# Patient Record
Sex: Female | Born: 1955 | Race: Black or African American | Hispanic: No | Marital: Married | Smoking: Former smoker
Health system: Southern US, Community
[De-identification: ages and names within clinical notes are randomized; demographics above are authoritative.]

## PROBLEM LIST (undated history)

## (undated) DIAGNOSIS — I10 Essential (primary) hypertension: Secondary | ICD-10-CM

## (undated) DIAGNOSIS — C539 Malignant neoplasm of cervix uteri, unspecified: Secondary | ICD-10-CM

## (undated) DIAGNOSIS — R1900 Intra-abdominal and pelvic swelling, mass and lump, unspecified site: Secondary | ICD-10-CM

## (undated) DIAGNOSIS — T7840XA Allergy, unspecified, initial encounter: Secondary | ICD-10-CM

## (undated) DIAGNOSIS — Z860100 Personal history of colon polyps, unspecified: Secondary | ICD-10-CM

## (undated) DIAGNOSIS — J309 Allergic rhinitis, unspecified: Secondary | ICD-10-CM

## (undated) DIAGNOSIS — N943 Premenstrual tension syndrome: Secondary | ICD-10-CM

## (undated) DIAGNOSIS — E785 Hyperlipidemia, unspecified: Secondary | ICD-10-CM

## (undated) HISTORY — PX: ABDOMINAL HYSTERECTOMY: SHX81

## (undated) HISTORY — PX: OTHER SURGICAL HISTORY: SHX169

## (undated) HISTORY — PX: COLONOSCOPY: SHX174

## (undated) HISTORY — DX: Personal history of colon polyps, unspecified: Z86.0100

## (undated) HISTORY — DX: Malignant neoplasm of cervix uteri, unspecified: C53.9

## (undated) HISTORY — DX: Premenstrual tension syndrome: N94.3

## (undated) HISTORY — DX: Essential (primary) hypertension: I10

## (undated) HISTORY — PX: POLYPECTOMY: SHX149

## (undated) HISTORY — DX: Hyperlipidemia, unspecified: E78.5

## (undated) HISTORY — DX: Personal history of colonic polyps: Z86.010

## (undated) HISTORY — PX: URETERAL REIMPLANTION: SHX2611

## (undated) HISTORY — DX: Allergy, unspecified, initial encounter: T78.40XA

---

## 1987-08-08 DIAGNOSIS — C539 Malignant neoplasm of cervix uteri, unspecified: Secondary | ICD-10-CM

## 1987-08-08 HISTORY — DX: Malignant neoplasm of cervix uteri, unspecified: C53.9

## 1999-04-13 ENCOUNTER — Other Ambulatory Visit: Admission: RE | Admit: 1999-04-13 | Discharge: 1999-04-13 | Payer: Self-pay | Admitting: Family Medicine

## 1999-05-16 ENCOUNTER — Encounter: Admission: RE | Admit: 1999-05-16 | Discharge: 1999-05-16 | Payer: Self-pay | Admitting: Family Medicine

## 2000-04-12 ENCOUNTER — Other Ambulatory Visit: Admission: RE | Admit: 2000-04-12 | Discharge: 2000-04-12 | Payer: Self-pay | Admitting: Family Medicine

## 2000-05-18 ENCOUNTER — Encounter: Admission: RE | Admit: 2000-05-18 | Discharge: 2000-05-18 | Payer: Self-pay | Admitting: Family Medicine

## 2000-05-18 ENCOUNTER — Encounter: Payer: Self-pay | Admitting: Family Medicine

## 2001-05-21 ENCOUNTER — Encounter: Payer: Self-pay | Admitting: Family Medicine

## 2001-05-21 ENCOUNTER — Encounter: Admission: RE | Admit: 2001-05-21 | Discharge: 2001-05-21 | Payer: Self-pay | Admitting: Family Medicine

## 2001-06-04 ENCOUNTER — Other Ambulatory Visit: Admission: RE | Admit: 2001-06-04 | Discharge: 2001-06-04 | Payer: Self-pay | Admitting: Family Medicine

## 2002-05-23 ENCOUNTER — Encounter: Admission: RE | Admit: 2002-05-23 | Discharge: 2002-05-23 | Payer: Self-pay | Admitting: Family Medicine

## 2002-05-23 ENCOUNTER — Encounter: Payer: Self-pay | Admitting: Family Medicine

## 2002-06-10 ENCOUNTER — Other Ambulatory Visit: Admission: RE | Admit: 2002-06-10 | Discharge: 2002-06-10 | Payer: Self-pay | Admitting: Family Medicine

## 2003-05-28 ENCOUNTER — Encounter: Payer: Self-pay | Admitting: Family Medicine

## 2003-05-28 ENCOUNTER — Encounter: Admission: RE | Admit: 2003-05-28 | Discharge: 2003-05-28 | Payer: Self-pay | Admitting: Family Medicine

## 2003-06-10 ENCOUNTER — Other Ambulatory Visit: Admission: RE | Admit: 2003-06-10 | Discharge: 2003-06-10 | Payer: Self-pay | Admitting: Family Medicine

## 2004-05-30 ENCOUNTER — Encounter: Admission: RE | Admit: 2004-05-30 | Discharge: 2004-05-30 | Payer: Self-pay | Admitting: Family Medicine

## 2004-06-13 ENCOUNTER — Ambulatory Visit: Payer: Self-pay | Admitting: Family Medicine

## 2004-06-20 ENCOUNTER — Other Ambulatory Visit: Admission: RE | Admit: 2004-06-20 | Discharge: 2004-06-20 | Payer: Self-pay | Admitting: Family Medicine

## 2004-06-20 ENCOUNTER — Ambulatory Visit: Payer: Self-pay | Admitting: Family Medicine

## 2004-06-21 ENCOUNTER — Encounter: Admission: RE | Admit: 2004-06-21 | Discharge: 2004-06-21 | Payer: Self-pay | Admitting: Family Medicine

## 2004-06-27 ENCOUNTER — Ambulatory Visit: Payer: Self-pay | Admitting: Family Medicine

## 2004-08-18 ENCOUNTER — Ambulatory Visit: Payer: Self-pay | Admitting: Family Medicine

## 2004-08-19 ENCOUNTER — Ambulatory Visit: Payer: Self-pay | Admitting: Gastroenterology

## 2004-09-01 ENCOUNTER — Ambulatory Visit: Payer: Self-pay | Admitting: Gastroenterology

## 2005-06-01 ENCOUNTER — Encounter: Admission: RE | Admit: 2005-06-01 | Discharge: 2005-06-01 | Payer: Self-pay | Admitting: Family Medicine

## 2005-10-10 ENCOUNTER — Ambulatory Visit: Payer: Self-pay | Admitting: Family Medicine

## 2005-10-17 ENCOUNTER — Other Ambulatory Visit: Admission: RE | Admit: 2005-10-17 | Discharge: 2005-10-17 | Payer: Self-pay | Admitting: Family Medicine

## 2005-10-17 ENCOUNTER — Encounter (INDEPENDENT_AMBULATORY_CARE_PROVIDER_SITE_OTHER): Payer: Self-pay | Admitting: Specialist

## 2005-10-17 ENCOUNTER — Ambulatory Visit: Payer: Self-pay | Admitting: Family Medicine

## 2005-11-20 ENCOUNTER — Ambulatory Visit: Payer: Self-pay | Admitting: Family Medicine

## 2005-11-27 ENCOUNTER — Ambulatory Visit: Payer: Self-pay | Admitting: Family Medicine

## 2005-12-25 ENCOUNTER — Emergency Department (HOSPITAL_COMMUNITY): Admission: EM | Admit: 2005-12-25 | Discharge: 2005-12-25 | Payer: Self-pay | Admitting: Emergency Medicine

## 2006-02-22 ENCOUNTER — Ambulatory Visit: Payer: Self-pay | Admitting: Family Medicine

## 2006-05-16 ENCOUNTER — Ambulatory Visit: Payer: Self-pay | Admitting: Family Medicine

## 2006-05-22 ENCOUNTER — Ambulatory Visit: Payer: Self-pay | Admitting: Licensed Clinical Social Worker

## 2006-05-23 ENCOUNTER — Ambulatory Visit: Payer: Self-pay | Admitting: Family Medicine

## 2006-06-04 ENCOUNTER — Encounter: Admission: RE | Admit: 2006-06-04 | Discharge: 2006-06-04 | Payer: Self-pay | Admitting: Family Medicine

## 2006-06-05 ENCOUNTER — Ambulatory Visit: Payer: Self-pay | Admitting: Licensed Clinical Social Worker

## 2006-06-07 ENCOUNTER — Ambulatory Visit: Payer: Self-pay | Admitting: Family Medicine

## 2006-10-19 ENCOUNTER — Ambulatory Visit: Payer: Self-pay | Admitting: Family Medicine

## 2006-10-19 LAB — CONVERTED CEMR LAB
Alkaline Phosphatase: 68 units/L (ref 39–117)
BUN: 14 mg/dL (ref 6–23)
Basophils Relative: 0.8 % (ref 0.0–1.0)
Bilirubin, Direct: 0.1 mg/dL (ref 0.0–0.3)
CO2: 27 meq/L (ref 19–32)
Cholesterol: 193 mg/dL (ref 0–200)
GFR calc Af Amer: 115 mL/min
HDL: 36.8 mg/dL — ABNORMAL LOW (ref >39.0)
Hemoglobin: 14.1 g/dL (ref 12.0–15.0)
Lymphocytes Relative: 36.1 % (ref 12.0–46.0)
MCHC: 34.1 g/dL (ref 30.0–36.0)
Monocytes Absolute: 0.4 10*3/uL (ref 0.2–0.7)
Monocytes Relative: 5.3 % (ref 3.0–11.0)
Neutro Abs: 4.1 10*3/uL (ref 1.4–7.7)
Potassium: 3.8 meq/L (ref 3.5–5.1)
Sodium: 143 meq/L (ref 135–145)
TSH: 0.86 microintl units/mL (ref 0.35–5.50)
Total Protein: 6.7 g/dL (ref 6.0–8.3)
VLDL: 30 mg/dL (ref 0–40)

## 2006-12-03 ENCOUNTER — Ambulatory Visit: Payer: Self-pay | Admitting: Family Medicine

## 2007-05-14 DIAGNOSIS — J309 Allergic rhinitis, unspecified: Secondary | ICD-10-CM

## 2007-05-14 DIAGNOSIS — E785 Hyperlipidemia, unspecified: Secondary | ICD-10-CM | POA: Insufficient documentation

## 2007-05-14 DIAGNOSIS — I10 Essential (primary) hypertension: Secondary | ICD-10-CM | POA: Insufficient documentation

## 2007-05-14 HISTORY — DX: Allergic rhinitis, unspecified: J30.9

## 2007-06-10 ENCOUNTER — Encounter: Admission: RE | Admit: 2007-06-10 | Discharge: 2007-06-10 | Payer: Self-pay | Admitting: Family Medicine

## 2007-11-26 ENCOUNTER — Ambulatory Visit: Payer: Self-pay | Admitting: Family Medicine

## 2007-11-26 LAB — CONVERTED CEMR LAB
Basophils Absolute: 0 10*3/uL (ref 0.0–0.1)
Basophils Relative: 0.2 % (ref 0.0–1.0)
Bilirubin Urine: NEGATIVE
Bilirubin, Direct: 0.1 mg/dL (ref 0.0–0.3)
Calcium: 9.7 mg/dL (ref 8.4–10.5)
Cholesterol: 233 mg/dL (ref 0–200)
Creatinine, Ser: 0.8 mg/dL (ref 0.4–1.2)
Direct LDL: 136.7 mg/dL
GFR calc Af Amer: 98 mL/min
Glucose, Urine, Semiquant: NEGATIVE
HCT: 43.6 % (ref 36.0–46.0)
Hemoglobin: 14.8 g/dL (ref 12.0–15.0)
MCHC: 33.9 g/dL (ref 30.0–36.0)
MCV: 91.2 fL (ref 78.0–100.0)
Monocytes Absolute: 0.5 10*3/uL (ref 0.1–1.0)
Neutro Abs: 4.8 10*3/uL (ref 1.4–7.7)
Protein, U semiquant: NEGATIVE
RDW: 12.9 % (ref 11.5–14.6)
Sodium: 141 meq/L (ref 135–145)
Specific Gravity, Urine: 1.02
TSH: 1.39 microintl units/mL (ref 0.35–5.50)
Total Bilirubin: 0.9 mg/dL (ref 0.3–1.2)
Total Protein: 7.7 g/dL (ref 6.0–8.3)
pH: 7

## 2007-12-05 ENCOUNTER — Ambulatory Visit: Payer: Self-pay | Admitting: Family Medicine

## 2008-04-06 ENCOUNTER — Telehealth (INDEPENDENT_AMBULATORY_CARE_PROVIDER_SITE_OTHER): Payer: Self-pay | Admitting: *Deleted

## 2008-06-10 ENCOUNTER — Encounter: Admission: RE | Admit: 2008-06-10 | Discharge: 2008-06-10 | Payer: Self-pay | Admitting: Family Medicine

## 2008-11-30 ENCOUNTER — Ambulatory Visit: Payer: Self-pay | Admitting: Family Medicine

## 2008-11-30 LAB — CONVERTED CEMR LAB
AST: 21 units/L (ref 0–37)
Albumin: 3.8 g/dL (ref 3.5–5.2)
BUN: 13 mg/dL (ref 6–23)
Basophils Absolute: 0.1 10*3/uL (ref 0.0–0.1)
Blood in Urine, dipstick: NEGATIVE
CO2: 26 meq/L (ref 19–32)
Chloride: 112 meq/L (ref 96–112)
Direct LDL: 127.9 mg/dL
Eosinophils Absolute: 0.1 10*3/uL (ref 0.0–0.7)
GFR calc non Af Amer: 113.76 mL/min (ref >60)
Glucose, Bld: 88 mg/dL (ref 70–99)
Glucose, Urine, Semiquant: NEGATIVE
HCT: 39.9 % (ref 36.0–46.0)
HDL: 34.8 mg/dL — ABNORMAL LOW (ref >39.00)
Hemoglobin: 13.5 g/dL (ref 12.0–15.0)
Lymphs Abs: 2.2 10*3/uL (ref 0.7–4.0)
MCHC: 33.9 g/dL (ref 30.0–36.0)
MCV: 92.7 fL (ref 78.0–100.0)
Monocytes Absolute: 0.4 10*3/uL (ref 0.1–1.0)
Monocytes Relative: 6.5 % (ref 3.0–12.0)
Neutro Abs: 4 10*3/uL (ref 1.4–7.7)
Nitrite: NEGATIVE
Platelets: 244 10*3/uL (ref 150.0–400.0)
Potassium: 3.8 meq/L (ref 3.5–5.1)
RDW: 13 % (ref 11.5–14.6)
Sodium: 143 meq/L (ref 135–145)
Specific Gravity, Urine: 1.02
TSH: 0.56 microintl units/mL (ref 0.35–5.50)
Total Bilirubin: 0.6 mg/dL (ref 0.3–1.2)
WBC Urine, dipstick: NEGATIVE
pH: 5

## 2008-12-07 ENCOUNTER — Ambulatory Visit: Payer: Self-pay | Admitting: Family Medicine

## 2009-02-24 ENCOUNTER — Ambulatory Visit: Payer: Self-pay | Admitting: Internal Medicine

## 2009-02-24 DIAGNOSIS — Z860100 Personal history of colon polyps, unspecified: Secondary | ICD-10-CM | POA: Insufficient documentation

## 2009-02-24 DIAGNOSIS — R12 Heartburn: Secondary | ICD-10-CM

## 2009-04-01 ENCOUNTER — Ambulatory Visit: Payer: Self-pay | Admitting: Internal Medicine

## 2009-06-11 ENCOUNTER — Encounter: Admission: RE | Admit: 2009-06-11 | Discharge: 2009-06-11 | Payer: Self-pay | Admitting: Family Medicine

## 2009-12-08 ENCOUNTER — Ambulatory Visit: Payer: Self-pay | Admitting: Family Medicine

## 2009-12-08 LAB — CONVERTED CEMR LAB
ALT: 44 units/L — ABNORMAL HIGH (ref 0–35)
AST: 37 units/L (ref 0–37)
BUN: 13 mg/dL (ref 6–23)
Basophils Relative: 0.2 % (ref 0.0–3.0)
Bilirubin, Direct: 0.1 mg/dL (ref 0.0–0.3)
Chloride: 110 meq/L (ref 96–112)
Direct LDL: 119.1 mg/dL
Eosinophils Relative: 2.5 % (ref 0.0–5.0)
GFR calc non Af Amer: 97.11 mL/min (ref >60)
HCT: 41 % (ref 36.0–46.0)
Leukocytes, UA: NEGATIVE
Lymphs Abs: 2 10*3/uL (ref 0.7–4.0)
Monocytes Relative: 6.3 % (ref 3.0–12.0)
Neutrophils Relative %: 64.6 % (ref 43.0–77.0)
Nitrite: NEGATIVE
Platelets: 259 10*3/uL (ref 150.0–400.0)
Potassium: 4.2 meq/L (ref 3.5–5.1)
RBC: 4.54 M/uL (ref 3.87–5.11)
Specific Gravity, Urine: >=1.03 (ref 1.000–1.030)
Total Bilirubin: 0.5 mg/dL (ref 0.3–1.2)
Total CHOL/HDL Ratio: 6
Total Protein: 6.9 g/dL (ref 6.0–8.3)
Urobilinogen, UA: 0.2 (ref 0.0–1.0)
VLDL: 43.6 mg/dL — ABNORMAL HIGH (ref 0.0–40.0)
WBC: 7.6 10*3/uL (ref 4.5–10.5)

## 2009-12-14 ENCOUNTER — Ambulatory Visit: Payer: Self-pay | Admitting: Family Medicine

## 2010-06-17 ENCOUNTER — Encounter: Admission: RE | Admit: 2010-06-17 | Discharge: 2010-06-17 | Payer: Self-pay | Admitting: Family Medicine

## 2010-09-06 NOTE — Assessment & Plan Note (Signed)
Summary: cpx/pap/cjr   Vital Signs:  Patient profile:   55 year old female Menstrual status:  hysterectomy Height:      63.5 inches Weight:      149 pounds BMI:     26.07 Temp:     97.7 degrees F oral BP sitting:   140 / 92  (left arm) Cuff size:   regular  Vitals Entered By: Kern Reap CMA Duncan Dull) (Dec 14, 2009 9:39 AM) CC: cpx   Primary Care Provider:  Kelle Darting, MD  CC:  cpx.  History of Present Illness: Anita Whitehead is a 55 year old female, nonsmoker, who comes in today for general physical examination  She has a history of underlying allergic rhinitis, for which he takes Zyrtec 10 mg nightly.  She also has some reflux esophagitis, for which he takes Zantac 150 mg b.i.d. p.r.n.  Future [place], care, and dental care, does BSE monthly.  Gets annual mammography.  Colonoscopy normal at age 30.  Tetanus 2008 declines seasonal flu shot.  Allergies: No Known Drug Allergies  Past History:  Past medical, surgical, family and social histories (including risk factors) reviewed, and no changes noted (except as noted below).  Past Medical History: Reviewed history from 02/24/2009 and no changes required. PMS Allergic rhinitis Hyperlipidemia Hypertension TAH/BSO for nonmalignant reasons childbirth x 2 Adenomatous Colon Polyps  Past Surgical History: Reviewed history from 05/14/2007 and no changes required. TAH/BSO CB x2 Colonoscopy  Family History: Reviewed history from 02/24/2009 and no changes required. Family History Breast cancer 1st degree relative <50 Family History Diabetes 1st degree relative Family History Hypertension Family History Lung cancer Family History Uterine cancer Family History of Respiratory disease No FH of Colon Cancer:  Social History: Reviewed history from 02/24/2009 and no changes required. Occupation:Social Services  Married Never Smoked Alcohol use-no Regular exercise-yes one son and a [place] in jail for drug issues.  Due to get out  a year plus.  Other son in college playing soccer  Review of Systems      See HPI  Physical Exam  General:  Well-developed,well-nourished,in no acute distress; alert,appropriate and cooperative throughout examination Head:  Normocephalic and atraumatic without obvious abnormalities. No apparent alopecia or balding. Eyes:  No corneal or conjunctival inflammation noted. EOMI. Perrla. Funduscopic exam benign, without hemorrhages, exudates or papilledema. Vision grossly normal. Ears:  External ear exam shows no significant lesions or deformities.  Otoscopic examination reveals clear canals, tympanic membranes are intact bilaterally without bulging, retraction, inflammation or discharge. Hearing is grossly normal bilaterally. Nose:  External nasal examination shows no deformity or inflammation. Nasal mucosa are pink and moist without lesions or exudates. Mouth:  Oral mucosa and oropharynx without lesions or exudates.  Teeth in good repair. Neck:  No deformities, masses, or tenderness noted. Chest Wall:  No deformities, masses, or tenderness noted. Breasts:  No mass, nodules, thickening, tenderness, bulging, retraction, inflamation, nipple discharge or skin changes noted.   Lungs:  Normal respiratory effort, chest expands symmetrically. Lungs are clear to auscultation, no crackles or wheezes. Heart:  Normal rate and regular rhythm. S1 and S2 normal without gallop, murmur, click, rub or other extra sounds. Abdomen:  Bowel sounds positive,abdomen soft and non-tender without masses, organomegaly or hernias noted. Rectal:  No external abnormalities noted. Normal sphincter tone. No rectal masses or tenderness. Genitalia:  Pelvic Exam:        External: normal female genitalia without lesions or masses        Vagina: normal without lesions or masses  Cervix: normal without lesions or masses        Adnexa: normal bimanual exam without masses or fullness        Uterus: normal by palpation        Pap  smear: not performed Msk:  No deformity or scoliosis noted of thoracic or lumbar spine.   Pulses:  R and L carotid,radial,femoral,dorsalis pedis and posterior tibial pulses are full and equal bilaterally Extremities:  No clubbing, cyanosis, edema, or deformity noted with normal full range of motion of all joints.   Neurologic:  No cranial nerve deficits noted. Station and gait are normal. Plantar reflexes are down-going bilaterally. DTRs are symmetrical throughout. Sensory, motor and coordinative functions appear intact. Skin:  Intact without suspicious lesions or rashes Cervical Nodes:  No lymphadenopathy noted Axillary Nodes:  No palpable lymphadenopathy Inguinal Nodes:  No significant adenopathy Psych:  Cognition and judgment appear intact. Alert and cooperative with normal attention span and concentration. No apparent delusions, illusions, hallucinations   Impression & Recommendations:  Problem # 1:  Preventive Health Care (ICD-V70.0) Assessment Unchanged  Complete Medication List: 1)  Adult Aspirin Low Strength 81 Mg Tbdp (Aspirin) .... Take one tablet daily 2)  Zyrtec Allergy 10 Mg Tabs (Cetirizine hcl) .... Once daily 3)  Ranitidine Hcl 150 Mg Tabs (Ranitidine hcl) .Marland Kitchen.. 1 by mouth once daily to two times a day for heartburn  Patient Instructions: 1)  Please schedule a follow-up appointment in 1 year. Prescriptions: RANITIDINE HCL 150 MG  TABS (RANITIDINE HCL) 1 by mouth once daily to two times a day for heartburn  #100 x 3   Entered and Authorized by:   Roderick Pee MD   Signed by:   Roderick Pee MD on 12/14/2009   Method used:   Print then Give to Patient   RxID:   [id number]

## 2010-12-12 ENCOUNTER — Other Ambulatory Visit (INDEPENDENT_AMBULATORY_CARE_PROVIDER_SITE_OTHER): Admitting: Family Medicine

## 2010-12-12 ENCOUNTER — Other Ambulatory Visit (INDEPENDENT_AMBULATORY_CARE_PROVIDER_SITE_OTHER): Payer: 59

## 2010-12-12 ENCOUNTER — Other Ambulatory Visit: Payer: Self-pay | Admitting: Family Medicine

## 2010-12-12 DIAGNOSIS — Z0000 Encounter for general adult medical examination without abnormal findings: Secondary | ICD-10-CM

## 2010-12-12 DIAGNOSIS — Z13220 Encounter for screening for lipoid disorders: Secondary | ICD-10-CM

## 2010-12-12 LAB — URINALYSIS
Hgb urine dipstick: NEGATIVE
Total Protein, Urine: NEGATIVE
Urine Glucose: NEGATIVE

## 2010-12-12 LAB — CBC WITH DIFFERENTIAL/PLATELET
Basophils Absolute: 0 10*3/uL (ref 0.0–0.1)
HCT: 43.8 % (ref 36.0–46.0)
Lymphs Abs: 1.8 10*3/uL (ref 0.7–4.0)
MCV: 90.8 fl (ref 78.0–100.0)
Monocytes Absolute: 0.5 10*3/uL (ref 0.1–1.0)
Monocytes Relative: 5.9 % (ref 3.0–12.0)
Platelets: 256 10*3/uL (ref 150.0–400.0)
RDW: 14 % (ref 11.5–14.6)

## 2010-12-12 LAB — BASIC METABOLIC PANEL
Calcium: 9.6 mg/dL (ref 8.4–10.5)
GFR: 120.77 mL/min (ref >60.00)
Glucose, Bld: 84 mg/dL (ref 70–99)
Sodium: 140 mEq/L (ref 135–145)

## 2010-12-12 LAB — LIPID PANEL
Cholesterol: 248 mg/dL — ABNORMAL HIGH (ref 0–200)
HDL: 40.2 mg/dL (ref >39.00)
Triglycerides: 238 mg/dL — ABNORMAL HIGH (ref 0.0–149.0)

## 2010-12-12 LAB — HEPATIC FUNCTION PANEL
ALT: 27 U/L (ref 0–35)
AST: 18 U/L (ref 0–37)
Total Bilirubin: 0.7 mg/dL (ref 0.3–1.2)

## 2010-12-12 LAB — TSH: TSH: 1.03 u[IU]/mL (ref 0.35–5.50)

## 2010-12-12 LAB — LDL CHOLESTEROL, DIRECT: Direct LDL: 161 mg/dL

## 2010-12-16 ENCOUNTER — Encounter: Payer: Self-pay | Admitting: Family Medicine

## 2010-12-19 ENCOUNTER — Encounter: Payer: Self-pay | Admitting: Family Medicine

## 2010-12-19 ENCOUNTER — Ambulatory Visit (INDEPENDENT_AMBULATORY_CARE_PROVIDER_SITE_OTHER): Admitting: Family Medicine

## 2010-12-19 VITALS — BP 120/84 | Temp 97.7°F | Ht 63.5 in | Wt 151.0 lb

## 2010-12-19 DIAGNOSIS — N6320 Unspecified lump in the left breast, unspecified quadrant: Secondary | ICD-10-CM

## 2010-12-19 DIAGNOSIS — N63 Unspecified lump in unspecified breast: Secondary | ICD-10-CM

## 2010-12-19 NOTE — Progress Notes (Signed)
  Subjective:    Patient ID: Anita Whitehead, female    DOB: [birth year], 55 y.o.   MRN: [id number]  HPI  Anita Whitehead Is a delightful, 55 year old female, nonsmoker comes in today for general medical examination.  She said a history of borderline hypertension.  BP today 120/84.  She's had a history of borderline hyperlipidemia.  Lipids are at goal.  The only medicine she takes is an aspirin tablet and Zyrtec 10 mg nightly for allergic rhinitis.  She's had her uterus and ovaries removed for nonmalignant reasons.  Childbirth x 2.  One son is in business school at [employer]. The other son Anita Whitehead is soon to get out of jail in [place] in the next 6 months.  She says she feels well.  No problems.  She gets routine eye care, dental care, BSE monthly, and mammography, second, colonoscopy 2000 and because of a history of colon polyps, tetanus, 2008.    Review of Systems  Constitutional: Negative.   HENT: Negative.   Eyes: Negative.   Respiratory: Negative.   Cardiovascular: Negative.   Gastrointestinal: Negative.   Genitourinary: Negative.   Musculoskeletal: Negative.   Neurological: Negative.   Hematological: Negative.   Psychiatric/Behavioral: Negative.        Objective:   Physical Exam  Constitutional: She appears well-developed and well-nourished.  HENT:  Head: Normocephalic and atraumatic.  Right Ear: External ear normal.  Left Ear: External ear normal.  Nose: Nose normal.  Mouth/Throat: Oropharynx is clear and moist.  Eyes: EOM are normal. Pupils are equal, round, and reactive to light.  Neck: Normal range of motion. Neck supple. No thyromegaly present.  Cardiovascular: Normal rate, regular rhythm, normal heart sounds and intact distal pulses.  Exam reveals no gallop and no friction rub.   No murmur heard. Pulmonary/Chest: Effort normal and breath sounds normal.  Abdominal: Soft. Bowel sounds are normal. She exhibits no distension and no mass. There is no tenderness. There is no  rebound.  Genitourinary: Vagina normal and uterus normal. Guaiac negative stool. No vaginal discharge found.       Bilateral breast exam shows the right breast to be normal.  There is a 6-mm times 6-mm, soft, movable cystic lesion on the left breast at 4 o'clock, right next to the nipple.  It is soft to rubbery movable.  Parenthetically, a sister, age 64, with recently diagnosed with breast cancer.  Mother had a lumpectomy.  No cancer  Musculoskeletal: Normal range of motion.  Lymphadenopathy:    She has no cervical adenopathy.  Neurological: She is alert. She has normal reflexes. No cranial nerve deficit. She exhibits normal muscle tone. Coordination normal.  Skin: Skin is warm and dry.  Psychiatric: She has a normal mood and affect. Her behavior is normal. Judgment and thought content normal.          Assessment & Plan:  Healthy female.  Allergic rhinitis.  Continue Zyrtec 10 mg nightly  Breast lump, left, was set up for ultrasound for diagnostic evaluation

## 2010-12-19 NOTE — Patient Instructions (Signed)
Continued good health habits.  Member to walk daily and avoid salt.  We will set her up for an ultrasound for evaluation of the cystic lesion in your left breast.  Follow-up in one year or sooner if any problems

## 2010-12-20 ENCOUNTER — Other Ambulatory Visit: Payer: Self-pay | Admitting: Family Medicine

## 2010-12-23 ENCOUNTER — Ambulatory Visit
Admission: RE | Admit: 2010-12-23 | Discharge: 2010-12-23 | Disposition: A | Discharge status: Home / Self Care | Source: Ambulatory Visit | Attending: Family Medicine | Admitting: Family Medicine

## 2011-06-08 ENCOUNTER — Other Ambulatory Visit: Payer: Self-pay | Admitting: Family Medicine

## 2011-06-08 DIAGNOSIS — Z1231 Encounter for screening mammogram for malignant neoplasm of breast: Secondary | ICD-10-CM

## 2011-06-21 ENCOUNTER — Ambulatory Visit
Admission: RE | Admit: 2011-06-21 | Discharge: 2011-06-21 | Disposition: A | Discharge status: Home / Self Care | Payer: 59 | Source: Ambulatory Visit | Attending: Family Medicine | Admitting: Family Medicine

## 2011-06-21 DIAGNOSIS — Z1231 Encounter for screening mammogram for malignant neoplasm of breast: Secondary | ICD-10-CM

## 2012-03-26 ENCOUNTER — Other Ambulatory Visit (INDEPENDENT_AMBULATORY_CARE_PROVIDER_SITE_OTHER): Payer: 59

## 2012-03-26 DIAGNOSIS — Z0000 Encounter for general adult medical examination without abnormal findings: Secondary | ICD-10-CM

## 2012-03-26 DIAGNOSIS — Z13220 Encounter for screening for lipoid disorders: Secondary | ICD-10-CM

## 2012-03-26 LAB — POCT URINALYSIS DIPSTICK
Bilirubin, UA: NEGATIVE
Glucose, UA: NEGATIVE
Leukocytes, UA: NEGATIVE
Nitrite, UA: NEGATIVE

## 2012-03-26 LAB — CBC WITH DIFFERENTIAL/PLATELET
Basophils Absolute: 0 10*3/uL (ref 0.0–0.1)
Lymphocytes Relative: 23.8 % (ref 12.0–46.0)
Lymphs Abs: 2.3 10*3/uL (ref 0.7–4.0)
Monocytes Relative: 4.6 % (ref 3.0–12.0)
Platelets: 250 10*3/uL (ref 150.0–400.0)
RDW: 13.9 % (ref 11.5–14.6)

## 2012-03-26 LAB — HEPATIC FUNCTION PANEL
AST: 23 U/L (ref 0–37)
Alkaline Phosphatase: 85 U/L (ref 39–117)
Total Bilirubin: 0.7 mg/dL (ref 0.3–1.2)

## 2012-03-26 LAB — BASIC METABOLIC PANEL
BUN: 9 mg/dL (ref 6–23)
Calcium: 9.7 mg/dL (ref 8.4–10.5)
GFR: 96.26 mL/min (ref >60.00)
Glucose, Bld: 98 mg/dL (ref 70–99)

## 2012-03-26 LAB — LIPID PANEL
Cholesterol: 225 mg/dL — ABNORMAL HIGH (ref 0–200)
Total CHOL/HDL Ratio: 5

## 2012-03-26 LAB — LDL CHOLESTEROL, DIRECT: Direct LDL: 127.2 mg/dL

## 2012-04-02 ENCOUNTER — Encounter: Payer: Self-pay | Admitting: Family Medicine

## 2012-04-02 ENCOUNTER — Ambulatory Visit (INDEPENDENT_AMBULATORY_CARE_PROVIDER_SITE_OTHER): Payer: 59 | Admitting: Family Medicine

## 2012-04-02 VITALS — BP 124/78 | Temp 98.2°F | Ht 63.25 in | Wt 146.0 lb

## 2012-04-02 DIAGNOSIS — N952 Postmenopausal atrophic vaginitis: Secondary | ICD-10-CM

## 2012-04-02 DIAGNOSIS — R12 Heartburn: Secondary | ICD-10-CM

## 2012-04-02 DIAGNOSIS — Z0000 Encounter for general adult medical examination without abnormal findings: Secondary | ICD-10-CM

## 2012-04-02 MED ORDER — ESTRADIOL 0.1 MG/GM VA CREA: 2.0000 g | TOPICAL_CREAM | Freq: Every day | VAGINAL | Status: AC

## 2012-04-02 NOTE — Patient Instructions (Signed)
Use small amounts of a hormonal cream twice weekly  Continue other medications  Followup in 1 year sooner if any problems

## 2012-04-02 NOTE — Progress Notes (Signed)
  Subjective:    Patient ID: Anita Whitehead, female    DOB: October 25, 1955, 56 y.o.   MRN: [id number]  HPI Anita Whitehead is a 56 year old female who comes in today for general physical examination  She takes Zantac 150 mg twice a day for reflux esophagitis and a baby aspirin for cardiac protection  She gets routine eye care, dental care, BSE monthly, and you mammography, colonoscopy x3 in GI, tetanus 2008   Review of Systems  Constitutional: Negative.   HENT: Negative.   Eyes: Negative.   Respiratory: Negative.   Cardiovascular: Negative.   Gastrointestinal: Negative.   Genitourinary: Negative.   Musculoskeletal: Negative.   Neurological: Negative.   Hematological: Negative.   Psychiatric/Behavioral: Negative.        Objective:   Physical Exam  Constitutional: She appears well-developed and well-nourished.  HENT:  Head: Normocephalic and atraumatic.  Right Ear: External ear normal.  Left Ear: External ear normal.  Nose: Nose normal.  Mouth/Throat: Oropharynx is clear and moist.  Eyes: EOM are normal. Pupils are equal, round, and reactive to light.  Neck: Normal range of motion. Neck supple. No thyromegaly present.  Cardiovascular: Normal rate, regular rhythm, normal heart sounds and intact distal pulses.  Exam reveals no gallop and no friction rub.   No murmur heard. Pulmonary/Chest: Effort normal and breath sounds normal.  Abdominal: Soft. Bowel sounds are normal. She exhibits no distension and no mass. There is no tenderness. There is no rebound.  Genitourinary: Vagina normal. Guaiac negative stool. No vaginal discharge found.       Bilateral breast exam normal extremely dry vagina  Musculoskeletal: Normal range of motion.  Lymphadenopathy:    She has no cervical adenopathy.  Neurological: She is alert. She has normal reflexes. No cranial nerve deficit. She exhibits normal muscle tone. Coordination normal.  Skin: Skin is warm and dry.  Psychiatric: She has a normal mood and  affect. Her behavior is normal. Judgment and thought content normal.          Assessment & Plan:  Healthy female  Reflux esophagitis continue OTC Zantac  Atrophic vaginitis hormonal cream twice weekly return in one year sooner if any problems

## 2012-05-14 ENCOUNTER — Other Ambulatory Visit: Payer: Self-pay | Admitting: Family Medicine

## 2012-05-14 DIAGNOSIS — Z1231 Encounter for screening mammogram for malignant neoplasm of breast: Secondary | ICD-10-CM

## 2012-06-21 ENCOUNTER — Ambulatory Visit
Admission: RE | Admit: 2012-06-21 | Discharge: 2012-06-21 | Disposition: A | Discharge status: Home / Self Care | Payer: 59 | Source: Ambulatory Visit | Attending: Family Medicine | Admitting: Family Medicine

## 2012-06-21 DIAGNOSIS — Z1231 Encounter for screening mammogram for malignant neoplasm of breast: Secondary | ICD-10-CM

## 2013-03-27 ENCOUNTER — Other Ambulatory Visit (INDEPENDENT_AMBULATORY_CARE_PROVIDER_SITE_OTHER): Payer: 59

## 2013-03-27 DIAGNOSIS — Z0000 Encounter for general adult medical examination without abnormal findings: Secondary | ICD-10-CM

## 2013-03-27 LAB — POCT URINALYSIS DIPSTICK
Glucose, UA: NEGATIVE
Nitrite, UA: NEGATIVE
Protein, UA: NEGATIVE
Spec Grav, UA: 1.02
Urobilinogen, UA: 0.2

## 2013-03-27 LAB — CBC WITH DIFFERENTIAL/PLATELET
Eosinophils Relative: 1.1 % (ref 0.0–5.0)
HCT: 41.9 % (ref 36.0–46.0)
Hemoglobin: 14.4 g/dL (ref 12.0–15.0)
Lymphs Abs: 2.5 10*3/uL (ref 0.7–4.0)
Monocytes Relative: 5.3 % (ref 3.0–12.0)
Neutro Abs: 5.6 10*3/uL (ref 1.4–7.7)
WBC: 8.7 10*3/uL (ref 4.5–10.5)

## 2013-03-27 LAB — BASIC METABOLIC PANEL
GFR: 110.05 mL/min (ref >60.00)
Potassium: 3.9 mEq/L (ref 3.5–5.1)
Sodium: 137 mEq/L (ref 135–145)

## 2013-03-27 LAB — TSH: TSH: 0.75 u[IU]/mL (ref 0.35–5.50)

## 2013-03-27 LAB — LIPID PANEL
HDL: 42.8 mg/dL (ref >39.00)
VLDL: 34.8 mg/dL (ref 0.0–40.0)

## 2013-03-27 LAB — HEPATIC FUNCTION PANEL
ALT: 24 U/L (ref 0–35)
AST: 21 U/L (ref 0–37)
Alkaline Phosphatase: 68 U/L (ref 39–117)
Total Bilirubin: 0.7 mg/dL (ref 0.3–1.2)

## 2013-04-01 ENCOUNTER — Encounter: Payer: Self-pay | Admitting: Family

## 2013-04-01 ENCOUNTER — Ambulatory Visit (INDEPENDENT_AMBULATORY_CARE_PROVIDER_SITE_OTHER): Payer: 59 | Admitting: Family

## 2013-04-01 VITALS — BP 120/80 | HR 83 | Ht 63.5 in | Wt 143.0 lb

## 2013-04-01 DIAGNOSIS — Z23 Encounter for immunization: Secondary | ICD-10-CM

## 2013-04-01 DIAGNOSIS — Z0000 Encounter for general adult medical examination without abnormal findings: Secondary | ICD-10-CM

## 2013-04-01 MED ORDER — RANITIDINE HCL 300 MG PO TABS: 300.0000 mg | ORAL_TABLET | Freq: Every day | ORAL | Status: DC

## 2013-04-01 NOTE — Progress Notes (Signed)
Subjective:    Patient ID: Anita Whitehead, female    DOB: Mar 19, 1956, 57 y.o.   MRN: [id number]  HPI 57 year old AAF, nonsmoker, patient of Dr. Tawanna Cooler is in for pphysical examination for this healthy  Female. Reviewed all health maintenance protocols including mammography colonoscopy bone density and reviewed appropriate screening labs. Her immunization history was reviewed as well as her current medications and allergies refills of her chronic medications were given and the plan for yearly health maintenance was discussed all orders and referrals were made as appropriate.   Review of Systems  Constitutional: Negative.   HENT: Negative.   Eyes: Negative.   Respiratory: Negative.   Cardiovascular: Negative.   Gastrointestinal: Negative.   Endocrine: Negative.   Genitourinary: Negative.   Musculoskeletal: Negative.   Skin: Negative.   Allergic/Immunologic: Negative.   Neurological: Negative.   Hematological: Negative.   Psychiatric/Behavioral: Negative.    Past Medical History  Diagnosis Date  . PMS (premenstrual syndrome)   . Allergy   . Hyperlipidemia   . Hypertension     History   Social History  . Marital Status: Married    Spouse Name: N/A    Number of Children: N/A  . Years of Education: N/A   Occupational History  . Not on file.   Social History Main Topics  . Smoking status: Never Smoker   . Smokeless tobacco: Not on file  . Alcohol Use:   . Drug Use:   . Sexual Activity:    Other Topics Concern  . Not on file   Social History Narrative  . No narrative on file    Past Surgical History  Procedure Laterality Date  . Abdominal hysterectomy    . Adenomatous colon polyps      Family History  Problem Relation Age of Onset  . Cancer Other     breast,lung,uterine,colon  . Diabetes Other   . Hypertension Other   . Lung disease Other     No Known Allergies  Current Outpatient Prescriptions on File Prior to Visit  Medication Sig Dispense Refill   . aspirin 81 MG tablet Take 81 mg by mouth daily.        Marland Kitchen estradiol (ESTRACE VAGINAL) 0.1 MG/GM vaginal cream Place 0.25 Applicatorfuls vaginally daily.  42.5 g  12   No current facility-administered medications on file prior to visit.    BP 120/80  Pulse 83  Ht 5' 3.5" (1.613 m)  Wt 143 lb (64.864 kg)  BMI 24.93 kg/m2chart    Objective:   Physical Exam  Constitutional: She is oriented to person, place, and time. She appears well-developed and well-nourished.  HENT:  Right Ear: External ear normal.  Left Ear: External ear normal.  Nose: Nose normal.  Mouth/Throat: Oropharynx is clear and moist.  Eyes: Conjunctivae and EOM are normal. Pupils are equal, round, and reactive to light.  Neck: Normal range of motion. Neck supple. No thyromegaly present.  Cardiovascular: Normal rate, regular rhythm and normal heart sounds.   Pulmonary/Chest: Effort normal and breath sounds normal.  Abdominal: Soft. Bowel sounds are normal. She exhibits no distension. There is no tenderness. There is no rebound.  Musculoskeletal: She exhibits edema.  Neurological: She is alert and oriented to person, place, and time. She has normal reflexes. She displays normal reflexes. No cranial nerve deficit. Coordination normal.  Skin: Skin is warm and dry. No erythema.  Psychiatric: She has a normal mood and affect.  Assessment & Plan:  Assessment: 1. CPX 2. Hypercholesterolemia  Plan: Encouraged healthy, exercise, monthly self breast exams. Low cholesterol diet. Follow up in one year and sooner as needed.

## 2013-04-01 NOTE — Patient Instructions (Addendum)

## 2013-06-13 ENCOUNTER — Other Ambulatory Visit: Payer: Self-pay

## 2013-06-13 DIAGNOSIS — Z1231 Encounter for screening mammogram for malignant neoplasm of breast: Secondary | ICD-10-CM

## 2013-07-10 ENCOUNTER — Ambulatory Visit
Admission: RE | Admit: 2013-07-10 | Discharge: 2013-07-10 | Disposition: A | Discharge status: Home / Self Care | Payer: 59 | Source: Ambulatory Visit

## 2013-07-10 DIAGNOSIS — Z1231 Encounter for screening mammogram for malignant neoplasm of breast: Secondary | ICD-10-CM

## 2013-10-01 ENCOUNTER — Telehealth: Payer: Self-pay | Admitting: Family Medicine

## 2013-10-01 NOTE — Telephone Encounter (Signed)
Fax was received denying ranitidine, medication is excluded from patient's plan.  I called to see if there were any alternatives available and I was advised that there wasn't anything listed and for the patient to call member services on the back of their card.

## 2013-10-01 NOTE — Telephone Encounter (Signed)
Patient will call insurance company

## 2013-12-14 ENCOUNTER — Ambulatory Visit (INDEPENDENT_AMBULATORY_CARE_PROVIDER_SITE_OTHER): Payer: 59 | Admitting: Physician Assistant

## 2013-12-14 VITALS — BP 130/80 | HR 96 | Temp 98.6°F | Resp 16 | Ht 63.0 in | Wt 143.0 lb

## 2013-12-14 DIAGNOSIS — R05 Cough: Secondary | ICD-10-CM

## 2013-12-14 DIAGNOSIS — R059 Cough, unspecified: Secondary | ICD-10-CM

## 2013-12-14 DIAGNOSIS — J3489 Other specified disorders of nose and nasal sinuses: Secondary | ICD-10-CM

## 2013-12-14 DIAGNOSIS — J329 Chronic sinusitis, unspecified: Secondary | ICD-10-CM

## 2013-12-14 DIAGNOSIS — R0981 Nasal congestion: Secondary | ICD-10-CM

## 2013-12-14 MED ORDER — AMOXICILLIN 875 MG PO TABS: 875.0000 mg | ORAL_TABLET | Freq: Two times a day (BID) | ORAL | Status: DC

## 2013-12-14 MED ORDER — IPRATROPIUM BROMIDE 0.03 % NA SOLN: 2.0000 | Freq: Two times a day (BID) | NASAL | Status: DC

## 2013-12-14 MED ORDER — HYDROCODONE-HOMATROPINE 5-1.5 MG/5ML PO SYRP: 5.0000 mL | ORAL_SOLUTION | Freq: Three times a day (TID) | ORAL | Status: DC | PRN

## 2013-12-14 NOTE — Progress Notes (Signed)
   Subjective:    Patient ID: Graylin Shiver, female    DOB: 1955/08/13, 58 y.o.   MRN: [id number]  HPI 58 year old female presents for evaluation of 4 day history of nasal congestion, sore throat, sinus pressure, PND, and nonproductive cough.  Symptoms have been progressively worsening.  She has been taking Mucinex and Zyrtec which do not seem to be helping.  Denies chest pain, SOB, wheezing, hemoptysis, fever, chills, nausea, vomiting, headache, or dizziness. Hx of seasonal allergies.  Patient is otherwise doing well with no other concerns today.     Review of Systems  Constitutional: Negative for fever and chills.  HENT: Positive for congestion, postnasal drip, rhinorrhea, sinus pressure and sore throat.   Respiratory: Positive for cough. Negative for chest tightness, shortness of breath and wheezing.   Gastrointestinal: Negative for nausea and vomiting.  Neurological: Negative for dizziness and headaches.       Objective:   Physical Exam  Constitutional: She is oriented to person, place, and time. She appears well-developed and well-nourished.  HENT:  Head: Normocephalic and atraumatic.  Right Ear: Hearing, tympanic membrane, external ear and ear canal normal.  Left Ear: Hearing, tympanic membrane, external ear and ear canal normal.  Mouth/Throat: Uvula is midline, oropharynx is clear and moist and mucous membranes are normal.  Eyes: Conjunctivae are normal.  Neck: Normal range of motion.  Cardiovascular: Normal rate, regular rhythm and normal heart sounds.   Pulmonary/Chest: Effort normal and breath sounds normal.  Lymphadenopathy:    She has no cervical adenopathy.  Neurological: She is alert and oriented to person, place, and time.  Psychiatric: She has a normal mood and affect. Her behavior is normal. Judgment and thought content normal.          Assessment & Plan:  Sinusitis - Plan: amoxicillin (AMOXIL) 875 MG tablet, DISCONTINUED: amoxicillin (AMOXIL) 875 MG  tablet  Cough - Plan: HYDROcodone-homatropine (HYCODAN) 5-1.5 MG/5ML syrup  Nasal congestion - Plan: ipratropium (ATROVENT) 0.03 % nasal spray, DISCONTINUED: ipratropium (ATROVENT) 0.03 % nasal spray  Will treat sinusitis with amoxicillin 875 mg bid  Atrovent NS twice daily to help with congestion Hycodan qhs prn cough.  Push fluids Continue Mucinex as directed Follow up if symptoms worsen or fail to improve.

## 2014-02-09 ENCOUNTER — Telehealth: Payer: Self-pay | Admitting: Family Medicine

## 2014-02-09 MED ORDER — RANITIDINE HCL 300 MG PO TABS
300.0000 mg | ORAL_TABLET | Freq: Every day | ORAL | Status: DC
Start: 2014-02-09 — End: 2014-05-06

## 2014-02-09 NOTE — Telephone Encounter (Signed)
[id number] [address], [address] Is requesting re-fill on ranitidine (ZANTAC) 300 MG tablet

## 2014-02-09 NOTE — Telephone Encounter (Signed)
Rx sent to pharmacy   

## 2014-03-26 ENCOUNTER — Other Ambulatory Visit: Payer: 59 | Admitting: Family Medicine

## 2014-03-26 ENCOUNTER — Other Ambulatory Visit (INDEPENDENT_AMBULATORY_CARE_PROVIDER_SITE_OTHER): Payer: 59

## 2014-03-26 DIAGNOSIS — Z0000 Encounter for general adult medical examination without abnormal findings: Secondary | ICD-10-CM

## 2014-03-26 LAB — CBC WITH DIFFERENTIAL/PLATELET
BASOS ABS: 0 10*3/uL (ref 0.0–0.1)
Basophils Relative: 0.4 % (ref 0.0–3.0)
Eosinophils Absolute: 0.1 10*3/uL (ref 0.0–0.7)
Eosinophils Relative: 1.5 % (ref 0.0–5.0)
HCT: 39.4 % (ref 36.0–46.0)
Hemoglobin: 13.1 g/dL (ref 12.0–15.0)
Lymphocytes Relative: 32.1 % (ref 12.0–46.0)
Lymphs Abs: 2.4 10*3/uL (ref 0.7–4.0)
MCHC: 33.3 g/dL (ref 30.0–36.0)
MCV: 92.4 fl (ref 78.0–100.0)
MONOS PCT: 6.2 % (ref 3.0–12.0)
Monocytes Absolute: 0.5 10*3/uL (ref 0.1–1.0)
Neutro Abs: 4.4 10*3/uL (ref 1.4–7.7)
Neutrophils Relative %: 59.8 % (ref 43.0–77.0)
PLATELETS: 255 10*3/uL (ref 150.0–400.0)
RBC: 4.27 Mil/uL (ref 3.87–5.11)
RDW: 13.6 % (ref 11.5–15.5)
WBC: 7.4 10*3/uL (ref 4.0–10.5)

## 2014-03-26 LAB — LIPID PANEL
CHOLESTEROL: 195 mg/dL (ref 0–200)
Cholesterol: 203 mg/dL — ABNORMAL HIGH (ref 0–200)
HDL: 42.2 mg/dL (ref >39.00)
HDL: 38.8 mg/dL — AB (ref >39.00)
LDL CALC: 126 mg/dL — AB (ref 0–99)
NONHDL: 164.2
NonHDL: 152.8
TRIGLYCERIDES: 134 mg/dL (ref 0.0–149.0)
Total CHOL/HDL Ratio: 5
Total CHOL/HDL Ratio: 5
Triglycerides: 259 mg/dL — ABNORMAL HIGH (ref 0.0–149.0)
VLDL: 26.8 mg/dL (ref 0.0–40.0)
VLDL: 51.8 mg/dL — AB (ref 0.0–40.0)

## 2014-03-26 LAB — BASIC METABOLIC PANEL
BUN: 12 mg/dL (ref 6–23)
BUN: 12 mg/dL (ref 6–23)
CHLORIDE: 108 meq/L (ref 96–112)
CO2: 27 mEq/L (ref 19–32)
CO2: 25 mEq/L (ref 19–32)
CREATININE: 0.8 mg/dL (ref 0.4–1.2)
CREATININE: 0.7 mg/dL (ref 0.4–1.2)
Calcium: 9.4 mg/dL (ref 8.4–10.5)
Calcium: 9.5 mg/dL (ref 8.4–10.5)
Chloride: 109 mEq/L (ref 96–112)
GFR: 90.31 mL/min (ref >60.00)
GFR: 104.54 mL/min (ref >60.00)
Glucose, Bld: 70 mg/dL (ref 70–99)
Glucose, Bld: 77 mg/dL (ref 70–99)
Potassium: 4.3 mEq/L (ref 3.5–5.1)
Potassium: 3.8 mEq/L (ref 3.5–5.1)
Sodium: 142 mEq/L (ref 135–145)
Sodium: 140 mEq/L (ref 135–145)

## 2014-03-26 LAB — POCT URINALYSIS DIPSTICK
Bilirubin, UA: NEGATIVE
Blood, UA: NEGATIVE
Glucose, UA: NEGATIVE
KETONES UA: NEGATIVE
Leukocytes, UA: NEGATIVE
Nitrite, UA: NEGATIVE
Protein, UA: NEGATIVE
SPEC GRAV UA: 1.015
Urobilinogen, UA: 0.2
pH, UA: 6.5

## 2014-03-26 LAB — HEPATIC FUNCTION PANEL
ALK PHOS: 66 U/L (ref 39–117)
ALK PHOS: 68 U/L (ref 39–117)
ALT: 21 U/L (ref 0–35)
ALT: 23 U/L (ref 0–35)
AST: 18 U/L (ref 0–37)
AST: 18 U/L (ref 0–37)
Albumin: 4 g/dL (ref 3.5–5.2)
Albumin: 4 g/dL (ref 3.5–5.2)
BILIRUBIN DIRECT: 0 mg/dL (ref 0.0–0.3)
BILIRUBIN TOTAL: 0.5 mg/dL (ref 0.2–1.2)
Bilirubin, Direct: 0 mg/dL (ref 0.0–0.3)
Total Bilirubin: 0.5 mg/dL (ref 0.2–1.2)
Total Protein: 7.1 g/dL (ref 6.0–8.3)
Total Protein: 7.1 g/dL (ref 6.0–8.3)

## 2014-03-26 LAB — TSH
TSH: 0.29 u[IU]/mL — ABNORMAL LOW (ref 0.35–4.50)
TSH: 0.41 u[IU]/mL (ref 0.35–4.50)

## 2014-03-26 LAB — LDL CHOLESTEROL, DIRECT: Direct LDL: 130.6 mg/dL

## 2014-03-26 NOTE — Addendum Note (Signed)
Addended by: Elmer Picker on: 03/26/2014 03:19 PM   Modules accepted: Orders

## 2014-04-02 ENCOUNTER — Encounter: Payer: 59 | Admitting: Family Medicine

## 2014-04-20 ENCOUNTER — Encounter: Payer: Self-pay | Admitting: Internal Medicine

## 2014-05-04 ENCOUNTER — Encounter: Payer: Self-pay | Admitting: Internal Medicine

## 2014-05-06 ENCOUNTER — Other Ambulatory Visit (HOSPITAL_COMMUNITY)
Admission: RE | Admit: 2014-05-06 | Discharge: 2014-05-06 | Disposition: A | Discharge status: Home / Self Care | Payer: 59 | Source: Ambulatory Visit | Attending: Family Medicine | Admitting: Family Medicine

## 2014-05-06 ENCOUNTER — Ambulatory Visit (INDEPENDENT_AMBULATORY_CARE_PROVIDER_SITE_OTHER): Payer: 59 | Admitting: Family Medicine

## 2014-05-06 ENCOUNTER — Encounter: Payer: Self-pay | Admitting: Family Medicine

## 2014-05-06 VITALS — BP 140/96 | Temp 98.1°F | Ht 64.0 in | Wt 147.0 lb

## 2014-05-06 DIAGNOSIS — J309 Allergic rhinitis, unspecified: Secondary | ICD-10-CM

## 2014-05-06 DIAGNOSIS — Z86010 Personal history of colonic polyps: Secondary | ICD-10-CM

## 2014-05-06 DIAGNOSIS — Z01419 Encounter for gynecological examination (general) (routine) without abnormal findings: Secondary | ICD-10-CM

## 2014-05-06 DIAGNOSIS — Z0000 Encounter for general adult medical examination without abnormal findings: Secondary | ICD-10-CM

## 2014-05-06 DIAGNOSIS — R87810 Cervical high risk human papillomavirus (HPV) DNA test positive: Secondary | ICD-10-CM | POA: Insufficient documentation

## 2014-05-06 DIAGNOSIS — Z23 Encounter for immunization: Secondary | ICD-10-CM

## 2014-05-06 DIAGNOSIS — I10 Essential (primary) hypertension: Secondary | ICD-10-CM | POA: Insufficient documentation

## 2014-05-06 DIAGNOSIS — Z1151 Encounter for screening for human papillomavirus (HPV): Secondary | ICD-10-CM | POA: Diagnosis present

## 2014-05-06 DIAGNOSIS — R030 Elevated blood-pressure reading, without diagnosis of hypertension: Secondary | ICD-10-CM

## 2014-05-06 DIAGNOSIS — R12 Heartburn: Secondary | ICD-10-CM

## 2014-05-06 MED ORDER — RANITIDINE HCL 300 MG PO TABS: 300.0000 mg | ORAL_TABLET | Freq: Every day | ORAL | Status: DC

## 2014-05-06 NOTE — Progress Notes (Signed)
Pre visit review using our clinic review tool, if applicable. No additional management support is needed unless otherwise documented below in the visit note. 

## 2014-05-06 NOTE — Progress Notes (Signed)
   Subjective:    Patient ID: Anita Whitehead, female    DOB: 09/14/1955, 58 y.o.   MRN: [id number]  HPI Anita Whitehead is a 58 year old female nonsmoker who comes in today for general physical examination  He has a history of allergic rhinitis and takes Zyrtec 10 mg daily. She also takes Zantac 300 mg daily at bedtime because of nighttime heartburn. No difficulty swallowing.  She gets routine eye care, dental care, BSE monthly, and you mammography, colonoscopy at age 13 she is due for followup because of some colon polyps.  She was diagnosed at age 39 of uterine cancer and had her uterus and ovaries removed. Because of her history of a malignancy I would recommend annual pelvic  6 vaccinations updated by Apolonio Schneiders   Review of Systems  Constitutional: Negative.   HENT: Negative.   Eyes: Negative.   Respiratory: Negative.   Cardiovascular: Negative.   Gastrointestinal: Negative.   Genitourinary: Negative.   Musculoskeletal: Negative.   Neurological: Negative.   Psychiatric/Behavioral: Negative.        Objective:   Physical Exam  Nursing note and vitals reviewed. Constitutional: She appears well-developed and well-nourished.  HENT:  Head: Normocephalic and atraumatic.  Right Ear: External ear normal.  Left Ear: External ear normal.  Nose: Nose normal.  Mouth/Throat: Oropharynx is clear and moist.  Eyes: EOM are normal. Pupils are equal, round, and reactive to light.  Neck: Normal range of motion. Neck supple. No thyromegaly present.  Cardiovascular: Normal rate, regular rhythm, normal heart sounds and intact distal pulses.  Exam reveals no gallop and no friction rub.   No murmur heard. No carotid artery bruits peripheral pulses 2+ and symmetrical  Pulmonary/Chest: Effort normal and breath sounds normal.  Abdominal: Soft. Bowel sounds are normal. She exhibits no distension and no mass. There is no tenderness. There is no rebound.  Genitourinary: Vagina normal. No vaginal discharge  found.  Bilateral breast exam normal  Examination of the suture line in her vagina is normal. Scraping was done of the surgical cuff  Musculoskeletal: Normal range of motion.  Lymphadenopathy:    She has no cervical adenopathy.  Neurological: She is alert. She has normal reflexes. No cranial nerve deficit. She exhibits normal muscle tone. Coordination normal.  Skin: Skin is warm and dry.  Psychiatric: She has a normal mood and affect. Her behavior is normal. Judgment and thought content normal.          Assessment & Plan:   healthy female  Allergic rhinitis continue Zyrtec  Nighttime reflux continues Zantac 300 mg daily  Elevated blood pressure................ followup BP........ after physical examination 150/90....... historically she's had hypertension in the past but with diet exercise and weight loss her blood pressure went back to normal and she's been off antihypertensive medications now for 2 years.  Hypertension does run in her family......... BP check daily followup in 4 weeks

## 2014-05-06 NOTE — Patient Instructions (Signed)
BP check every morning,,,,,,, followup in 4 weeks,,,,,,,,, bring the data and the device with you  Walk 30 minutes daily  No salt diet,

## 2014-05-07 ENCOUNTER — Telehealth: Payer: Self-pay | Admitting: Family Medicine

## 2014-05-07 NOTE — Telephone Encounter (Signed)
emmi emailed °

## 2014-05-08 LAB — CYTOLOGY - PAP

## 2014-06-08 ENCOUNTER — Ambulatory Visit: Payer: 59 | Admitting: Family Medicine

## 2014-06-08 ENCOUNTER — Encounter: Payer: Self-pay | Admitting: Family Medicine

## 2014-06-09 ENCOUNTER — Encounter: Payer: Self-pay | Admitting: Family Medicine

## 2014-06-09 ENCOUNTER — Ambulatory Visit (INDEPENDENT_AMBULATORY_CARE_PROVIDER_SITE_OTHER): Payer: 59 | Admitting: Family Medicine

## 2014-06-09 VITALS — BP 130/90 | Temp 98.2°F | Wt 145.0 lb

## 2014-06-09 DIAGNOSIS — R030 Elevated blood-pressure reading, without diagnosis of hypertension: Secondary | ICD-10-CM

## 2014-06-09 NOTE — Progress Notes (Signed)
Pre visit review using our clinic review tool, if applicable. No additional management support is needed unless otherwise documented below in the visit note. 

## 2014-06-09 NOTE — Patient Instructions (Signed)
66mron pump up digital blood pressure cuff.......Marland Kitchen [place] your blood pressure weekly. Blood pressure goal 135/85 or less  Return when necessary

## 2014-06-09 NOTE — Progress Notes (Signed)
   Subjective:    Patient ID: Anita Whitehead, female    DOB: 08-22-1955, 58 y.o.   MRN: [id number]  HPI Anita Whitehead is a 58 year old female who comes in today for reevaluation of high blood pressure question question  We saw her for physical examination her blood pressure was slightly elevated. She's been monitoring her blood pressure at home all her home blood pressure readings are normal.   Review of Systems Review of systems otherwise negative    Objective:   Physical Exam  Well-developed well-nourished female no acute distress vital signs stable she's afebrile BP right arm sitting position 130/90 at 3:30 in the afternoon  Morning blood pressures are all normal      Assessment & Plan:  Normotensive......... Positive family history of high blood pressure.......Marland Kitchen Monitor blood pressure weekly return when necessary if elevated

## 2014-06-24 ENCOUNTER — Other Ambulatory Visit: Payer: Self-pay

## 2014-06-24 DIAGNOSIS — Z1231 Encounter for screening mammogram for malignant neoplasm of breast: Secondary | ICD-10-CM

## 2014-07-13 ENCOUNTER — Ambulatory Visit
Admission: RE | Admit: 2014-07-13 | Discharge: 2014-07-13 | Disposition: A | Discharge status: Home / Self Care | Payer: 59 | Source: Ambulatory Visit

## 2014-07-13 DIAGNOSIS — Z1231 Encounter for screening mammogram for malignant neoplasm of breast: Secondary | ICD-10-CM

## 2015-04-27 ENCOUNTER — Ambulatory Visit (INDEPENDENT_AMBULATORY_CARE_PROVIDER_SITE_OTHER): Payer: 59 | Admitting: Adult Health

## 2015-04-27 ENCOUNTER — Encounter: Payer: Self-pay | Admitting: Adult Health

## 2015-04-27 VITALS — BP 140/100 | HR 94 | Temp 97.8°F | Wt 142.0 lb

## 2015-04-27 DIAGNOSIS — Z7689 Persons encountering health services in other specified circumstances: Secondary | ICD-10-CM

## 2015-04-27 DIAGNOSIS — I10 Essential (primary) hypertension: Secondary | ICD-10-CM | POA: Diagnosis not present

## 2015-04-27 DIAGNOSIS — Z7189 Other specified counseling: Secondary | ICD-10-CM | POA: Diagnosis not present

## 2015-04-27 LAB — BASIC METABOLIC PANEL
BUN: 15 mg/dL (ref 6–23)
CALCIUM: 9.7 mg/dL (ref 8.4–10.5)
CO2: 25 mEq/L (ref 19–32)
Chloride: 106 mEq/L (ref 96–112)
Creatinine, Ser: 0.78 mg/dL (ref 0.40–1.20)
GFR: 97.99 mL/min (ref >60.00)
GLUCOSE: 97 mg/dL (ref 70–99)
POTASSIUM: 3.8 meq/L (ref 3.5–5.1)
SODIUM: 141 meq/L (ref 135–145)

## 2015-04-27 MED ORDER — HYDROCHLOROTHIAZIDE 12.5 MG PO CAPS: 12.5000 mg | ORAL_CAPSULE | Freq: Every day | ORAL | Status: DC

## 2015-04-27 NOTE — Progress Notes (Signed)
Pre visit review using our clinic review tool, if applicable. No additional management support is needed unless otherwise documented below in the visit note. 

## 2015-04-27 NOTE — Progress Notes (Signed)
HPI:  Anita Whitehead is here to establish care. She is a very pleasant AA female who  has a past medical history of PMS (premenstrual syndrome); Allergy; Hyperlipidemia; Hypertension; and Cervical cancer (1989).  Last PCP and physical: 03/2014 with MD Sherren Mocha Immunizations UTD: Diet:Eats little meals throughout the day. Eats a lot of fruits and vegetables. Not a lot of fried foods.  Exercise:She walks every day.  Colonoscopy:2010 Dexa:Never Mammogram:07/2014   Has the following chronic problems that require follow up and concerns today:  Hypertension  - She has been diagnosed with HTN in the past but was able to be taken off medication due to losing weight. She has been off her medication for the last 4 years. Today in the office her blood pressure is 140/100 on two separate occassions. She does endorse elevated blood pressure readings at home, usually in the 140's. She denies any symptoms.    ROS negative for unless reported above: fevers, chills,feeling poorly, unintentional weight loss, hearing or vision loss, chest pain, palpitations, leg claudication, struggling to breath,Not feeling congested in the chest, no orthopenia, no cough,no wheezing, normal appetite, no soft tissue swelling, no hemoptysis, melena, hematochezia, hematuria, falls, loc, si, or thoughts of self harm.    Past Medical History  Diagnosis Date  . PMS (premenstrual syndrome)   . Allergy   . Hyperlipidemia   . Hypertension     Past Surgical History  Procedure Laterality Date  . Abdominal hysterectomy    . Adenomatous colon polyps      Family History  Problem Relation Age of Onset  . Cancer Other     breast,lung,uterine,colon  . Diabetes Other   . Hypertension Other   . Lung disease Other     Social History   Social History  . Marital Status: Married    Spouse Name: N/A  . Number of Children: N/A  . Years of Education: N/A   Social History Main Topics  . Smoking status: Never Smoker   .  Smokeless tobacco: None  . Alcohol Use: None  . Drug Use: None  . Sexual Activity: Not Asked   Other Topics Concern  . None   Social History Narrative     Current outpatient prescriptions:  .  aspirin 81 MG tablet, Take 81 mg by mouth daily.  , Disp: , Rfl:  .  cetirizine (ZYRTEC) 10 MG tablet, Take 10 mg by mouth daily., Disp: , Rfl:  .  ipratropium (ATROVENT) 0.03 % nasal spray, Place 2 sprays into the nose 2 (two) times daily., Disp: 30 mL, Rfl: 5  EXAM:  Filed Vitals:   04/27/15 1403  BP: 140/100  Pulse: 94  Temp: 97.8 F (36.6 C)    Body mass index is 24.36 kg/(m^2).  GENERAL: vitals reviewed and listed above, alert, oriented, appears well hydrated and in no acute distress  HEENT: atraumatic, conjunttiva clear, no obvious abnormalities on inspection of external nose and ears  NECK: Neck is soft and supple without masses, no adenopathy or thyromegaly, trachea midline, no JVD. Normal range of motion.   LUNGS: clear to auscultation bilaterally, no wheezes, rales or rhonchi, good air movement  CV: Regular rate and rhythm, normal S1/S2, no audible murmurs, gallops, or rubs. No carotid bruit and no peripheral edema.   MS: moves all extremities without noticeable abnormality. No edema noted  Abd: soft/nontender/nondistended/normal bowel sounds   Skin: warm and dry, no rash   Extremities: No clubbing, cyanosis, or edema. Capillary refill is WNL. Pulses  intact bilaterally in upper and lower extremities.   Neuro: CN II-XII intact, sensation and reflexes normal throughout, 5/5 muscle strength in bilateral upper and lower extremities. Normal finger to nose. Normal rapid alternating movements. Normal romberg. No pronator drift.   PSYCH: pleasant and cooperative, no obvious depression or anxiety  ASSESSMENT AND PLAN:  1. Essential hypertension - Due to past medical history, strong family history of HTN and her elevated blood pressure readings at home. Will trial her on  a low dose of HCTZ - hydrochlorothiazide (MICROZIDE) 12.5 MG capsule; Take 1 capsule (12.5 mg total) by mouth daily.  Dispense: 30 capsule; Refill: 3 - Basic metabolic panel  2. Encounter to establish care - Follow up with me for CPE - Follow up sooner if needed.    -We reviewed the PMH, PSH, FH, SH, Meds and Allergies. -We provided refills for any medications we will prescribe as needed. -We addressed current concerns per orders and patient instructions. -We have asked for records for pertinent exams, studies, vaccines and notes from previous providers. -We have advised patient to follow up per instructions below.   -Patient advised to return or notify a Delphin Funes immediately if symptoms worsen or persist or new concerns arise.    [name], AGNP

## 2015-04-27 NOTE — Patient Instructions (Signed)
It was great meeting you today!  I have sent in a low dose of HCTZ, this is a diuretic that should bring your blood pressure down.   Send me a note in a week with your blood pressure readings.   Follow up with me for a complete physical and let me know if you need anything.

## 2015-06-09 ENCOUNTER — Other Ambulatory Visit (INDEPENDENT_AMBULATORY_CARE_PROVIDER_SITE_OTHER): Payer: 59

## 2015-06-09 DIAGNOSIS — Z0000 Encounter for general adult medical examination without abnormal findings: Secondary | ICD-10-CM

## 2015-06-09 LAB — POCT URINALYSIS DIPSTICK
BILIRUBIN UA: NEGATIVE
GLUCOSE UA: NEGATIVE
Ketones, UA: NEGATIVE
LEUKOCYTES UA: NEGATIVE
NITRITE UA: NEGATIVE
RBC UA: NEGATIVE
Spec Grav, UA: 1.025
Urobilinogen, UA: 0.2
pH, UA: 6

## 2015-06-09 LAB — HEPATIC FUNCTION PANEL
ALT: 22 U/L (ref 0–35)
AST: 16 U/L (ref 0–37)
Albumin: 4.1 g/dL (ref 3.5–5.2)
Alkaline Phosphatase: 70 U/L (ref 39–117)
BILIRUBIN DIRECT: 0.1 mg/dL (ref 0.0–0.3)
TOTAL PROTEIN: 7.2 g/dL (ref 6.0–8.3)
Total Bilirubin: 0.5 mg/dL (ref 0.2–1.2)

## 2015-06-09 LAB — CBC WITH DIFFERENTIAL/PLATELET
BASOS PCT: 0.3 % (ref 0.0–3.0)
Basophils Absolute: 0 10*3/uL (ref 0.0–0.1)
EOS ABS: 0.1 10*3/uL (ref 0.0–0.7)
EOS PCT: 1.7 % (ref 0.0–5.0)
HEMATOCRIT: 43.4 % (ref 36.0–46.0)
HEMOGLOBIN: 14.5 g/dL (ref 12.0–15.0)
LYMPHS PCT: 33 % (ref 12.0–46.0)
Lymphs Abs: 2.8 10*3/uL (ref 0.7–4.0)
MCHC: 33.5 g/dL (ref 30.0–36.0)
MCV: 92.3 fl (ref 78.0–100.0)
MONO ABS: 0.5 10*3/uL (ref 0.1–1.0)
Monocytes Relative: 6.5 % (ref 3.0–12.0)
NEUTROS ABS: 4.9 10*3/uL (ref 1.4–7.7)
Neutrophils Relative %: 58.5 % (ref 43.0–77.0)
PLATELETS: 283 10*3/uL (ref 150.0–400.0)
RBC: 4.7 Mil/uL (ref 3.87–5.11)
RDW: 13.8 % (ref 11.5–15.5)
WBC: 8.4 10*3/uL (ref 4.0–10.5)

## 2015-06-09 LAB — LIPID PANEL
CHOL/HDL RATIO: 5
Cholesterol: 223 mg/dL — ABNORMAL HIGH (ref 0–200)
HDL: 44.9 mg/dL (ref >39.00)
LDL Cholesterol: 153 mg/dL — ABNORMAL HIGH (ref 0–99)
NonHDL: 177.75
TRIGLYCERIDES: 124 mg/dL (ref 0.0–149.0)
VLDL: 24.8 mg/dL (ref 0.0–40.0)

## 2015-06-09 LAB — BASIC METABOLIC PANEL
BUN: 11 mg/dL (ref 6–23)
CHLORIDE: 104 meq/L (ref 96–112)
CO2: 32 meq/L (ref 19–32)
CREATININE: 0.68 mg/dL (ref 0.40–1.20)
Calcium: 9.7 mg/dL (ref 8.4–10.5)
GFR: 114.75 mL/min (ref >60.00)
Glucose, Bld: 86 mg/dL (ref 70–99)
POTASSIUM: 3.7 meq/L (ref 3.5–5.1)
Sodium: 141 mEq/L (ref 135–145)

## 2015-06-09 LAB — TSH: TSH: 1.49 u[IU]/mL (ref 0.35–4.50)

## 2015-06-15 ENCOUNTER — Encounter: Payer: Self-pay | Admitting: Adult Health

## 2015-06-15 ENCOUNTER — Ambulatory Visit (INDEPENDENT_AMBULATORY_CARE_PROVIDER_SITE_OTHER): Payer: 59 | Admitting: Adult Health

## 2015-06-15 ENCOUNTER — Other Ambulatory Visit: Payer: Self-pay

## 2015-06-15 ENCOUNTER — Encounter: Payer: Self-pay | Admitting: Internal Medicine

## 2015-06-15 VITALS — BP 120/90 | HR 98 | Temp 97.8°F | Ht 63.39 in | Wt 140.8 lb

## 2015-06-15 DIAGNOSIS — Z1231 Encounter for screening mammogram for malignant neoplasm of breast: Secondary | ICD-10-CM

## 2015-06-15 DIAGNOSIS — I10 Essential (primary) hypertension: Secondary | ICD-10-CM | POA: Diagnosis not present

## 2015-06-15 DIAGNOSIS — Z0000 Encounter for general adult medical examination without abnormal findings: Secondary | ICD-10-CM | POA: Diagnosis not present

## 2015-06-15 DIAGNOSIS — Z23 Encounter for immunization: Secondary | ICD-10-CM | POA: Diagnosis not present

## 2015-06-15 MED ORDER — HYDROCHLOROTHIAZIDE 12.5 MG PO CAPS: 12.5000 mg | ORAL_CAPSULE | Freq: Every day | ORAL | Status: DC

## 2015-06-15 NOTE — Progress Notes (Signed)
Subjective:    Patient ID: Anita Whitehead, female    DOB: October 07, 1955, 59 y.o.   MRN: [id number]  HPI Patient presents for yearly preventative medicine examination.  He has a history of allergic rhinitis and takes Zyrtec 10 mg daily. She also takes Zantac 300 mg daily at bedtime because of nighttime heartburn. No difficulty swallowing.  She gets routine eye care, dental care, BSE monthly, and you mammography, colonoscopy at age 35 she is due for followup because of some colon polyps. She will schedule this.   She was diagnosed at age 40 of uterine cancer and had her uterus and ovaries removed.   All immunizations and health maintenance protocols were reviewed with the patient and needed orders were placed.  Appropriate screening laboratory values were reviewed with the patient including screening of hyperlipidemia, renal function and hepatic function.  Medication reconciliation,  past medical history, social history, problem list and allergies were reviewed in detail with the patient  Goals were established with regard to weight loss, exercise, and  diet in compliance with medications  End of life planning was discussed- she does not have a living will or advanced directive.     Review of Systems  Constitutional: Negative.   HENT: Negative.   Eyes: Negative.   Respiratory: Negative.   Cardiovascular: Negative.   Endocrine: Negative.   Genitourinary: Negative.   Musculoskeletal: Negative.   Skin: Negative.   Allergic/Immunologic: Positive for environmental allergies.  Neurological: Negative.   Hematological: Negative.   Psychiatric/Behavioral: Negative.   All other systems reviewed and are negative.  Past Medical History  Diagnosis Date  . PMS (premenstrual syndrome)   . Allergy   . Hyperlipidemia   . Hypertension   . Cervical cancer ([place]) 1989    Social History   Social History  . Marital Status: Married    Spouse Name: N/A  . Number of Children: N/A  .  Years of Education: N/A   Occupational History  . Not on file.   Social History Main Topics  . Smoking status: Never Smoker   . Smokeless tobacco: Not on file  . Alcohol Use: 0.0 oz/week    0 Standard drinks or equivalent per week     Comment: 3 beers a week and 2 cocktails during the week  . Drug Use: No  . Sexual Activity: Not on file   Other Topics Concern  . Not on file   Social History Narrative   Civil engineer, contracting for [employer]   Married for 16 years   2 boys, one in [place] and one in [place]    Past Surgical History  Procedure Laterality Date  . Abdominal hysterectomy    . Adenomatous colon polyps      Family History  Problem Relation Age of Onset  . Diabetes Brother   . Hypertension Mother   . Hypertension Sister   . Cancer Sister     Breast, uterine  . Cancer Mother     Unknown    No Known Allergies  Current Outpatient Prescriptions on File Prior to Visit  Medication Sig Dispense Refill  . aspirin 81 MG tablet Take 81 mg by mouth daily.      . cetirizine (ZYRTEC) 10 MG tablet Take 10 mg by mouth daily.    . hydrochlorothiazide (MICROZIDE) 12.5 MG capsule Take 1 capsule (12.5 mg total) by mouth daily. 30 capsule 3  . ipratropium (ATROVENT) 0.03 % nasal spray Place 2 sprays into the nose 2 (  two) times daily. 30 mL 5   No current facility-administered medications on file prior to visit.    BP 120/90 mmHg  Pulse 116  Temp(Src) 97.8 F (36.6 C) (Oral)  Ht 5' 3.39" (1.61 m)  Wt 140 lb 12.8 oz (63.866 kg)  BMI 24.64 kg/m2       Objective:   Physical Exam  Constitutional: She is oriented to person, place, and time. She appears well-developed and well-nourished. No distress.  HENT:  Head: Normocephalic and atraumatic.  Right Ear: External ear normal.  Left Ear: External ear normal.  Nose: Nose normal.  Mouth/Throat: Oropharynx is clear and moist.  Eyes: Conjunctivae and EOM are normal. Pupils are equal, round, and reactive  to light. Right eye exhibits no discharge. Left eye exhibits no discharge. No scleral icterus.  Neck: Normal range of motion. Neck supple. No JVD present. No tracheal deviation present. No thyromegaly present.  Cardiovascular: Normal rate, regular rhythm, normal heart sounds and intact distal pulses.  Exam reveals no friction rub.   No murmur heard. Pulmonary/Chest: Effort normal and breath sounds normal. No respiratory distress. She has no wheezes. She has no rales. She exhibits no tenderness.  Abdominal: Soft. Bowel sounds are normal. She exhibits no distension and no mass. There is no tenderness. There is no rebound and no guarding.  Genitourinary: Vagina normal. No vaginal discharge found.  Breast Exam: No masses, lumps, dimpling or discharge.   Musculoskeletal: Normal range of motion. She exhibits no edema or tenderness.  Lymphadenopathy:    She has no cervical adenopathy.  Neurological: She is alert and oriented to person, place, and time. She has normal reflexes. She displays normal reflexes. No cranial nerve deficit. She exhibits normal muscle tone. Coordination normal.  Skin: Skin is warm and dry. No rash noted. She is not diaphoretic. No erythema. No pallor.  Psychiatric: She has a normal mood and affect. Her behavior is normal. Judgment and thought content normal.  Nursing note and vitals reviewed.     Assessment & Plan:  1. Routine general medical examination at a health care facility - Follow up in one year for CPE - Follow up sooner if needed - Reviewed labs   2. Essential hypertension - Normotensive - hydrochlorothiazide (MICROZIDE) 12.5 MG capsule; Take 1 capsule (12.5 mg total) by mouth daily.  Dispense: 90 capsule; Refill: 3

## 2015-06-15 NOTE — Patient Instructions (Addendum)
It was great seeing you again!  Continue to do whatever you are doing!  Eat healthy and exercise.   Follow up with me in one year or sooner if needed  Health Maintenance, Female Adopting a healthy lifestyle and getting preventive care can go a long way to promote health and wellness. Talk with your health care provider about what schedule of regular examinations is right for you. This is a good chance for you to check in with your provider about disease prevention and staying healthy. In between checkups, there are plenty of things you can do on your own. Experts have done a lot of research about which lifestyle changes and preventive measures are most likely to keep you healthy. Ask your health care provider for more information. WEIGHT AND DIET  Eat a healthy diet  Be sure to include plenty of vegetables, fruits, low-fat dairy products, and lean protein.  Do not eat a lot of foods high in solid fats, added sugars, or salt.  Get regular exercise. This is one of the most important things you can do for your health.  Most adults should exercise for at least 150 minutes each week. The exercise should increase your heart rate and make you sweat (moderate-intensity exercise).  Most adults should also do strengthening exercises at least twice a week. This is in addition to the moderate-intensity exercise.  Maintain a healthy weight  Body mass index (BMI) is a measurement that can be used to identify possible weight problems. It estimates body fat based on height and weight. Your health care provider can help determine your BMI and help you achieve or maintain a healthy weight.  For females 61 years of age and older:   A BMI below 18.5 is considered underweight.  A BMI of 18.5 to 24.9 is normal.  A BMI of 25 to 29.9 is considered overweight.  A BMI of 30 and above is considered obese.  Watch levels of cholesterol and blood lipids  You should start having your blood tested for  lipids and cholesterol at 57 years of age, then have this test every 5 years.  You may need to have your cholesterol levels checked more often if:  Your lipid or cholesterol levels are high.  You are older than 57 years of age.  You are at high risk for heart disease.  CANCER SCREENING   Lung Cancer  Lung cancer screening is recommended for adults 66-56 years old who are at high risk for lung cancer because of a history of smoking.  A yearly low-dose CT scan of the lungs is recommended for people who:  Currently smoke.  Have quit within the past 15 years.  Have at least a 30-pack-year history of smoking. A pack year is smoking an average of one pack of cigarettes a day for 1 year.  Yearly screening should continue until it has been 15 years since you quit.  Yearly screening should stop if you develop a health problem that would prevent you from having lung cancer treatment.  Breast Cancer  Practice breast self-awareness. This means understanding how your breasts normally appear and feel.  It also means doing regular breast self-exams. Let your health care provider know about any changes, no matter how small.  If you are in your 20s or 30s, you should have a clinical breast exam (CBE) by a health care provider every 1-3 years as part of a regular health exam.  If you are 1 or older, have a CBE  every year. Also consider having a breast X-ray (mammogram) every year.  If you have a family history of breast cancer, talk to your health care provider about genetic screening.  If you are at high risk for breast cancer, talk to your health care provider about having an MRI and a mammogram every year.  Breast cancer gene (BRCA) assessment is recommended for women who have family members with BRCA-related cancers. BRCA-related cancers include:  Breast.  Ovarian.  Tubal.  Peritoneal cancers.  Results of the assessment will determine the need for genetic counseling and BRCA1  and BRCA2 testing. Cervical Cancer Your health care provider may recommend that you be screened regularly for cancer of the pelvic organs (ovaries, uterus, and vagina). This screening involves a pelvic examination, including checking for microscopic changes to the surface of your cervix (Pap test). You may be encouraged to have this screening done every 3 years, beginning at age 68.  For women ages 70-65, health care providers may recommend pelvic exams and Pap testing every 3 years, or they may recommend the Pap and pelvic exam, combined with testing for human papilloma virus (HPV), every 5 years. Some types of HPV increase your risk of cervical cancer. Testing for HPV may also be done on women of any age with unclear Pap test results.  Other health care providers may not recommend any screening for nonpregnant women who are considered low risk for pelvic cancer and who do not have symptoms. Ask your health care provider if a screening pelvic exam is right for you.  If you have had past treatment for cervical cancer or a condition that could lead to cancer, you need Pap tests and screening for cancer for at least 20 years after your treatment. If Pap tests have been discontinued, your risk factors (such as having a new sexual partner) need to be reassessed to determine if screening should resume. Some women have medical problems that increase the chance of getting cervical cancer. In these cases, your health care provider may recommend more frequent screening and Pap tests. Colorectal Cancer  This type of cancer can be detected and often prevented.  Routine colorectal cancer screening usually begins at 57 years of age and continues through 57 years of age.  Your health care provider may recommend screening at an earlier age if you have risk factors for colon cancer.  Your health care provider may also recommend using home test kits to check for hidden blood in the stool.  A small camera at the  end of a tube can be used to examine your colon directly (sigmoidoscopy or colonoscopy). This is done to check for the earliest forms of colorectal cancer.  Routine screening usually begins at age 73.  Direct examination of the colon should be repeated every 5-10 years through 57 years of age. However, you may need to be screened more often if early forms of precancerous polyps or small growths are found. Skin Cancer  Check your skin from head to toe regularly.  Tell your health care provider about any new moles or changes in moles, especially if there is a change in a mole's shape or color.  Also tell your health care provider if you have a mole that is larger than the size of a pencil eraser.  Always use sunscreen. Apply sunscreen liberally and repeatedly throughout the day.  Protect yourself by wearing long sleeves, pants, a wide-brimmed hat, and sunglasses whenever you are outside. HEART DISEASE, DIABETES, AND HIGH BLOOD PRESSURE  High blood pressure causes heart disease and increases the risk of stroke. High blood pressure is more likely to develop in:  People who have blood pressure in the high end of the normal range (130-139/85-89 mm Hg).  People who are overweight or obese.  People who are African American.  If you are 18-39 years of age, have your blood pressure checked every 3-5 years. If you are 40 years of age or older, have your blood pressure checked every year. You should have your blood pressure measured twice--once when you are at a hospital or clinic, and once when you are not at a hospital or clinic. Record the average of the two measurements. To check your blood pressure when you are not at a hospital or clinic, you can use:  An automated blood pressure machine at a pharmacy.  A home blood pressure monitor.  If you are between 55 years and 79 years old, ask your health care provider if you should take aspirin to prevent strokes.  Have regular diabetes  screenings. This involves taking a blood sample to check your fasting blood sugar level.  If you are at a normal weight and have a low risk for diabetes, have this test once every three years after 57 years of age.  If you are overweight and have a high risk for diabetes, consider being tested at a younger age or more often. PREVENTING INFECTION  Hepatitis B  If you have a higher risk for hepatitis B, you should be screened for this virus. You are considered at high risk for hepatitis B if:  You were born in a country where hepatitis B is common. Ask your health care provider which countries are considered high risk.  Your parents were born in a high-risk country, and you have not been immunized against hepatitis B (hepatitis B vaccine).  You have HIV or AIDS.  You use needles to inject street drugs.  You live with someone who has hepatitis B.  You have had sex with someone who has hepatitis B.  You get hemodialysis treatment.  You take certain medicines for conditions, including cancer, organ transplantation, and autoimmune conditions. Hepatitis C  Blood testing is recommended for:  Everyone born from 1945 through 1965.  Anyone with known risk factors for hepatitis C. Sexually transmitted infections (STIs)  You should be screened for sexually transmitted infections (STIs) including gonorrhea and chlamydia if:  You are sexually active and are younger than 57 years of age.  You are older than 57 years of age and your health care provider tells you that you are at risk for this type of infection.  Your sexual activity has changed since you were last screened and you are at an increased risk for chlamydia or gonorrhea. Ask your health care provider if you are at risk.  If you do not have HIV, but are at risk, it may be recommended that you take a prescription medicine daily to prevent HIV infection. This is called pre-exposure prophylaxis (PrEP). You are considered at risk  if:  You are sexually active and do not regularly use condoms or know the HIV status of your partner(s).  You take drugs by injection.  You are sexually active with a partner who has HIV. Talk with your health care provider about whether you are at high risk of being infected with HIV. If you choose to begin PrEP, you should first be tested for HIV. You should then be tested every 3 months for as   long as you are taking PrEP.  PREGNANCY   If you are premenopausal and you may become pregnant, ask your health care provider about preconception counseling.  If you may become pregnant, take 400 to 800 micrograms (mcg) of folic acid every day.  If you want to prevent pregnancy, talk to your health care provider about birth control (contraception). OSTEOPOROSIS AND MENOPAUSE   Osteoporosis is a disease in which the bones lose minerals and strength with aging. This can result in serious bone fractures. Your risk for osteoporosis can be identified using a bone density scan.  If you are 30 years of age or older, or if you are at risk for osteoporosis and fractures, ask your health care provider if you should be screened.  Ask your health care provider whether you should take a calcium or vitamin D supplement to lower your risk for osteoporosis.  Menopause may have certain physical symptoms and risks.  Hormone replacement therapy may reduce some of these symptoms and risks. Talk to your health care provider about whether hormone replacement therapy is right for you.  HOME CARE INSTRUCTIONS   Schedule regular health, dental, and eye exams.  Stay current with your immunizations.   Do not use any tobacco products including cigarettes, chewing tobacco, or electronic cigarettes.  If you are pregnant, do not drink alcohol.  If you are breastfeeding, limit how much and how often you drink alcohol.  Limit alcohol intake to no more than 1 drink per day for nonpregnant women. One drink equals 12  ounces of beer, 5 ounces of wine, or 1 ounces of hard liquor.  Do not use street drugs.  Do not share needles.  Ask your health care provider for help if you need support or information about quitting drugs.  Tell your health care provider if you often feel depressed.  Tell your health care provider if you have ever been abused or do not feel safe at home.   This information is not intended to replace advice given to you by your health care provider. Make sure you discuss any questions you have with your health care provider.   Document Released: 02/06/2011 Document Revised: 08/14/2014 Document Reviewed: 06/25/2013 Elsevier Interactive Patient Education [employer].

## 2015-06-15 NOTE — Progress Notes (Signed)
Pre visit review using our clinic review tool, if applicable. No additional management support is needed unless otherwise documented below in the visit note. 

## 2015-06-17 ENCOUNTER — Encounter: Payer: Self-pay | Admitting: Internal Medicine

## 2015-07-20 ENCOUNTER — Ambulatory Visit
Admission: RE | Admit: 2015-07-20 | Discharge: 2015-07-20 | Disposition: A | Discharge status: Home / Self Care | Payer: 59 | Source: Ambulatory Visit

## 2015-07-20 DIAGNOSIS — Z1231 Encounter for screening mammogram for malignant neoplasm of breast: Secondary | ICD-10-CM

## 2015-08-19 ENCOUNTER — Ambulatory Visit (AMBULATORY_SURGERY_CENTER): Payer: Self-pay | Admitting: *Deleted

## 2015-08-19 VITALS — Ht 62.5 in | Wt 141.0 lb

## 2015-08-19 DIAGNOSIS — Z86010 Personal history of colonic polyps: Secondary | ICD-10-CM

## 2015-08-19 NOTE — Progress Notes (Signed)
No egg or soy allergy known to patient  No issues with past sedation with any surgeries  or procedures, no intubation problems  No diet pills-per patient  No home 02 use per patient  No blood thinners but baby asa  Pt states for her IV we need to use her hand for a good vein   emmi video declined

## 2015-08-25 ENCOUNTER — Other Ambulatory Visit: Payer: Self-pay | Admitting: Adult Health

## 2015-08-25 DIAGNOSIS — I10 Essential (primary) hypertension: Secondary | ICD-10-CM

## 2015-08-25 MED ORDER — HYDROCHLOROTHIAZIDE 12.5 MG PO CAPS: 12.5000 mg | ORAL_CAPSULE | Freq: Every day | ORAL | Status: DC

## 2015-08-27 ENCOUNTER — Encounter: Payer: Self-pay | Admitting: Adult Health

## 2015-08-30 ENCOUNTER — Other Ambulatory Visit: Payer: Self-pay | Admitting: Adult Health

## 2015-08-30 DIAGNOSIS — I10 Essential (primary) hypertension: Secondary | ICD-10-CM

## 2015-08-30 MED ORDER — HYDROCHLOROTHIAZIDE 12.5 MG PO CAPS: 12.5000 mg | ORAL_CAPSULE | Freq: Every day | ORAL | Status: DC

## 2015-09-02 ENCOUNTER — Ambulatory Visit (AMBULATORY_SURGERY_CENTER): Payer: 59 | Admitting: Internal Medicine

## 2015-09-02 ENCOUNTER — Encounter: Payer: Self-pay | Admitting: Internal Medicine

## 2015-09-02 VITALS — BP 127/89 | HR 86 | Temp 98.9°F | Resp 24 | Ht 62.5 in | Wt 141.0 lb

## 2015-09-02 DIAGNOSIS — D122 Benign neoplasm of ascending colon: Secondary | ICD-10-CM

## 2015-09-02 DIAGNOSIS — Z86010 Personal history of colonic polyps: Secondary | ICD-10-CM

## 2015-09-02 DIAGNOSIS — D124 Benign neoplasm of descending colon: Secondary | ICD-10-CM

## 2015-09-02 MED ORDER — SODIUM CHLORIDE 0.9 % IV SOLN: 500.0000 mL | INTRAVENOUS | Status: DC

## 2015-09-02 NOTE — Progress Notes (Signed)
Called to room to assist during endoscopic procedure.  Patient ID and intended procedure confirmed with present staff. Received instructions for my participation in the procedure from the performing physician.  

## 2015-09-02 NOTE — Progress Notes (Signed)
No problems noted in the recovery room. maw 

## 2015-09-02 NOTE — Op Note (Signed)
[place]  [address] [place], [address]   COLONOSCOPY PROCEDURE REPORT  PATIENT: Anita Whitehead, Anita Whitehead  MR#: [id number] BIRTHDATE: 1956-04-13 , 42  yrs. old GENDER: female ENDOSCOPIST: Gatha Mayer, MD, [facility] PROCEDURE DATE:  09/02/2015 PROCEDURE:   Colonoscopy, surveillance and Colonoscopy with snare polypectomy First Screening Colonoscopy - Avg.  risk and is 50 yrs.  old or older - No.  Prior Negative Screening - Now for repeat screening. N/A  History of Adenoma - Now for follow-up colonoscopy & has been > or = to 3 yrs.  Yes hx of adenoma.  Has been 3 or more years since last colonoscopy.  Polyps removed today? Yes ASA CLASS:   Class II INDICATIONS:Surveillance due to prior colonic neoplasia and PH Colon Adenoma. MEDICATIONS: Propofol 350 mg IV and Monitored anesthesia care  DESCRIPTION OF PROCEDURE:   After the risks benefits and alternatives of the procedure were thoroughly explained, informed consent was obtained.  The digital rectal exam revealed no abnormalities of the rectum.   The LB [id number]-[id number]  endoscope was introduced through the anus and advanced to the cecum, which was identified by both the appendix and ileocecal valve. No adverse events experienced.   The quality of the prep was good.  (MiraLax was used)  The instrument was then slowly withdrawn as the colon was fully examined. Estimated blood loss is zero unless otherwise noted in this procedure report.      COLON FINDINGS: Five polypoid shaped sessile polyps ranging from 3 to 44mm in size were found in the ascending colon and descending colon.  Polypectomies were performed with a cold snare.  The resection was complete, the polyp tissue was completely retrieved and sent to histology.   The examination was otherwise normal. Retroflexed views revealed no abnormalities. The time to cecum = 2.1 Withdrawal time = 11.5   The scope was withdrawn and the procedure  completed. COMPLICATIONS: There were no immediate complications.  ENDOSCOPIC IMPRESSION: 1.   Five sessile polyps ranging from 3 to 17mm in size were found in the ascending colon and descending colon; polypectomies were performed with a cold snare 2.   The examination was otherwise normal - good prep  RECOMMENDATIONS: Timing of repeat colonoscopy will be determined by pathology findings.  Hx 3 small adenomas 2006, none 2010  eSigned:  Gatha Mayer, MD, [facility] 09/02/2015 10:18 AM   cc: The Patient

## 2015-09-02 NOTE — Progress Notes (Signed)
Patient awakening,vss,report to rn 

## 2015-09-02 NOTE — Patient Instructions (Addendum)
I found and removed 5 small polyps. I anticipate that you will need a repeat exam in 3 years.  I will let you know pathology results and when to have another routine colonoscopy by mail.  I appreciate the opportunity to care for you. Gatha Mayer, MD, FACG     YOU HAD AN ENDOSCOPIC PROCEDURE TODAY AT [facility]:   Refer to the procedure report that was given to you for any specific questions about what was found during the examination.  If the procedure report does not answer your questions, please call your gastroenterologist to clarify.  If you requested that your care partner not be given the details of your procedure findings, then the procedure report has been included in a sealed envelope for you to review at your convenience later.  YOU SHOULD EXPECT: Some feelings of bloating in the abdomen. Passage of more gas than usual.  Walking can help get rid of the air that was put into your GI tract during the procedure and reduce the bloating. If you had a lower endoscopy (such as a colonoscopy or flexible sigmoidoscopy) you may notice spotting of blood in your stool or on the toilet paper. If you underwent a bowel prep for your procedure, you may not have a normal bowel movement for a few days.  Please Note:  You might notice some irritation and congestion in your nose or some drainage.  This is from the oxygen used during your procedure.  There is no need for concern and it should clear up in a day or so.  SYMPTOMS TO REPORT IMMEDIATELY:   Following lower endoscopy (colonoscopy or flexible sigmoidoscopy):  Excessive amounts of blood in the stool  Significant tenderness or worsening of abdominal pains  Swelling of the abdomen that is new, acute  Fever of 100F or higher   For urgent or emergent issues, a gastroenterologist can be reached at any hour by calling [phone].   DIET: Your first meal following the procedure should be a small meal and then it  is ok to progress to your normal diet. Heavy or fried foods are harder to digest and may make you feel nauseous or bloated.  Likewise, meals heavy in dairy and vegetables can increase bloating.  Drink plenty of fluids but you should avoid alcoholic beverages for 24 hours.  ACTIVITY:  You should plan to take it easy for the rest of today and you should NOT DRIVE or use heavy machinery until tomorrow (because of the sedation medicines used during the test).    FOLLOW UP: Our staff will call the number listed on your records the next business day following your procedure to check on you and address any questions or concerns that you may have regarding the information given to you following your procedure. If we do not reach you, we will leave a message.  However, if you are feeling well and you are not experiencing any problems, there is no need to return our call.  We will assume that you have returned to your regular daily activities without incident.  If any biopsies were taken you will be contacted by phone or by letter within the next 1-3 weeks.  Please call us at [phone] if you have not heard about the biopsies in 3 weeks.    SIGNATURES/CONFIDENTIALITY: You and/or your care partner have signed paperwork which will be entered into your electronic medical record.  These signatures attest to the fact that that  the information above on your After Visit Summary has been reviewed and is understood.  Full responsibility of the confidentiality of this discharge information lies with you and/or your care-partner.     Handout was given to your care partner on polyps. You may resume your current medications today. Await biopsy results. Please call if any questions or concerns.

## 2015-09-03 ENCOUNTER — Telehealth: Payer: Self-pay | Admitting: *Deleted

## 2015-09-03 NOTE — Telephone Encounter (Signed)
  Follow up Call-  Call back number 09/02/2015  Post procedure Call Back phone  # [phone]  Permission to leave phone message Yes     Patient questions:  Do you have a fever, pain , or abdominal swelling? No. Pain Score  0 *  Have you tolerated food without any problems? Yes.    Have you been able to return to your normal activities? Yes.    Do you have any questions about your discharge instructions: Diet   No. Medications  No. Follow up visit  No.  Do you have questions or concerns about your Care? No.  Actions: * If pain score is 4 or above: No action needed, pain <4.

## 2015-09-07 ENCOUNTER — Encounter: Payer: Self-pay | Admitting: Internal Medicine

## 2015-09-07 DIAGNOSIS — Z86010 Personal history of colonic polyps: Secondary | ICD-10-CM

## 2015-09-07 NOTE — Progress Notes (Signed)
Quick Note:  5 tubular adenomas - repeat colonoscopy 2020 ______

## 2016-06-09 ENCOUNTER — Other Ambulatory Visit (INDEPENDENT_AMBULATORY_CARE_PROVIDER_SITE_OTHER): Payer: 59

## 2016-06-09 DIAGNOSIS — Z0000 Encounter for general adult medical examination without abnormal findings: Secondary | ICD-10-CM

## 2016-06-09 LAB — CBC WITH DIFFERENTIAL/PLATELET
BASOS PCT: 0.3 % (ref 0.0–3.0)
Basophils Absolute: 0 10*3/uL (ref 0.0–0.1)
EOS ABS: 0.1 10*3/uL (ref 0.0–0.7)
EOS PCT: 1.8 % (ref 0.0–5.0)
HEMATOCRIT: 44.1 % (ref 36.0–46.0)
HEMOGLOBIN: 14.8 g/dL (ref 12.0–15.0)
LYMPHS PCT: 35 % (ref 12.0–46.0)
Lymphs Abs: 2.6 10*3/uL (ref 0.7–4.0)
MCHC: 33.5 g/dL (ref 30.0–36.0)
MCV: 92.8 fl (ref 78.0–100.0)
MONOS PCT: 6.6 % (ref 3.0–12.0)
Monocytes Absolute: 0.5 10*3/uL (ref 0.1–1.0)
NEUTROS ABS: 4.2 10*3/uL (ref 1.4–7.7)
Neutrophils Relative %: 56.3 % (ref 43.0–77.0)
PLATELETS: 254 10*3/uL (ref 150.0–400.0)
RBC: 4.75 Mil/uL (ref 3.87–5.11)
RDW: 13.6 % (ref 11.5–15.5)
WBC: 7.5 10*3/uL (ref 4.0–10.5)

## 2016-06-09 LAB — BASIC METABOLIC PANEL
BUN: 14 mg/dL (ref 6–23)
CHLORIDE: 107 meq/L (ref 96–112)
CO2: 27 meq/L (ref 19–32)
CREATININE: 0.77 mg/dL (ref 0.40–1.20)
Calcium: 10 mg/dL (ref 8.4–10.5)
GFR: 99.07 mL/min (ref >60.00)
Glucose, Bld: 89 mg/dL (ref 70–99)
POTASSIUM: 4.2 meq/L (ref 3.5–5.1)
Sodium: 142 mEq/L (ref 135–145)

## 2016-06-09 LAB — POC URINALSYSI DIPSTICK (AUTOMATED)
GLUCOSE UA: NEGATIVE
Ketones, UA: NEGATIVE
Leukocytes, UA: NEGATIVE
NITRITE UA: NEGATIVE
RBC UA: NEGATIVE
SPEC GRAV UA: 1.025
UROBILINOGEN UA: 1
pH, UA: 6

## 2016-06-09 LAB — HEPATIC FUNCTION PANEL
ALBUMIN: 4.6 g/dL (ref 3.5–5.2)
ALT: 19 U/L (ref 0–35)
AST: 20 U/L (ref 0–37)
Alkaline Phosphatase: 68 U/L (ref 39–117)
BILIRUBIN DIRECT: 0.2 mg/dL (ref 0.0–0.3)
TOTAL PROTEIN: 7.5 g/dL (ref 6.0–8.3)
Total Bilirubin: 0.9 mg/dL (ref 0.2–1.2)

## 2016-06-09 LAB — LIPID PANEL
CHOL/HDL RATIO: 5
Cholesterol: 234 mg/dL — ABNORMAL HIGH (ref 0–200)
HDL: 45.8 mg/dL (ref >39.00)
LDL Cholesterol: 152 mg/dL — ABNORMAL HIGH (ref 0–99)
NonHDL: 187.99
TRIGLYCERIDES: 180 mg/dL — AB (ref 0.0–149.0)
VLDL: 36 mg/dL (ref 0.0–40.0)

## 2016-06-09 LAB — TSH: TSH: 1.41 u[IU]/mL (ref 0.35–4.50)

## 2016-06-16 ENCOUNTER — Encounter: Payer: 59 | Admitting: Adult Health

## 2016-06-21 ENCOUNTER — Ambulatory Visit (INDEPENDENT_AMBULATORY_CARE_PROVIDER_SITE_OTHER): Payer: 59 | Admitting: Adult Health

## 2016-06-21 ENCOUNTER — Encounter: Payer: Self-pay | Admitting: Adult Health

## 2016-06-21 VITALS — BP 162/90 | Temp 98.3°F | Ht 62.5 in | Wt 134.7 lb

## 2016-06-21 DIAGNOSIS — E782 Mixed hyperlipidemia: Secondary | ICD-10-CM

## 2016-06-21 DIAGNOSIS — Z23 Encounter for immunization: Secondary | ICD-10-CM | POA: Diagnosis not present

## 2016-06-21 DIAGNOSIS — Z0000 Encounter for general adult medical examination without abnormal findings: Secondary | ICD-10-CM

## 2016-06-21 DIAGNOSIS — I10 Essential (primary) hypertension: Secondary | ICD-10-CM

## 2016-06-21 MED ORDER — LISINOPRIL 20 MG PO TABS: 20.0000 mg | ORAL_TABLET | Freq: Every day | ORAL | 3 refills | Status: DC

## 2016-06-21 MED ORDER — ATORVASTATIN CALCIUM 10 MG PO TABS: 10.0000 mg | ORAL_TABLET | Freq: Every day | ORAL | 3 refills | Status: DC

## 2016-06-21 NOTE — Progress Notes (Signed)
Subjective:    Patient ID: Anita Whitehead, female    DOB: 02-23-1956, 60 y.o.   MRN: [id number]  HPI  Patient presents for yearly preventative medicine examination. She is a pleasant 60 year old female who  has a past medical history of Allergy; Cervical cancer ([place]) (1989); History of colon polyps; Hyperlipidemia; Hypertension; and PMS (premenstrual syndrome).   All immunizations and health maintenance protocols were reviewed with the patient and needed orders were placed. She received her flu vaccination.   Medication reconciliation,  past medical history, social history, problem list and allergies were reviewed in detail with the patient.  Goals were established with regard to weight loss, exercise, and  diet in compliance with medications.She is eating healthy and gets 10,000 steps a day. She does not exercise formally.   End of life planning was discussed.  She stopped taking HCTZ a few months ago as she believes that the mail order HCTZ gave her hives. She has been monitoring her blood pressure at home at reports that it has been between 130-160.   She is up to date on her colonoscopy, mammogram, dental and eye visits. She does self breast exams monthly.     Review of Systems  Constitutional: Negative.   HENT: Negative.   Eyes: Negative.   Respiratory: Negative.   Cardiovascular: Negative.   Gastrointestinal: Negative.   Endocrine: Negative.   Genitourinary: Negative.   Musculoskeletal: Negative.   Skin: Negative.   Allergic/Immunologic: Negative.   Neurological: Negative.   Hematological: Negative.   Psychiatric/Behavioral: Negative.   All other systems reviewed and are negative.  Past Medical History:  Diagnosis Date  . Allergy   . Cervical cancer ([place]) 1989  . History of colon polyps    TA   . Hyperlipidemia   . Hypertension   . PMS (premenstrual syndrome)     Social History   Social History  . Marital status: Married    Spouse name: N/A  . Number  of children: N/A  . Years of education: N/A   Occupational History  . Not on file.   Social History Main Topics  . Smoking status: Former Research scientist (life sciences)  . Smokeless tobacco: Never Used     Comment: hooka  . Alcohol use 0.0 oz/week     Comment: 3 beers a week and 2 cocktails during the week- 1 drink a day   . Drug use: No  . Sexual activity: Not on file   Other Topics Concern  . 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 ABDOMINAL HYSTERECTOMY    . adenomatous colon polyps    . COLONOSCOPY     04-01-2009,2006 with [facility] [place]   . POLYPECTOMY      Family History  Problem Relation Age of Onset  . Diabetes Brother   . Hypertension Mother   . Cancer Mother     Unknown  . Hypertension Sister   . Cancer Sister     Breast, uterine  . Colon cancer Neg Hx   . Colon polyps Neg Hx   . Esophageal cancer Neg Hx   . Rectal cancer Neg Hx   . Stomach cancer Neg Hx     No Known Allergies  Current Outpatient Prescriptions on File Prior to Visit  Medication Sig  Dispense Refill  . aspirin 81 MG tablet Take 81 mg by mouth daily. Reported on 09/02/2015    . cetirizine (ZYRTEC) 10 MG tablet Take 10 mg by mouth daily. Reported on 09/02/2015    . FIBER PO Take by mouth.    Marland Kitchen ipratropium (ATROVENT) 0.03 % nasal spray Place 2 sprays into the nose 2 (two) times daily. 30 mL 5  . Probiotic Product (PROBIOTIC ADVANCED) CAPS Take by mouth.    . hydrochlorothiazide (MICROZIDE) 12.5 MG capsule Take 1 capsule (12.5 mg total) by mouth daily. (Patient not taking: Reported on 06/21/2016) 30 capsule 0   No current facility-administered medications on file prior to visit.     BP (!) 162/90   Temp 98.3 F (36.8 C) (Oral)   Ht 5' 2.5" (1.588 m)   Wt 134 lb 11.2 oz (61.1 kg)   BMI 24.24 kg/m       Objective:   Physical Exam    Constitutional: She is oriented to person, place, and time. She appears well-developed and well-nourished. No distress.  HENT:  Head: Normocephalic and atraumatic.  Right Ear: External ear normal.  Left Ear: External ear normal.  Nose: Nose normal.  Mouth/Throat: Oropharynx is clear and moist. No oropharyngeal exudate.  Eyes: Conjunctivae and EOM are normal. Pupils are equal, round, and reactive to light. Right eye exhibits no discharge. Left eye exhibits no discharge. No scleral icterus.  Neck: Normal range of motion, full passive range of motion without pain and phonation normal. Neck supple. No JVD present. No tracheal tenderness present. Carotid bruit is not present. No tracheal deviation present. No thyromegaly present.  Cardiovascular: Normal rate, regular rhythm, normal heart sounds and intact distal pulses.  Exam reveals no gallop and no friction rub.   No murmur heard. Pulmonary/Chest: Effort normal and breath sounds normal. No stridor. No respiratory distress. She has no wheezes. She has no rales. She exhibits no tenderness.  Abdominal: Soft. Bowel sounds are normal. She exhibits no distension and no mass. There is no tenderness. There is no rebound and no guarding.  Genitourinary:  Genitourinary Comments: Refused  Musculoskeletal: Normal range of motion.  Lymphadenopathy:    She has no cervical adenopathy.  Neurological: She is alert and oriented to person, place, and time. She has normal reflexes. She displays normal reflexes. No cranial nerve deficit. She exhibits normal muscle tone. Coordination normal.  Skin: Skin is warm and dry. No rash noted. She is not diaphoretic. No erythema. No pallor.  Psychiatric: She has a normal mood and affect. Her behavior is normal. Judgment and thought content normal.  Nursing note and vitals reviewed.     Assessment & Plan:  1. Routine general medical examination at a health care facility - Reviewed labs in detail with patient. All questions  answered - educated on the importance of a healthy diet and regular weight baring exercise - Follow up in one year or sooner if needed  2. Need for prophylactic vaccination and inoculation against influenza - Flu Vaccine QUAD 36+ mos IM (Fluarix & Fluzone Quad PF  3. Essential hypertension - Will start on lisinopril 20 mg. Side effects were reviewed with patient.  - She is going to keep a BP log, follow up in 2 weeks or sooner if needed - lisinopril (PRINIVIL,ZESTRIL) 20 MG tablet; Take 1 tablet (20 mg total) by mouth daily.  Dispense: 30 tablet; Refill: 3  4. Mixed hyperlipidemia - Will start on lipitor 10 mg. Side effects of medications reviewed with patient -  atorvastatin (LIPITOR) 10 MG tablet; Take 1 tablet (10 mg total) by mouth daily.  Dispense: 30 tablet; Refill: 3  [name], NP

## 2016-06-21 NOTE — Patient Instructions (Signed)
It was great seeing you today!  I have sent in lisinopril 20 mg for your high blood pressure and Lipitor 10 mg for high cholesterol.   I would like you to follow up with me in two weeks for a blood pressure check. Bring your blood pressure log.   Please let me know if you need anything

## 2016-06-22 ENCOUNTER — Other Ambulatory Visit: Payer: Self-pay | Admitting: Adult Health

## 2016-06-22 DIAGNOSIS — Z1231 Encounter for screening mammogram for malignant neoplasm of breast: Secondary | ICD-10-CM

## 2016-07-05 ENCOUNTER — Encounter: Payer: Self-pay | Admitting: Adult Health

## 2016-07-05 ENCOUNTER — Ambulatory Visit (INDEPENDENT_AMBULATORY_CARE_PROVIDER_SITE_OTHER): Payer: 59 | Admitting: Adult Health

## 2016-07-05 VITALS — BP 132/80 | Temp 97.6°F | Ht 62.5 in | Wt 135.6 lb

## 2016-07-05 DIAGNOSIS — I10 Essential (primary) hypertension: Secondary | ICD-10-CM | POA: Diagnosis not present

## 2016-07-05 NOTE — Progress Notes (Signed)
Subjective:    Patient ID: Anita Whitehead, female    DOB: [birth year], 60 y.o.   MRN: [id number]  HPI  60 year old female who  has a past medical history of Allergy; Cervical cancer ([place]) (1989); History of colon polyps; Hyperlipidemia; Hypertension; and PMS (premenstrual syndrome). She presents to the office today for follow up regarding hypertension. Her last BP was 162/90 at which time she was started on Lisinopril due to stopping HCTZ due to hives.   She has been monitoring her blood pressure at home and reports readings in the low 130's/80. She denies any side effects of lisinopril. She is not having any dizziness or lightheadedness   Review of Systems  Constitutional: Negative.   Respiratory: Negative.   Cardiovascular: Negative.   Genitourinary: Negative.   Neurological: Negative.   Hematological: Negative.   All other systems reviewed and are negative.  Past Medical History:  Diagnosis Date  . Allergy   . Cervical cancer ([place]) 1989  . History of colon polyps    TA   . Hyperlipidemia   . Hypertension   . PMS (premenstrual syndrome)     Social History   Social History  . Marital status: Married    Spouse name: N/A  . Number of children: N/A  . Years of education: N/A   Occupational History  . Not on file.   Social History Main Topics  . Smoking status: Former Research scientist (life sciences)  . Smokeless tobacco: Never Used     Comment: hooka  . Alcohol use 0.0 oz/week     Comment: 3 beers a week and 2 cocktails during the week- 1 drink a day   . Drug use: No  . Sexual activity: Not on file   Other Topics Concern  . 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 ABDOMINAL HYSTERECTOMY    . adenomatous colon polyps    . COLONOSCOPY     04-01-2009,2006 with [facility] [place]   . POLYPECTOMY      Family  History  Problem Relation Age of Onset  . Diabetes Brother   . Hypertension Mother   . Cancer Mother     Unknown  . Hypertension Sister   . Cancer Sister     Breast, uterine  . Colon cancer Neg Hx   . Colon polyps Neg Hx   . Esophageal cancer Neg Hx   . Rectal cancer Neg Hx   . Stomach cancer Neg Hx     No Known Allergies  Current Outpatient Prescriptions on File Prior to Visit  Medication Sig Dispense Refill  . aspirin 81 MG tablet Take 81 mg by mouth daily. Reported on 09/02/2015    . atorvastatin (LIPITOR) 10 MG tablet Take 1 tablet (10 mg total) by mouth daily. 30 tablet 3  . cetirizine (ZYRTEC) 10 MG tablet Take 10 mg by mouth daily. Reported on 09/02/2015    . FIBER PO Take by mouth.    . hydrochlorothiazide (MICROZIDE) 12.5 MG capsule Take 1 capsule (12.5 mg total) by mouth daily. 30 capsule 0  . ipratropium (ATROVENT) 0.03 % nasal spray Place 2 sprays into the nose 2 (two) times daily. 30 mL 5  . lisinopril (PRINIVIL,ZESTRIL) 20 MG tablet Take 1 tablet (20 mg total) by mouth daily. [place]  tablet 3  . Probiotic Product (PROBIOTIC ADVANCED) CAPS Take by mouth.     No current facility-administered medications on file prior to visit.     BP 132/80   Temp 97.6 F (36.4 C) (Oral)   Ht 5' 2.5" (1.588 m)   Wt 135 lb 9.6 oz (61.5 kg)   BMI 24.41 kg/m       Objective:   Physical Exam  Constitutional: She is oriented to person, place, and time. She appears well-developed and well-nourished. No distress.  Eyes: Conjunctivae and EOM are normal. Pupils are equal, round, and reactive to light. Right eye exhibits no discharge. Left eye exhibits no discharge.  Cardiovascular: Normal rate, regular rhythm, normal heart sounds and intact distal pulses.  Exam reveals no gallop and no friction rub.   No murmur heard. Pulmonary/Chest: Effort normal and breath sounds normal. No respiratory distress. She has no wheezes. She has no rales. She exhibits no tenderness.  Musculoskeletal: Normal  range of motion. She exhibits no edema, tenderness or deformity.  Neurological: She is alert and oriented to person, place, and time.  Skin: Skin is warm and dry. No rash noted. She is not diaphoretic. No erythema. No pallor.  Psychiatric: She has a normal mood and affect. Her behavior is normal. Judgment and thought content normal.  Nursing note and vitals reviewed.     Assessment & Plan:  1. Essential hypertension - Will have her take 30 mg of lisinopril at home and monitor blood pressure. She will send me a mychart message with her readings.  - Follow as needed  [name], NP

## 2016-07-24 ENCOUNTER — Ambulatory Visit
Admission: RE | Admit: 2016-07-24 | Discharge: 2016-07-24 | Disposition: A | Discharge status: Home / Self Care | Payer: 59 | Source: Ambulatory Visit | Attending: Adult Health | Admitting: Adult Health

## 2016-07-24 DIAGNOSIS — Z1231 Encounter for screening mammogram for malignant neoplasm of breast: Secondary | ICD-10-CM

## 2016-12-03 ENCOUNTER — Encounter: Payer: Self-pay | Admitting: Adult Health

## 2016-12-05 ENCOUNTER — Other Ambulatory Visit: Payer: Self-pay | Admitting: Adult Health

## 2016-12-05 DIAGNOSIS — M810 Age-related osteoporosis without current pathological fracture: Secondary | ICD-10-CM

## 2016-12-05 DIAGNOSIS — I10 Essential (primary) hypertension: Secondary | ICD-10-CM

## 2016-12-05 NOTE — Telephone Encounter (Signed)
Ok to refill for one year  

## 2017-01-24 ENCOUNTER — Ambulatory Visit: Payer: Self-pay | Admitting: Adult Health

## 2017-01-24 DIAGNOSIS — Z0289 Encounter for other administrative examinations: Secondary | ICD-10-CM

## 2017-01-29 ENCOUNTER — Ambulatory Visit (INDEPENDENT_AMBULATORY_CARE_PROVIDER_SITE_OTHER)
Admission: RE | Admit: 2017-01-29 | Discharge: 2017-01-29 | Disposition: A | Discharge status: Home / Self Care | Payer: 59 | Source: Ambulatory Visit | Attending: Adult Health | Admitting: Adult Health

## 2017-01-29 DIAGNOSIS — M810 Age-related osteoporosis without current pathological fracture: Secondary | ICD-10-CM | POA: Diagnosis not present

## 2017-02-09 ENCOUNTER — Ambulatory Visit (INDEPENDENT_AMBULATORY_CARE_PROVIDER_SITE_OTHER): Payer: 59 | Admitting: Adult Health

## 2017-02-09 ENCOUNTER — Encounter: Payer: Self-pay | Admitting: Adult Health

## 2017-02-09 VITALS — BP 124/76 | Temp 98.0°F | Ht 62.5 in | Wt 129.6 lb

## 2017-02-09 DIAGNOSIS — M5432 Sciatica, left side: Secondary | ICD-10-CM

## 2017-02-09 NOTE — Progress Notes (Signed)
Subjective:    Patient ID: Anita Whitehead, female    DOB: 09/19/1955, 61 y.o.   MRN: [id number]  HPI  61 year old female who  has a past medical history of Allergy; Cervical cancer ([place]) (1989); History of colon polyps; Hyperlipidemia; Hypertension; and PMS (premenstrual syndrome). She presents with the complaint of left sided lower back pain with radiating pain down her left leg. This started 1-2 weeks ago. Since that time she has started exercising, doing stretches and started taking Tumeric. Her sciatica pain has resolved.   She has no other complaints today   Review of Systems  See HPI   Past Medical History:  Diagnosis Date  . Allergy   . Cervical cancer ([place]) 1989  . History of colon polyps    TA   . Hyperlipidemia   . Hypertension   . PMS (premenstrual syndrome)     Social History   Social History  . Marital status: Married    Spouse name: N/A  . Number of children: N/A  . Years of education: N/A   Occupational History  . Not on file.   Social History Main Topics  . Smoking status: Former Research scientist (life sciences)  . Smokeless tobacco: Never Used     Comment: hooka  . Alcohol use 0.0 oz/week     Comment: 3 beers a week and 2 cocktails during the week- 1 drink a day   . Drug use: No  . Sexual activity: Not on file   Other Topics Concern  . 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 ABDOMINAL HYSTERECTOMY    . adenomatous colon polyps    . COLONOSCOPY     04-01-2009,2006 with [facility] [place]   . POLYPECTOMY      Family History  Problem Relation Age of Onset  . Diabetes Brother   . Hypertension Mother   . Cancer Mother        Unknown  . Hypertension Sister   . Cancer Sister        Breast, uterine  . Colon cancer Neg Hx   . Colon polyps Neg Hx   . Esophageal cancer Neg Hx   . Rectal  cancer Neg Hx   . Stomach cancer Neg Hx     No Known Allergies  Current Outpatient Prescriptions on File Prior to Visit  Medication Sig Dispense Refill  . aspirin 81 MG tablet Take 81 mg by mouth daily. Reported on 09/02/2015    . atorvastatin (LIPITOR) 10 MG tablet Take 1 tablet (10 mg total) by mouth daily. 30 tablet 3  . cetirizine (ZYRTEC) 10 MG tablet Take 10 mg by mouth daily. Reported on 09/02/2015    . FIBER PO Take by mouth.    . hydrochlorothiazide (MICROZIDE) 12.5 MG capsule Take 1 capsule (12.5 mg total) by mouth daily. 30 capsule 0  . ipratropium (ATROVENT) 0.03 % nasal spray Place 2 sprays into the nose 2 (two) times daily. 30 mL 5  . lisinopril (PRINIVIL,ZESTRIL) 20 MG tablet take 1 tablet by mouth once daily 90 tablet 3  . Probiotic Product (PROBIOTIC ADVANCED) CAPS Take by mouth.     No current facility-administered medications on file prior to visit.     BP 124/76 (BP Location: Left Arm, Patient Position: Sitting, Cuff  Size: Normal)   Temp 98 F (36.7 C) (Oral)   Ht 5' 2.5" (1.588 m)   Wt 129 lb 9.6 oz (58.8 kg)   BMI 23.33 kg/m       Objective:   Physical Exam  Constitutional: She is oriented to person, place, and time. She appears well-developed and well-nourished. No distress.  Musculoskeletal: Normal range of motion. She exhibits no edema or tenderness.  Neurological: She is alert and oriented to person, place, and time.  Skin: Skin is warm and dry. No rash noted. She is not diaphoretic. No erythema. No pallor.  Psychiatric: She has a normal mood and affect. Her behavior is normal. Judgment and thought content normal.  Nursing note and vitals reviewed.     Assessment & Plan:  1. Sciatica of left side - Appears to have resolved - Continue with current treatment  - Follow up as needed  [name], NP

## 2017-04-10 ENCOUNTER — Telehealth: Payer: Self-pay | Admitting: Adult Health

## 2017-04-10 NOTE — Telephone Encounter (Signed)
Pt would like to have a call to discuss the pain that she is having in her L hip.    Pt has been scheduled.

## 2017-04-11 ENCOUNTER — Ambulatory Visit (INDEPENDENT_AMBULATORY_CARE_PROVIDER_SITE_OTHER): Payer: 59 | Admitting: Adult Health

## 2017-04-11 ENCOUNTER — Encounter: Payer: Self-pay | Admitting: Adult Health

## 2017-04-11 VITALS — BP 150/94 | Temp 99.0°F | Ht 62.5 in | Wt 123.0 lb

## 2017-04-11 DIAGNOSIS — M79605 Pain in left leg: Secondary | ICD-10-CM

## 2017-04-11 NOTE — Progress Notes (Signed)
Subjective:    Patient ID: Anita Whitehead, female    DOB: 11-Mar-1956, 61 y.o.   MRN: [id number]  HPI  61 year old female who  has a past medical history of Allergy; Cervical cancer ([place]) (1989); History of colon polyps; Hyperlipidemia; Hypertension; and PMS (premenstrual syndrome). She presents to the office today for soreness in her left thigh. She reports that this soreness has been present for about 6 weeks. Soreness is worse at night and is associated with cramps in the thigh and calf.   She has not started any new exercise regimen.  She denies any trauma or bruising    Review of Systems See HPI   Past Medical History:  Diagnosis Date  . Allergy   . Cervical cancer ([place]) 1989  . History of colon polyps    TA   . Hyperlipidemia   . Hypertension   . PMS (premenstrual syndrome)     Social History   Social History  . Marital status: Married    Spouse name: N/A  . Number of children: N/A  . Years of education: N/A   Occupational History  . Not on file.   Social History Main Topics  . Smoking status: Former Research scientist (life sciences)  . Smokeless tobacco: Never Used     Comment: hooka  . Alcohol use 0.0 oz/week     Comment: 3 beers a week and 2 cocktails during the week- 1 drink a day   . Drug use: No  . Sexual activity: Not on file   Other Topics Concern  . 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 ABDOMINAL HYSTERECTOMY    . adenomatous colon polyps    . COLONOSCOPY     04-01-2009,2006 with S. [facility]   . POLYPECTOMY      Family History  Problem Relation Age of Onset  . Diabetes Brother   . Hypertension Mother   . Cancer Mother        Unknown  . Hypertension Sister   . Cancer Sister        Breast, uterine  . Colon cancer Neg Hx   . Colon polyps Neg Hx   . Esophageal cancer Neg Hx   .  Rectal cancer Neg Hx   . Stomach cancer Neg Hx     No Known Allergies  Current Outpatient Prescriptions on File Prior to Visit  Medication Sig Dispense Refill  . aspirin 81 MG tablet Take 81 mg by mouth daily. Reported on 09/02/2015    . atorvastatin (LIPITOR) 10 MG tablet Take 1 tablet (10 mg total) by mouth daily. 30 tablet 3  . cetirizine (ZYRTEC) 10 MG tablet Take 10 mg by mouth daily. Reported on 09/02/2015    . FIBER PO Take by mouth.    . hydrochlorothiazide (MICROZIDE) 12.5 MG capsule Take 1 capsule (12.5 mg total) by mouth daily. 30 capsule 0  . ipratropium (ATROVENT) 0.03 % nasal spray Place 2 sprays into the nose 2 (two) times daily. 30 mL 5  . lisinopril (PRINIVIL,ZESTRIL) 20 MG tablet take 1 tablet by mouth once daily 90 tablet 3  . Probiotic Product (PROBIOTIC ADVANCED) CAPS Take by mouth.     No current facility-administered medications on file prior to visit.     BP (!) 150/94 (  BP Location: Left Arm)   Temp 99 F (37.2 C)   Ht 5' 2.5" (1.588 m)   Wt 123 lb (55.8 kg)   BMI 22.14 kg/m       Objective:   Physical Exam  Constitutional: She is oriented to person, place, and time. She appears well-developed and well-nourished. No distress.  Cardiovascular: Normal rate, regular rhythm, normal heart sounds and intact distal pulses.  Exam reveals no gallop.   No murmur heard. Pulmonary/Chest: Effort normal and breath sounds normal.  Musculoskeletal: She exhibits tenderness (with palpation to left thigh. No tenderness to right thigh. No bruising noted. ). She exhibits no edema or deformity.  Neurological: She is alert and oriented to person, place, and time.  Skin: Skin is warm and dry. No rash noted. She is not diaphoretic. No erythema. No pallor.  Psychiatric: She has a normal mood and affect. Her behavior is normal. Judgment and thought content normal.  Vitals reviewed.     Assessment & Plan:  1. Left leg pain - possible statin intolerance? I will have her stop  stain for 2-4 weeks and see if soreness resolves. Can restart every other day. Follow up if no improvement or if soreness returns after statin vacation   [name], NP

## 2017-04-11 NOTE — Patient Instructions (Signed)
I believe it is Lipitor that is causing your discomfort.   Please stop this medication for the next 2-4 weeks. If the soreness goes away then restart the lipitor but take it every other day. Please let me know if the soreness/cramps do not resolve   Continue to stretch

## 2017-04-11 NOTE — Telephone Encounter (Signed)
Pt seen in [place] today and discussed with [facility].

## 2017-04-24 ENCOUNTER — Encounter: Payer: Self-pay | Admitting: Adult Health

## 2017-04-27 ENCOUNTER — Encounter: Payer: Self-pay | Admitting: Adult Health

## 2017-05-14 ENCOUNTER — Encounter: Payer: Self-pay | Admitting: Adult Health

## 2017-05-15 ENCOUNTER — Ambulatory Visit (HOSPITAL_COMMUNITY)
Admission: RE | Admit: 2017-05-15 | Discharge: 2017-05-15 | Disposition: A | Discharge status: Home / Self Care | Payer: 59 | Source: Ambulatory Visit | Attending: Adult Health | Admitting: Adult Health

## 2017-05-15 ENCOUNTER — Encounter: Payer: Self-pay | Admitting: Adult Health

## 2017-05-15 ENCOUNTER — Ambulatory Visit (INDEPENDENT_AMBULATORY_CARE_PROVIDER_SITE_OTHER): Payer: 59 | Admitting: Adult Health

## 2017-05-15 VITALS — BP 182/96 | HR 80 | Temp 98.3°F | Ht 62.5 in | Wt 117.0 lb

## 2017-05-15 DIAGNOSIS — M7989 Other specified soft tissue disorders: Secondary | ICD-10-CM | POA: Insufficient documentation

## 2017-05-15 NOTE — Progress Notes (Signed)
She does not have a DVT  Please take blood pressure medications   I am not 100% sure what is causing her leg to swell. I am going to do some research

## 2017-05-15 NOTE — Progress Notes (Signed)
Subjective:    Patient ID: Graylin Shiver, female    DOB: Feb 03, 1956, 61 y.o.   MRN: [id number]  HPI  61 year old female who  has a past medical history of Allergy; Cervical cancer ([place]) (1989); History of colon polyps; Hyperlipidemia; Hypertension; and PMS (premenstrual syndrome). She presents to the office today with continued " cramps and soreness" in her left thigh as well as new onset swelling in her left calf. I last saw her about one month ago for the complaint of cramping in her left thigh and calf. She was advised to stop her statin medication at this time, which she reports doing. Since stopping the statin she reports that the cramps and pain has become less severe and frequent.   Over the last 3-4 days she has noticed significant swelling in her left calf. She denies any pain, redness, or warmth.   She denies any CP or SOB   She did not take her blood pressure medication this morning   Review of Systems See HPI   Past Medical History:  Diagnosis Date  . Allergy   . Cervical cancer ([place]) 1989  . History of colon polyps    TA   . Hyperlipidemia   . Hypertension   . PMS (premenstrual syndrome)     Social History   Social History  . Marital status: Married    Spouse name: N/A  . Number of children: N/A  . Years of education: N/A   Occupational History  . Not on file.   Social History Main Topics  . Smoking status: Former Research scientist (life sciences)  . Smokeless tobacco: Never Used     Comment: hooka  . Alcohol use 0.0 oz/week     Comment: 3 beers a week and 2 cocktails during the week- 1 drink a day   . Drug use: No  . Sexual activity: Not on file   Other Topics Concern  . 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 ABDOMINAL HYSTERECTOMY    . adenomatous colon polyps    . COLONOSCOPY     04-01-2009,2006 with S. [facility]   . POLYPECTOMY      Family History  Problem Relation Age of Onset  . Diabetes Brother   . Hypertension Mother   . Cancer Mother        Unknown  . Hypertension Sister   . Cancer Sister        Breast, uterine  . Colon cancer Neg Hx   . Colon polyps Neg Hx   . Esophageal cancer Neg Hx   . Rectal cancer Neg Hx   . Stomach cancer Neg Hx     No Known Allergies  Current Outpatient Prescriptions on File Prior to Visit  Medication Sig Dispense Refill  . aspirin 81 MG tablet Take 81 mg by mouth daily. Reported on 09/02/2015    . atorvastatin (LIPITOR) 10 MG tablet Take 1 tablet (10 mg total) by mouth daily. 30 tablet 3  . Calcium Carb-Cholecalciferol (CALCIUM-VITAMIN D3) 600-400 MG-UNIT CAPS Take 2 capsules by mouth daily.    . cetirizine (ZYRTEC) 10 MG tablet Take 10 mg by mouth daily. Reported on 09/02/2015    . FIBER PO Take by mouth.    . hydrochlorothiazide (MICROZIDE) 12.5 MG capsule Take  1 capsule (12.5 mg total) by mouth daily. 30 capsule 0  . ipratropium (ATROVENT) 0.03 % nasal spray Place 2 sprays into the nose 2 (two) times daily. 30 mL 5  . lisinopril (PRINIVIL,ZESTRIL) 20 MG tablet take 1 tablet by mouth once daily 90 tablet 3  . Probiotic Product (PROBIOTIC ADVANCED) CAPS Take by mouth.     No current facility-administered medications on file prior to visit.     BP (!) 182/96 (BP Location: Left Arm)   Pulse 80   Temp 98.3 F (36.8 C) (Oral)   Ht 5' 2.5" (1.588 m)   Wt 117 lb (53.1 kg)   SpO2 98%   BMI 21.06 kg/m       Objective:   Physical Exam  Constitutional: She is oriented to person, place, and time. She appears well-developed and well-nourished. No distress.  Cardiovascular: Normal rate, regular rhythm and intact distal pulses.  Exam reveals no gallop and no friction rub.   No murmur heard. Pulmonary/Chest: Effort normal and breath sounds normal. No respiratory distress. She has no wheezes. She has no rales.    Musculoskeletal: Normal range of motion. She exhibits tenderness. She exhibits no edema (no pitting edema noted ).  Right calf 12 in in diameter Left Calf 13 in in diameter.   Neurological: She is alert and oriented to person, place, and time.  Skin: Skin is warm and dry. No rash noted. She is not diaphoretic. No erythema. No pallor.  Psychiatric: She has a normal mood and affect. Her behavior is normal. Judgment and thought content normal.  Nursing note and vitals reviewed.     Assessment & Plan:  1. Localized swelling of lower extremity - Unknown cause of left leg swelling. She does take a diuretic but this does not appear as edema. Cannot r/o DVT but would expect redness and warmth with DVT . Will get [place]  - VAS [place] LOWER EXTREMITY VENOUS (DVT); Future - take blood pressure medications when she gets home   [name], NP

## 2017-05-15 NOTE — Progress Notes (Signed)
*  PRELIMINARY RESULTS* Vascular Ultrasound Left lower ext venous duplex has been completed.  Preliminary findings: No evidence of DVT or baker's cyst.   Called results to [facility].   Landry Mellow, RDMS, RVT  05/15/2017, 1:08 PM

## 2017-05-23 ENCOUNTER — Encounter (INDEPENDENT_AMBULATORY_CARE_PROVIDER_SITE_OTHER): Payer: Self-pay | Admitting: Physician Assistant

## 2017-05-23 ENCOUNTER — Ambulatory Visit (INDEPENDENT_AMBULATORY_CARE_PROVIDER_SITE_OTHER): Payer: 59

## 2017-05-23 ENCOUNTER — Ambulatory Visit (INDEPENDENT_AMBULATORY_CARE_PROVIDER_SITE_OTHER): Payer: 59 | Admitting: Physician Assistant

## 2017-05-23 DIAGNOSIS — M7062 Trochanteric bursitis, left hip: Secondary | ICD-10-CM

## 2017-05-23 DIAGNOSIS — M25552 Pain in left hip: Secondary | ICD-10-CM | POA: Diagnosis not present

## 2017-05-23 MED ORDER — LIDOCAINE HCL 1 % IJ SOLN
3.0000 mL | INTRAMUSCULAR | Status: AC | PRN
  Administered 2017-05-23: 3 mL

## 2017-05-23 MED ORDER — METHYLPREDNISOLONE ACETATE 40 MG/ML IJ SUSP
40.0000 mg | INTRAMUSCULAR | Status: AC | PRN
  Administered 2017-05-23: 40 mg via INTRA_ARTICULAR

## 2017-05-23 NOTE — Progress Notes (Signed)
Office Visit Note   Patient: Anita Whitehead           Date of Birth: [birth year]           MRN: [id number] Visit Date: 05/23/2017              Requested by: [name], NP [address], [place] [address] PCP: [name], NP   Assessment & Plan: Visit Diagnoses:  1. Pain in left hip   2. Trochanteric bursitis, left hip     Plan: IT band stretching exercises shown and had patient demonstrated. She'll follow with [place] in 2 weeks' check progress lack of. If she is doing well she can call and cancel the appointment.  Follow-Up Instructions: Return in about 2 weeks (around 06/06/2017).   Orders:  Orders Placed This Encounter  Procedures  . Large Joint Injection/Arthrocentesis  . XR HIP UNILAT W OR W/O PELVIS 1V LEFT   No orders of the defined types were placed in this encounter.     Procedures: Large Joint Inj Date/Time: 05/23/2017 5:10 PM Performed by: Pete Pelt Authorized by: Pete Pelt   Consent Given by:  Patient Indications:  Pain Location:  Hip Site:  L greater trochanter Needle Size:  22 G Needle Length:  1.5 inches Approach:  Lateral Ultrasound Guidance: No   Fluoroscopic Guidance: No   Arthrogram: No   Medications:  40 mg methylPREDNISolone acetate 40 MG/ML; 3 mL lidocaine 1 % Aspiration Attempted: No   Patient tolerance:  Patient tolerated the procedure well with no immediate complications     Clinical Data: No additional findings.   Subjective: Chief Complaint  Patient presents with  . Left Hip - Pain    HPI Patient is 61 year old female comes in today for left hip pain. Pain is been ongoing for a couple weeks. No injury. Denies any numbness tingling down the leg. She has pain about the hip and groin. Worse at night when lying on the hip. She's tried Aleve and Tumeric without any relief.She denies any back pain. Review of Systems No chest pain shortness breath fevers or chills.  Objective: Vital Signs:  There were no vitals taken for this visit.  Physical Exam  Constitutional: She is oriented to person, place, and time. She appears well-developed and well-nourished. No distress.  Cardiovascular: Intact distal pulses.   Neurological: She is alert and oriented to person, place, and time.  Skin: She is not diaphoretic.  Psychiatric: She has a normal mood and affect.    Ortho Exam Good range of motion of both hips. She has tenderness over the left trochanteric region and down the IT band. 5 out of 5 strength throughout the lower extremities against resistance. Negative straight leg raise bilaterally. Specialty Comments:  No specialty comments available.  Imaging: Xr Hip Unilat W Or W/o Pelvis 1v Left  Result Date: 05/23/2017 AP pelvis and lateral view left hip: No acute fracture. Left hips well located. Left hip joints well preserved.    PMFS History: Patient Active Problem List   Diagnosis Date Noted  . Essential hypertension 05/06/2014  . Atrophic vaginitis 04/02/2012  . Routine general medical examination at a health care facility 04/02/2012  . HEARTBURN 02/24/2009  . 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 Hyperlipidemia 05/14/2007  . ALLERGIC RHINITIS 05/14/2007   Past Medical History:  Diagnosis Date  . Allergy   . Cervical cancer ([place]) 1989  . History of colon polyps    TA   .  Hyperlipidemia   . Hypertension   . PMS (premenstrual syndrome)     Family History  Problem Relation Age of Onset  . Diabetes Brother   . Hypertension Mother   . Cancer Mother        Unknown  . Hypertension Sister   . Cancer Sister        Breast, uterine  . Colon cancer Neg Hx   . Colon polyps Neg Hx   . Esophageal cancer Neg Hx   . Rectal cancer Neg Hx   . Stomach cancer Neg Hx     Past Surgical History:  Procedure Laterality Date  . ABDOMINAL HYSTERECTOMY    . adenomatous colon polyps    . COLONOSCOPY     04-01-2009,2006 with S. [facility]   . POLYPECTOMY     Social  History   Occupational History  . Not on file.   Social History Main Topics  . Smoking status: Former Research scientist (life sciences)  . Smokeless tobacco: Never Used     Comment: hooka  . Alcohol use 0.0 oz/week     Comment: 3 beers a week and 2 cocktails during the week- 1 drink a day   . Drug use: No  . Sexual activity: Not on file

## 2017-05-30 ENCOUNTER — Encounter: Payer: Self-pay | Admitting: Adult Health

## 2017-05-31 ENCOUNTER — Ambulatory Visit (HOSPITAL_COMMUNITY)
Admission: RE | Admit: 2017-05-31 | Discharge: 2017-05-31 | Disposition: A | Discharge status: Home / Self Care | Payer: 59 | Source: Ambulatory Visit | Attending: Surgery | Admitting: Surgery

## 2017-05-31 ENCOUNTER — Emergency Department (HOSPITAL_COMMUNITY): Payer: 59

## 2017-05-31 ENCOUNTER — Other Ambulatory Visit: Payer: Self-pay

## 2017-05-31 ENCOUNTER — Ambulatory Visit: Payer: 59 | Admitting: Adult Health

## 2017-05-31 ENCOUNTER — Ambulatory Visit (INDEPENDENT_AMBULATORY_CARE_PROVIDER_SITE_OTHER): Payer: 59

## 2017-05-31 ENCOUNTER — Encounter (HOSPITAL_COMMUNITY): Payer: Self-pay

## 2017-05-31 ENCOUNTER — Encounter (HOSPITAL_COMMUNITY): Payer: Self-pay | Admitting: *Deleted

## 2017-05-31 ENCOUNTER — Encounter (INDEPENDENT_AMBULATORY_CARE_PROVIDER_SITE_OTHER): Payer: Self-pay | Admitting: Surgery

## 2017-05-31 ENCOUNTER — Telehealth (INDEPENDENT_AMBULATORY_CARE_PROVIDER_SITE_OTHER): Payer: Self-pay | Admitting: *Deleted

## 2017-05-31 ENCOUNTER — Ambulatory Visit (INDEPENDENT_AMBULATORY_CARE_PROVIDER_SITE_OTHER): Payer: 59 | Admitting: Surgery

## 2017-05-31 ENCOUNTER — Observation Stay (HOSPITAL_COMMUNITY)
Admission: EM | Admit: 2017-05-31 | Discharge: 2017-06-03 | Disposition: A | Discharge status: Home / Self Care | Payer: 59 | Attending: Family Medicine | Admitting: Family Medicine

## 2017-05-31 ENCOUNTER — Other Ambulatory Visit (INDEPENDENT_AMBULATORY_CARE_PROVIDER_SITE_OTHER): Payer: Self-pay

## 2017-05-31 VITALS — BP 133/89 | HR 101

## 2017-05-31 DIAGNOSIS — G8929 Other chronic pain: Secondary | ICD-10-CM | POA: Insufficient documentation

## 2017-05-31 DIAGNOSIS — Z79891 Long term (current) use of opiate analgesic: Secondary | ICD-10-CM | POA: Diagnosis not present

## 2017-05-31 DIAGNOSIS — E785 Hyperlipidemia, unspecified: Secondary | ICD-10-CM | POA: Insufficient documentation

## 2017-05-31 DIAGNOSIS — R1900 Intra-abdominal and pelvic swelling, mass and lump, unspecified site: Principal | ICD-10-CM | POA: Diagnosis present

## 2017-05-31 DIAGNOSIS — Z87891 Personal history of nicotine dependence: Secondary | ICD-10-CM | POA: Insufficient documentation

## 2017-05-31 DIAGNOSIS — R52 Pain, unspecified: Secondary | ICD-10-CM | POA: Diagnosis present

## 2017-05-31 DIAGNOSIS — M25552 Pain in left hip: Secondary | ICD-10-CM | POA: Insufficient documentation

## 2017-05-31 DIAGNOSIS — I10 Essential (primary) hypertension: Secondary | ICD-10-CM | POA: Diagnosis present

## 2017-05-31 DIAGNOSIS — M48061 Spinal stenosis, lumbar region without neurogenic claudication: Secondary | ICD-10-CM | POA: Diagnosis not present

## 2017-05-31 DIAGNOSIS — Z79899 Other long term (current) drug therapy: Secondary | ICD-10-CM | POA: Insufficient documentation

## 2017-05-31 DIAGNOSIS — M5116 Intervertebral disc disorders with radiculopathy, lumbar region: Secondary | ICD-10-CM | POA: Diagnosis not present

## 2017-05-31 DIAGNOSIS — I739 Peripheral vascular disease, unspecified: Secondary | ICD-10-CM

## 2017-05-31 DIAGNOSIS — M5416 Radiculopathy, lumbar region: Secondary | ICD-10-CM

## 2017-05-31 DIAGNOSIS — C539 Malignant neoplasm of cervix uteri, unspecified: Secondary | ICD-10-CM | POA: Diagnosis not present

## 2017-05-31 DIAGNOSIS — Z23 Encounter for immunization: Secondary | ICD-10-CM | POA: Diagnosis not present

## 2017-05-31 DIAGNOSIS — M79652 Pain in left thigh: Secondary | ICD-10-CM | POA: Diagnosis not present

## 2017-05-31 HISTORY — DX: Intra-abdominal and pelvic swelling, mass and lump, unspecified site: R19.00

## 2017-05-31 LAB — COMPREHENSIVE METABOLIC PANEL
ALBUMIN: 4.3 g/dL (ref 3.5–5.0)
ALT: 19 U/L (ref 14–54)
ANION GAP: 11 (ref 5–15)
AST: 18 U/L (ref 15–41)
Alkaline Phosphatase: 66 U/L (ref 38–126)
BUN: 10 mg/dL (ref 6–20)
CHLORIDE: 102 mmol/L (ref 101–111)
CO2: 22 mmol/L (ref 22–32)
Calcium: 9.6 mg/dL (ref 8.9–10.3)
Creatinine, Ser: 0.73 mg/dL (ref 0.44–1.00)
GFR calc non Af Amer: >60 mL/min (ref >60)
GLUCOSE: 100 mg/dL — AB (ref 65–99)
POTASSIUM: 4.3 mmol/L (ref 3.5–5.1)
SODIUM: 135 mmol/L (ref 135–145)
Total Bilirubin: 1.3 mg/dL — ABNORMAL HIGH (ref 0.3–1.2)
Total Protein: 7.1 g/dL (ref 6.5–8.1)

## 2017-05-31 LAB — CBC WITH DIFFERENTIAL/PLATELET
BASOS PCT: 0 %
Basophils Absolute: 0 10*3/uL (ref 0.0–0.1)
EOS ABS: 0 10*3/uL (ref 0.0–0.7)
EOS PCT: 0 %
HCT: 40.5 % (ref 36.0–46.0)
HEMOGLOBIN: 13.9 g/dL (ref 12.0–15.0)
Lymphocytes Relative: 17 %
Lymphs Abs: 2.3 10*3/uL (ref 0.7–4.0)
MCH: 31 pg (ref 26.0–34.0)
MCHC: 34.3 g/dL (ref 30.0–36.0)
MCV: 90.4 fL (ref 78.0–100.0)
MONOS PCT: 6 %
Monocytes Absolute: 0.9 10*3/uL (ref 0.1–1.0)
NEUTROS PCT: 77 %
Neutro Abs: 10.4 10*3/uL — ABNORMAL HIGH (ref 1.7–7.7)
PLATELETS: 245 10*3/uL (ref 150–400)
RBC: 4.48 MIL/uL (ref 3.87–5.11)
RDW: 13.1 % (ref 11.5–15.5)
WBC: 13.6 10*3/uL — ABNORMAL HIGH (ref 4.0–10.5)

## 2017-05-31 LAB — I-STAT CHEM 8, ED
BUN: 12 mg/dL (ref 6–20)
CALCIUM ION: 1.14 mmol/L — AB (ref 1.15–1.40)
Chloride: 102 mmol/L (ref 101–111)
Creatinine, Ser: 0.6 mg/dL (ref 0.44–1.00)
GLUCOSE: 107 mg/dL — AB (ref 65–99)
HEMATOCRIT: 43 % (ref 36.0–46.0)
Hemoglobin: 14.6 g/dL (ref 12.0–15.0)
Potassium: 4.4 mmol/L (ref 3.5–5.1)
Sodium: 138 mmol/L (ref 135–145)
TCO2: 26 mmol/L (ref 22–32)

## 2017-05-31 MED ORDER — HYDROMORPHONE HCL 1 MG/ML IJ SOLN
1.0000 mg | Freq: Once | INTRAMUSCULAR | Status: AC
  Administered 2017-05-31: 1 mg via INTRAVENOUS
  Filled 2017-05-31: qty 1

## 2017-05-31 MED ORDER — BISACODYL 5 MG PO TBEC: 5.0000 mg | DELAYED_RELEASE_TABLET | Freq: Every day | ORAL | Status: DC | PRN

## 2017-05-31 MED ORDER — HYDROCODONE-ACETAMINOPHEN 5-325 MG PO TABS: 1.0000 | ORAL_TABLET | Freq: Three times a day (TID) | ORAL | 0 refills | Status: DC | PRN

## 2017-05-31 MED ORDER — ONDANSETRON HCL 4 MG PO TABS: 4.0000 mg | ORAL_TABLET | Freq: Four times a day (QID) | ORAL | Status: DC | PRN

## 2017-05-31 MED ORDER — GADOBENATE DIMEGLUMINE 529 MG/ML IV SOLN
12.0000 mL | Freq: Once | INTRAVENOUS | Status: AC
  Administered 2017-05-31: 12 mL via INTRAVENOUS

## 2017-05-31 MED ORDER — FENTANYL CITRATE (PF) 100 MCG/2ML IJ SOLN
50.0000 ug | INTRAMUSCULAR | Status: DC | PRN
  Administered 2017-05-31: 50 ug via NASAL

## 2017-05-31 MED ORDER — MAGNESIUM CITRATE PO SOLN: 1.0000 | Freq: Once | ORAL | Status: DC | PRN

## 2017-05-31 MED ORDER — ONDANSETRON HCL 4 MG/2ML IJ SOLN: 4.0000 mg | Freq: Four times a day (QID) | INTRAMUSCULAR | Status: DC | PRN

## 2017-05-31 MED ORDER — FENTANYL CITRATE (PF) 100 MCG/2ML IJ SOLN
INTRAMUSCULAR | Status: AC
  Filled 2017-05-31: qty 2

## 2017-05-31 MED ORDER — HYDROCODONE-ACETAMINOPHEN 5-325 MG PO TABS
1.0000 | ORAL_TABLET | ORAL | Status: DC | PRN
  Administered 2017-06-01 (×2): 2 via ORAL
  Administered 2017-06-01: 1 via ORAL
  Administered 2017-06-02 – 2017-06-03 (×6): 2 via ORAL
  Filled 2017-05-31 (×8): qty 2
  Filled 2017-05-31: qty 1

## 2017-05-31 MED ORDER — ACETAMINOPHEN 650 MG RE SUPP: 650.0000 mg | Freq: Four times a day (QID) | RECTAL | Status: DC | PRN

## 2017-05-31 MED ORDER — SODIUM CHLORIDE 0.9 % IV SOLN
INTRAVENOUS | Status: AC
  Administered 2017-06-01: 01:00:00 via INTRAVENOUS

## 2017-05-31 MED ORDER — HYDRALAZINE HCL 20 MG/ML IJ SOLN: 10.0000 mg | INTRAMUSCULAR | Status: DC | PRN

## 2017-05-31 MED ORDER — HYDROMORPHONE HCL 1 MG/ML IJ SOLN
1.0000 mg | INTRAMUSCULAR | Status: DC | PRN
  Administered 2017-06-01 – 2017-06-02 (×7): 1 mg via INTRAVENOUS
  Filled 2017-05-31 (×7): qty 1

## 2017-05-31 MED ORDER — ACETAMINOPHEN 325 MG PO TABS
650.0000 mg | ORAL_TABLET | Freq: Four times a day (QID) | ORAL | Status: DC | PRN
  Filled 2017-05-31: qty 2

## 2017-05-31 MED ORDER — SENNOSIDES-DOCUSATE SODIUM 8.6-50 MG PO TABS: 1.0000 | ORAL_TABLET | Freq: Every evening | ORAL | Status: DC | PRN

## 2017-05-31 NOTE — ED Notes (Signed)
Pt back from MRI 

## 2017-05-31 NOTE — H&P (Signed)
History and Physical    Anita Whitehead JKK:[id number] DOB: March 01, 1956 DOA: 05/31/2017  PCP: [name], NP   Patient coming from: Home  Chief Complaint: Intractable left hip/buttock pain   HPI: Anita Whitehead is a 61 y.o. female with medical history significant for hypertension and chronic low back pain with radiculopathy, now presenting to the emergency department for evaluation of intractable pain in the left hip/buttock.  She first noticed this at the beginning of September, but it was only with walking at that point and has gradually worsened.  Patient has been under the care of an outpatient physician, was noted to have a left trochanteric bursitis 2 weeks ago, treated with a steroid injection, but she followed up again today due to continued and worsening pain.  She was sent for outpatient MRI, but was unable to complete it due to intractable pain.  A gross abnormality was noted within the pelvis on the incomplete MRI.  She was then referred to the emergency department for pain control and completion of the MRI.  ED Course: Upon arrival to the ED, patient is found to be afebrile, saturating well on room air, slightly hypertensive, and with vitals otherwise stable.  MRI reveals a large ovoid mass within the left pelvis at the level of the iliac is, possibly representing sarcomatous or neurogenic tumor. Also noted on the MRI is degenerative disease in the lumbar spine with mild canal stenosis at L5-S1.  Blood work features a leukocytosis to 13,600.  Patient was treated with multiple doses of IV Dilaudid but continues to be in unbearable pain.  She will be observed on the medical-surgical unit for ongoing evaluation and management of left pelvic mass with intractable pain.   Review of Systems:  All other systems reviewed and apart from HPI, are negative.  Past Medical History:  Diagnosis Date  . Allergy   . Cervical cancer ([place]) 1989  . History of colon polyps    TA   .  Hyperlipidemia   . Hypertension   . PMS (premenstrual syndrome)     Past Surgical History:  Procedure Laterality Date  . ABDOMINAL HYSTERECTOMY    . adenomatous colon polyps    . COLONOSCOPY     04-01-2009,2006 with [facility]   . POLYPECTOMY       reports that she has quit smoking. She has never used smokeless tobacco. She reports that she drinks alcohol. She reports that she does not use drugs.  No Known Allergies  Family History  Problem Relation Age of Onset  . Diabetes Brother   . Hypertension Mother   . Cancer Mother        Unknown  . Hypertension Sister   . Cancer Sister        Breast, uterine  . Colon cancer Neg Hx   . Colon polyps Neg Hx   . Esophageal cancer Neg Hx   . Rectal cancer Neg Hx   . Stomach cancer Neg Hx      Prior to Admission medications   Medication Sig Start Date End Date Taking? Authorizing Provider  HYDROcodone-acetaminophen (NORCO/VICODIN) 5-325 MG tablet Take 1 tablet by mouth every 8 (eight) hours as needed for moderate pain. 05/31/17  Yes Lanae Crumbly, PA-C  lisinopril (PRINIVIL,ZESTRIL) 20 MG tablet take 1 tablet by mouth once daily 12/05/16  Yes [name], NP    Physical Exam: Vitals:   05/31/17 1800 05/31/17 1848 05/31/17 2309 05/31/17 2310  BP:  119/82 (!) 141/93  Pulse: 84 78  95  Resp:      Temp:      TempSrc:      SpO2: 98% 100%  99%      Constitutional: NAD, calm, in obvious discomfort  Eyes: PERTLA, lids and conjunctivae normal ENMT: Mucous membranes are moist. Posterior pharynx clear of any exudate or lesions.   Neck: normal, supple, no masses, no thyromegaly Respiratory: clear to auscultation bilaterally, no wheezing, no crackles. Normal respiratory effort.    Cardiovascular: S1 & S2 heard, regular rate and rhythm. No extremity edema. No significant JVD. Abdomen: No distension, no tenderness, no masses palpated. Bowel sounds normal.  Musculoskeletal: no clubbing / cyanosis. No joint deformity upper and lower  extremities.  Skin: no significant rashes, lesions, ulcers. Warm, dry, well-perfused. Neurologic: CN 2-12 grossly intact. Sensation intact. Strength 5/5 in all 4 limbs.  Psychiatric: Alert and oriented x 3. Calm, cooperative.     Labs on Admission: I have personally reviewed following labs and imaging studies  CBC:  Recent Labs Lab 05/31/17 1832 05/31/17 2316  WBC  --  13.6*  NEUTROABS  --  10.4*  HGB 14.6 13.9  HCT 43.0 40.5  MCV  --  90.4  PLT  --  295   Basic Metabolic Panel:  Recent Labs Lab 05/31/17 1832  NA 138  K 4.4  CL 102  GLUCOSE 107*  BUN 12  CREATININE 0.60   GFR: CrCl cannot be calculated (Unknown ideal weight.). Liver Function Tests: No results for input(s): AST, ALT, ALKPHOS, BILITOT, PROT, ALBUMIN in the last 168 hours. No results for input(s): LIPASE, AMYLASE in the last 168 hours. No results for input(s): AMMONIA in the last 168 hours. Coagulation Profile: No results for input(s): INR, PROTIME in the last 168 hours. Cardiac Enzymes: No results for input(s): CKTOTAL, CKMB, CKMBINDEX, TROPONINI in the last 168 hours. BNP (last 3 results) No results for input(s): PROBNP in the last 8760 hours. HbA1C: No results for input(s): HGBA1C in the last 72 hours. CBG: No results for input(s): GLUCAP in the last 168 hours. Lipid Profile: No results for input(s): CHOL, HDL, LDLCALC, TRIG, CHOLHDL, LDLDIRECT in the last 72 hours. Thyroid Function Tests: No results for input(s): TSH, T4TOTAL, FREET4, T3FREE, THYROIDAB in the last 72 hours. Anemia Panel: No results for input(s): VITAMINB12, FOLATE, FERRITIN, TIBC, IRON, RETICCTPCT in the last 72 hours. Urine analysis:    Component Value Date/Time   COLORURINE LT. YELLOW 12/12/2010 0814   APPEARANCEUR CLEAR 12/12/2010 0814   LABSPEC 1.025 12/12/2010 0814   PHURINE 5.5 12/12/2010 0814   GLUCOSEU NEGATIVE 12/12/2010 0814   HGBUR NEGATIVE 12/12/2010 0814   HGBUR negative 11/30/2008 0906   BILIRUBINUR 1+  06/09/2016 1122   KETONESUR NEGATIVE 12/12/2010 0814   PROTEINUR 1+ 06/09/2016 1122   UROBILINOGEN 1.0 06/09/2016 1122   UROBILINOGEN 0.2 12/12/2010 0814   NITRITE n 06/09/2016 1122   NITRITE NEGATIVE 12/12/2010 0814   LEUKOCYTESUR Negative 06/09/2016 1122   Sepsis Labs: @LABRCNTIP (procalcitonin:4,lacticidven:4) )No results found for this or any previous visit (from the past 240 hour(s)).   Radiological Exams on Admission: Mr Lumbar Spine W Wo Contrast  Result Date: 05/31/2017 CLINICAL DATA:  Initial evaluation for acute severe left back and buttock pain. EXAM: MRI LUMBAR SPINE WITHOUT AND WITH CONTRAST MRI PELVIS WITHOUT AND WITH CONTRAST TECHNIQUE: Multiplanar and multiecho pulse sequences of the lumbar spine were obtained without and with intravenous contrast. CONTRAST:  38mL MULTIHANCE GADOBENATE DIMEGLUMINE 529 MG/ML IV SOLN COMPARISON:  Prior radiograph  from earlier the same day. FINDINGS: MRI LUMBAR SPINE FINDINGS: Segmentation: Normal segmentation. Lowest well-formed disc labeled the L5-S1 level. Alignment: Normal alignment with preservation of the normal lumbar lordosis. No listhesis. Vertebrae: Vertebral body heights well maintained. No evidence for acute or chronic fracture. Bone marrow signal intensity within normal limits. Benign hemangioma noted within the L3 vertebral body. No worrisome osseous lesions. No abnormal marrow edema. Conus medullaris: Extends to the L1-2 level and appears normal. Paraspinal and other soft tissues: Large heterogeneous enhancing mass at the left pelvis, better evaluated on concomitant pelvic MRI. Left psoas muscles displaced anteriorly by the lesion. Paraspinous soft tissues otherwise within normal limits. T2 hyperintense subcentimeter cyst noted within the left kidney. Visualized visceral structures otherwise grossly unremarkable. Disc levels: L1-2:  Unremarkable. L2-3:  Unremarkable. L3-4:  Normal interspace.  Mild bilateral facet hypertrophy. L4-5:  Diffuse disc bulge with disc desiccation. Superimposed small central disc protrusion, slightly eccentric to the left. Mild facet ligamentum flavum hypertrophy. Resultant mild canal with moderate left lateral recess narrowing. Foramina are patent. L5-S1: Diffuse disc bulge with disc desiccation and intervertebral disc space narrowing. Superimposed central disc protrusion with slight inferior migration. Moderate facet arthrosis, greater on the left. Resultant mild canal with moderate left subarticular stenosis. Foramina are patent. MRI PELVIS FINDINGS: There is a large ovoid well-circumscribed mass measuring 9.6 x 6.3 x 11.9 cm positioned within the left hemipelvis. Lesion is isointense to muscle on precontrast T1 sequence, and demonstrates heterogeneous hyperintense T2 signal intensity, with heterogeneous post-contrast enhancement. Lesion lies just medial to the left iliac wing at the level of the left iliacus muscle, and may arise from the iliacus muscle itself. Left psoas muscle is displaced anteriorly and medially, and is draped over the mass as it courses inferiorly. Iliac vessels also displaced medially. No definite invasion of the underlying left iliac wing. No other mass lesion within the pelvis.  No adenopathy. Visualized bowels within normal limits. Bladder within normal limits. Uterus appears to be absent. Ovaries not discretely identified. No free fluid. Bone marrow signal intensity within normal limits. External soft tissues demonstrate no other acute abnormality. IMPRESSION: 1. 9.6 x 6.3 x 11.9 cm heterogeneous mass within the left pelvis at the level of the left iliacus as above. Primary differential considerations consist of possible sarcomatous tumor, potentially arising from the left iliacus muscle. In neurogenic tumor could also be considered. Further evaluation with histologic sampling recommended, as this lesion would likely be amenable to percutaneous sampling. 2. Degenerative disc bulging and  facet arthropathy at L4-5 and L5-S1 with resultant mild canal with moderate left subarticular stenosis. No frank neural impingement within the lumbar spine. Electronically Signed   By: Jeannine Boga M.D.   On: 05/31/2017 21:21    EKG: Not performed.   Assessment/Plan  1. Pelvic mass, intractable pelvic pain  - Pt presents with intractable left hip/buttock pain  - MRI reveals concerning mass in left pelvis, possibly sarcomatous or neurogenic tumor  - Treated with several doses of IV analgesia in ED, but remains in significant pain  - Plan to observe in hospital for pain management and will consult with IR for percutaneous biopsy   2. Hypertension  - BP modestly elevated in ED, likely secondary to pain  - Analgesia as above, hydralazine IVP's prn    3. Chronic low back pain  - Mild canal stenosis, and moderate left subarticular stenosis at L5-S1, no frank neural impingement  - Analgesia as above    DVT prophylaxis: SCD's  Code Status:  Full  Family Communication: Discussed with patient Disposition Plan: Observe on med-surg  Consults called: IR  Admission status: Observation    Vianne Bulls, MD [facility] Pager [phone]  If 7PM-7AM, please contact night-coverage [email or web address] Password [email or web address]  05/31/2017, 11:35 PM

## 2017-05-31 NOTE — Progress Notes (Addendum)
Office Visit Note   Patient: Anita Whitehead           Date of Birth: March 05, 1956           MRN: [id number] Visit Date: 05/31/2017              Requested by: [name], NP [address] [place], [place] [address] PCP: [name], NP   Assessment & Plan: Visit Diagnoses:  1. Radiculopathy, lumbar region   2. Pain of left thigh   3. Claudication (HCC)     Plan:  Reviewed x-rays with patient today. Patient has a fair amount of calcification in her left iliac down to the femoral artery. Has some concerns that arterial occlusive disease is causing or contributing to her left leg symptoms. This is been progressively getting worse over the last several weeks. I did speak with vascular surgeon Dr. Oneida Alar and gave him patient's contact information. States that his office will be in touch with her today to schedule an appointment to come in to their office. Will schedule stat MRI lumbar to rule out HNP/stenosis. Follow-up with [place] after completion of the study to discuss results. Patient reported that she did have a left lower extremity venous Doppler which was negative for DVT. In attempt to give her some relief patient was given Toradol 30 mg and Depo-Medrol 40 mg IM injections in the clinic today.  Prescription for Norco 5/325.  Follow-Up Instructions: Return in about 2 weeks (around 06/14/2017).   Orders:  Orders Placed This Encounter  Procedures  . XR FEMUR MIN 2 VIEWS LEFT  . XR Lumbar Spine 2-3 Views  . MR Lumbar Spine w/o contrast   Meds ordered this encounter  Medications  . HYDROcodone-acetaminophen (NORCO/VICODIN) 5-325 MG tablet    Sig: Take 1 tablet by mouth every 8 (eight) hours as needed for moderate pain.    Dispense:  50 tablet    Refill:  0      Procedures: No procedures performed   Clinical Data: No additional findings.   Subjective: Chief Complaint  Patient presents with  . Left Hip - Pain  . Hip Pain    -- Pain Worsening    HPI  Patient  returns with complaints of left buttock, lateral hip and left leg pain. Patient was seen by [facility] care office 05/23/2017 with complaints of left hip pain and greater trochanteric bursa Marcaine/Depo-Medrol injection was performed. States that she had some improvement of her lateral hip pain but no relief of her thigh and leg pain. Feels like the leg pain is progressively getting worse. No previous problems before onset Labor Day weekend. Denies injury. Intermittent numbness and tingling into the left calf. No symptoms on the right side. Patient and husband report that left foot does get cold at night. Failed conservative treatment with Aleve, ibuprofen and recent injection. Initially she was seen by her primary care nurse practitioner and was told that it was probably related to her cholesterol medication. No previous lumbar spine workup.  Review of Systems No current cardiac pulmonary GI GU issues.  Objective: Vital Signs: BP 133/89   Pulse (!) 101   Physical Exam  Constitutional: She is oriented to person, place, and time. She appears distressed (Patient does appear to be in moderate discomfort with ambulating and getting up on the examining table due to left posterior hip and leg pain.).  HENT:  Head: Normocephalic.  Eyes: Pupils are equal, round, and reactive to light. EOM are normal.  Neck: Normal range of motion.  Pulmonary/Chest: No respiratory distress.  Abdominal: She exhibits no distension.  Musculoskeletal:  Gait is antalgic. Mild left-sided lumbar paraspinal tenderness. Moderate to marked left sciatic notch tenderness. Minimally tender over the left hip greater trochanter bursa. Negative logroll bilateral hips. Pain with left straight leg raise and popliteal compression. Patient's left thigh is tender to palpation. No palpable masses. Bowel calves are nontender. . Question trace left hip flexion weakness. All other strength maneuvers within normal limits.  Neurological: She is  alert and oriented to person, place, and time.    Ortho Exam  Specialty Comments:  No specialty comments available.  Imaging: No results found.   PMFS History: Patient Active Problem List   Diagnosis Date Noted  . Essential hypertension 05/06/2014  . Atrophic vaginitis 04/02/2012  . Routine general medical examination at a health care facility 04/02/2012  . HEARTBURN 02/24/2009  . 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 Hyperlipidemia 05/14/2007  . ALLERGIC RHINITIS 05/14/2007   Past Medical History:  Diagnosis Date  . Allergy   . Cervical cancer ([place]) 1989  . History of colon polyps    TA   . Hyperlipidemia   . Hypertension   . PMS (premenstrual syndrome)     Family History  Problem Relation Age of Onset  . Diabetes Brother   . Hypertension Mother   . Cancer Mother        Unknown  . Hypertension Sister   . Cancer Sister        Breast, uterine  . Colon cancer Neg Hx   . Colon polyps Neg Hx   . Esophageal cancer Neg Hx   . Rectal cancer Neg Hx   . Stomach cancer Neg Hx     Past Surgical History:  Procedure Laterality Date  . ABDOMINAL HYSTERECTOMY    . adenomatous colon polyps    . COLONOSCOPY     04-01-2009,2006 with [facility]   . POLYPECTOMY     Social History   Occupational History  . Not on file.   Social History Main Topics  . Smoking status: Former Research scientist (life sciences)  . Smokeless tobacco: Never Used     Comment: hooka  . Alcohol use 0.0 oz/week     Comment: 3 beers a week and 2 cocktails during the week- 1 drink a day   . Drug use: No  . Sexual activity: Not on file

## 2017-05-31 NOTE — ED Provider Notes (Signed)
I received this patient in signout from Dr. Regenia Skeeter. We were awaiting MRI results. MRI shows large heterogeneous mass in the left pelvis, possible sarcomatous tumor from a left iliac is muscle versus neurogenic tumor. I discussed these findings with the patient and her significant other. She continues to have severe pain in bed even without attempting ambulation, requiring multiple doses of IV narcotics. I have ordered another dose of Dilaudid but am concerned about her ability to function at home given her escalation in pain. Discussed admission for pain management and biopsy with Dr. Myna Hidalgo, appreciate his assistance. Patient admitted for further workup and treatment.   Little, Wenda Overland, MD 05/31/17 [phone]

## 2017-05-31 NOTE — ED Notes (Signed)
Patient transported to MRI 

## 2017-05-31 NOTE — ED Provider Notes (Signed)
[facility]PART[facility] Provider Note   CSN: [id number] Arrival date & time: 05/31/17  1256     History   Chief Complaint Chief Complaint  Patient presents with  . Hip Pain    HPI Anita Whitehead is a 61 y.o. female.  HPI  61 year old female presents with severe left back/buttock pain.  She has been having pain in this area for about 1 month.  She saw [facility] about 1 week ago and was given a cortisone injection in her hip.  Felt better until 3 days ago and has had severe and worsening pain.  The pain radiates from her buttocks down to her knee on both sides of her leg.  It is a sharp pain.  There is no numbness or weakness.  It is hard for her to walk due to the degree of pain.  She has been trying Aleve, Advil, Tylenol with no relief.  Went back to the orthopedic clinic today where she had x-rays that showed some calcifications and so she was going to be referred to vascular for possible claudication.  She was also sent to MRI for a stat MRI to rule out HNP/stenosis.  While an MRI and laying flat her pain became worse and worse and she was unable to tolerate it.  Some images were performed and she was told that it showed "something abnormal".  However she was sent to the ER for better pain control to finish the MRI.  She denies any fevers, incontinence, saddle anesthesia.  Given fentanyl while in the waiting room and feels her pain has improved from a 10 down to a 6 but is now creeping back up.  Past Medical History:  Diagnosis Date  . Allergy   . Cervical cancer ([place]) 1989  . History of colon polyps    TA   . Hyperlipidemia   . Hypertension   . PMS (premenstrual syndrome)     Patient Active Problem List   Diagnosis Date Noted  . Essential hypertension 05/06/2014  . Atrophic vaginitis 04/02/2012  . Routine general medical examination at a health care facility 04/02/2012  . HEARTBURN 02/24/2009  . COLONIC POLYPS, ADENOMATOUS, HX OF  02/24/2009  . Hyperlipidemia 05/14/2007  . ALLERGIC RHINITIS 05/14/2007    Past Surgical History:  Procedure Laterality Date  . ABDOMINAL HYSTERECTOMY    . adenomatous colon polyps    . COLONOSCOPY     04-01-2009,2006 with [facility] [place]   . POLYPECTOMY      OB History    Gravida Para Term Preterm AB Living   2 2           SAB TAB Ectopic Multiple Live Births                   Home Medications    Prior to Admission medications   Medication Sig Start Date End Date Taking? Authorizing Provider  HYDROcodone-acetaminophen (NORCO/VICODIN) 5-325 MG tablet Take 1 tablet by mouth every 8 (eight) hours as needed for moderate pain. 05/31/17  Yes Lanae Crumbly, PA-C  lisinopril (PRINIVIL,ZESTRIL) 20 MG tablet take 1 tablet by mouth once daily 12/05/16  Yes Nafziger, Tommi Rumps, NP    Family History Family History  Problem Relation Age of Onset  . Diabetes Brother   . Hypertension Mother   . Cancer Mother        Unknown  . Hypertension Sister   . Cancer Sister        Breast, uterine  .  Colon cancer Neg Hx   . Colon polyps Neg Hx   . Esophageal cancer Neg Hx   . Rectal cancer Neg Hx   . Stomach cancer Neg Hx     Social History Social History  Substance Use Topics  . Smoking status: Former Research scientist (life sciences)  . Smokeless tobacco: Never Used     Comment: hooka  . Alcohol use 0.0 oz/week     Comment: 3 beers a week and 2 cocktails during the week- 1 drink a day      Allergies   Patient has no known allergies.   Review of Systems Review of Systems  Constitutional: Negative for fever.  Genitourinary:       No bowel/bladder incontinence  Musculoskeletal: Positive for back pain.  Neurological: Negative for weakness and numbness.  All other systems reviewed and are negative.    Physical Exam Updated Vital Signs BP (!) 142/96 (BP Location: Right Arm)   Pulse (!) 107   Temp 97.9 F (36.6 C) (Oral)   Resp 20   SpO2 98%   Physical Exam  Constitutional: She is oriented to person,  place, and time. She appears well-developed and well-nourished. No distress.  HENT:  Head: Normocephalic and atraumatic.  Right Ear: External ear normal.  Left Ear: External ear normal.  Nose: Nose normal.  Eyes: Right eye exhibits no discharge. Left eye exhibits no discharge.  Cardiovascular: Normal rate, regular rhythm and normal heart sounds.   Pulses:      Dorsalis pedis pulses are 2+ on the right side, and 2+ on the left side.  Pulmonary/Chest: Effort normal and breath sounds normal.  Abdominal: Soft. She exhibits no distension. There is no tenderness.  Musculoskeletal:       Left hip: She exhibits normal range of motion and no tenderness.       Lumbar back: She exhibits no tenderness and no bony tenderness.  No significant lumbar or buttock tenderness  Neurological: She is alert and oriented to person, place, and time.  Reflex Scores:      Patellar reflexes are 2+ on the right side and 1+ on the left side.      Achilles reflexes are 2+ on the right side and 2+ on the left side. 5/5 strength in RLE. 4/5 strength LLE, though this seems to be more from severe pain caused by straight leg raise rather than true weakness. Grossly normal sensation in BLE.  Skin: Skin is warm and dry. She is not diaphoretic.  Nursing note and vitals reviewed.    ED Treatments / Results  Labs (all labs ordered are listed, but only abnormal results are displayed) Labs Reviewed - No data to display  EKG  EKG Interpretation None       Radiology No results found.  Procedures Procedures (including critical care time)  Medications Ordered in ED Medications  HYDROmorphone (DILAUDID) injection 1 mg (1 mg Intravenous Given 05/31/17 1459)     Initial Impression / Assessment and Plan / ED Course  I have reviewed the triage vital signs and the nursing notes.  Pertinent labs & imaging results that were available during my care of the patient were reviewed by me and considered in my medical  decision making (see chart for details).     Patient's pain is improving.  Will give better IV pain control and sent back to MRI to help rule out acute emergent conditions.  If there are no emergent conditions, she can be discharged home with outpatient follow-up with orthopedics.  They have already prescribed her hydrocodone for pain.  Care to Dr. Rex Kras with MRI pending.  Final Clinical Impressions(s) / ED Diagnoses   Final diagnoses:  None    New Prescriptions New Prescriptions   No medications on file     Sherwood Gambler, MD 05/31/17 1535

## 2017-05-31 NOTE — ED Triage Notes (Signed)
Pt brought to ED from MRI for pain control. Pt has had hip pain for the past few months, intermittently. Over the past week or so pain has become severe. No injury noted. Pt was dx with bursitis from ortho md. Seen by ortho md this am and had xray. Then told to come to [facility] for MRI. Pt's pain became so bad she couldn't lay in MRI and brought to ED. Per MRI tech- they saw something very abnormal (possible tumor) on the portion of the film they were able to complete. Pt very tearful in pain. Only knows she has something abnormal on MRI

## 2017-05-31 NOTE — Telephone Encounter (Signed)
Pt called Triage phone left vm, I called back pt stated she was here last week saw Benita Stabile, PA had xray showed bursitis, per pt gave injection, pt states she is still hurting, not able to sleep for 24 hrs now, pain is unbearable. Wants to be seen today. Artis Delay or [facility] did not have any openings I offered trying to fit her in for later today, pt is wanting to be seen this am by whom ever I could get her in. Pt is scheduled for this am at 915 with Benjiman Core. PA.

## 2017-06-01 ENCOUNTER — Inpatient Hospital Stay (HOSPITAL_COMMUNITY): Admission: RE | Admit: 2017-06-01 | Payer: 59 | Source: Ambulatory Visit

## 2017-06-01 ENCOUNTER — Ambulatory Visit: Payer: 59 | Admitting: Vascular Surgery

## 2017-06-01 ENCOUNTER — Ambulatory Visit: Payer: 59 | Admitting: Adult Health

## 2017-06-01 ENCOUNTER — Observation Stay (HOSPITAL_COMMUNITY): Payer: 59

## 2017-06-01 DIAGNOSIS — R52 Pain, unspecified: Secondary | ICD-10-CM | POA: Diagnosis not present

## 2017-06-01 DIAGNOSIS — R1900 Intra-abdominal and pelvic swelling, mass and lump, unspecified site: Secondary | ICD-10-CM | POA: Diagnosis not present

## 2017-06-01 LAB — CBC
HEMATOCRIT: 40 % (ref 36.0–46.0)
HEMOGLOBIN: 13.8 g/dL (ref 12.0–15.0)
MCH: 31.2 pg (ref 26.0–34.0)
MCHC: 34.5 g/dL (ref 30.0–36.0)
MCV: 90.5 fL (ref 78.0–100.0)
Platelets: 248 10*3/uL (ref 150–400)
RBC: 4.42 MIL/uL (ref 3.87–5.11)
RDW: 13 % (ref 11.5–15.5)
WBC: 12.2 10*3/uL — AB (ref 4.0–10.5)

## 2017-06-01 LAB — HIV ANTIBODY (ROUTINE TESTING W REFLEX): HIV SCREEN 4TH GENERATION: NONREACTIVE

## 2017-06-01 LAB — BASIC METABOLIC PANEL
ANION GAP: 10 (ref 5–15)
BUN: 10 mg/dL (ref 6–20)
CALCIUM: 9.7 mg/dL (ref 8.9–10.3)
CO2: 23 mmol/L (ref 22–32)
Chloride: 102 mmol/L (ref 101–111)
Creatinine, Ser: 0.69 mg/dL (ref 0.44–1.00)
Glucose, Bld: 102 mg/dL — ABNORMAL HIGH (ref 65–99)
POTASSIUM: 4.1 mmol/L (ref 3.5–5.1)
SODIUM: 135 mmol/L (ref 135–145)

## 2017-06-01 LAB — PROTIME-INR
INR: 1
PROTHROMBIN TIME: 13.1 s (ref 11.4–15.2)

## 2017-06-01 MED ORDER — GABAPENTIN 100 MG PO CAPS
100.0000 mg | ORAL_CAPSULE | Freq: Three times a day (TID) | ORAL | Status: DC
  Administered 2017-06-01 – 2017-06-03 (×7): 100 mg via ORAL
  Filled 2017-06-01 (×7): qty 1

## 2017-06-01 MED ORDER — INFLUENZA VAC SPLIT QUAD 0.5 ML IM SUSY
0.5000 mL | PREFILLED_SYRINGE | INTRAMUSCULAR | Status: AC | PRN
  Administered 2017-06-03: 0.5 mL via INTRAMUSCULAR
  Filled 2017-06-01: qty 0.5

## 2017-06-01 MED ORDER — FENTANYL CITRATE (PF) 100 MCG/2ML IJ SOLN
INTRAMUSCULAR | Status: AC | PRN
  Administered 2017-06-01 (×3): 50 ug via INTRAVENOUS

## 2017-06-01 MED ORDER — MIDAZOLAM HCL 2 MG/2ML IJ SOLN
INTRAMUSCULAR | Status: AC
  Filled 2017-06-01: qty 6

## 2017-06-01 MED ORDER — DIPHENHYDRAMINE HCL 25 MG PO CAPS
25.0000 mg | ORAL_CAPSULE | Freq: Four times a day (QID) | ORAL | Status: DC | PRN
  Administered 2017-06-01: 25 mg via ORAL
  Filled 2017-06-01: qty 1

## 2017-06-01 MED ORDER — MIDAZOLAM HCL 2 MG/2ML IJ SOLN
INTRAMUSCULAR | Status: AC | PRN
  Administered 2017-06-01 (×3): 1 mg via INTRAVENOUS

## 2017-06-01 MED ORDER — LISINOPRIL 5 MG PO TABS
5.0000 mg | ORAL_TABLET | Freq: Every day | ORAL | Status: DC
  Administered 2017-06-01 – 2017-06-03 (×3): 5 mg via ORAL
  Filled 2017-06-01 (×3): qty 1

## 2017-06-01 MED ORDER — ENSURE ENLIVE PO LIQD
237.0000 mL | Freq: Two times a day (BID) | ORAL | Status: DC
  Administered 2017-06-02 – 2017-06-03 (×3): 237 mL via ORAL

## 2017-06-01 MED ORDER — DEXTROSE-NACL 5-0.9 % IV SOLN
INTRAVENOUS | Status: DC
  Administered 2017-06-01: 15:00:00 via INTRAVENOUS

## 2017-06-01 MED ORDER — FENTANYL CITRATE (PF) 100 MCG/2ML IJ SOLN
INTRAMUSCULAR | Status: AC
  Filled 2017-06-01: qty 4

## 2017-06-01 MED ORDER — LIDOCAINE HCL 1 % IJ SOLN
INTRAMUSCULAR | Status: AC
  Filled 2017-06-01: qty 20

## 2017-06-01 NOTE — Sedation Documentation (Signed)
Bandaid L pelvic area

## 2017-06-01 NOTE — ED Notes (Signed)
Attempted report x1. 

## 2017-06-01 NOTE — Consult Note (Signed)
Chief Complaint: Patient was seen in consultation today for pelvic mass  Referring Physician(s): Dr. Myna Hidalgo  Supervising Physician: Arne Cleveland  Patient Status: [name] - In-pt  History of Present Illness: Anita Whitehead is a 61 y.o. female with past medical history of HLD, HTN, and cervical cancer presented to [facility] ED with hip pain.   MR Pelvis showed: 1. Large enhancing mass deep to the left iliacus and lower psoas muscles. Sarcoma is the top differential diagnostic consideration. Presumably an unusual neurofibroma or schwannoma might have a similar appearance, but is considered less likely. I am skeptical that this represents a large abscess related to left sacroiliac septic arthritis given the involvement of the only the upper margin of the SI joint rather than the entire SI joint, but correlation with any fever/leukocytosis is suggested. The mass medially displaces the adjacent iliac vessels and demonstrates likely early invasion of the left upper iliac bone and possible invasion of the left upper SI joint.  IR consulted for pelvic mass biopsy at the request of Dr. Myna Hidalgo.  Patient has been NPO.   Past Medical History:  Diagnosis Date  . Allergy   . Cervical cancer ([place]) 1989  . History of colon polyps    TA   . Hyperlipidemia   . Hypertension   . PMS (premenstrual syndrome)     Past Surgical History:  Procedure Laterality Date  . ABDOMINAL HYSTERECTOMY    . adenomatous colon polyps    . COLONOSCOPY     04-01-2009,2006 with S. Sombrillo   . POLYPECTOMY      Allergies: Patient has no known allergies.  Medications: Prior to Admission medications   Medication Sig Start Date End Date Taking? Authorizing Provider  HYDROcodone-acetaminophen (NORCO/VICODIN) 5-325 MG tablet Take 1 tablet by mouth every 8 (eight) hours as needed for moderate pain. 05/31/17  Yes Lanae Crumbly, PA-C  lisinopril (PRINIVIL,ZESTRIL) 20 MG tablet take 1 tablet by mouth once daily 12/05/16   Yes Nafziger, Tommi Rumps, NP     Family History  Problem Relation Age of Onset  . Diabetes Brother   . Hypertension Mother   . Cancer Mother        Unknown  . Hypertension Sister   . Cancer Sister        Breast, uterine  . Colon cancer Neg Hx   . Colon polyps Neg Hx   . Esophageal cancer Neg Hx   . Rectal cancer Neg Hx   . Stomach cancer Neg Hx     Social History   Social History  . Marital status: Married    Spouse name: N/A  . Number of children: N/A  . Years of education: N/A   Social History Main Topics  . Smoking status: Former Research scientist (life sciences)  . Smokeless tobacco: Never Used     Comment: hooka  . Alcohol use 0.0 oz/week     Comment: 3 beers a week and 2 cocktails during the week- 1 drink a day   . Drug use: No  . Sexual activity: Not Asked   Other Topics Concern  . None   Social History Narrative   Civil engineer, contracting for [employer]   Married for 16 years   2 boys, one in [place] and one in [place]    Review of Systems  Constitutional: Positive for fatigue. Negative for fever.  Respiratory: Negative for cough and shortness of breath.   Cardiovascular: Negative for chest pain.  Gastrointestinal: Negative for abdominal pain.  Genitourinary: Positive  for pelvic pain (hip pain).  Psychiatric/Behavioral: Negative for behavioral problems and confusion.    Vital Signs: BP (!) 148/92 (BP Location: Left Arm)   Pulse 85   Temp 97.9 F (36.6 C) (Oral)   Resp 17   Ht 5\' 2"  (1.575 m)   Wt 110 lb (49.9 kg)   SpO2 100%   BMI 20.12 kg/m   Physical Exam  Constitutional: She is oriented to person, place, and time. She appears well-developed.  Cardiovascular: Normal rate, regular rhythm and normal heart sounds.   Pulmonary/Chest: Effort normal and breath sounds normal. No respiratory distress.  Abdominal: Soft. She exhibits no mass.  Neurological: She is alert and oriented to person, place, and time.  Skin: Skin is warm and dry.  Psychiatric: She has a  normal mood and affect. Her behavior is normal. Judgment and thought content normal.  Nursing note and vitals reviewed.   Imaging: Mr Lumbar Spine W Wo Contrast  Result Date: 05/31/2017 CLINICAL DATA:  Initial evaluation for acute severe left back and buttock pain. EXAM: MRI LUMBAR SPINE WITHOUT AND WITH CONTRAST MRI PELVIS WITHOUT AND WITH CONTRAST TECHNIQUE: Multiplanar and multiecho pulse sequences of the lumbar spine were obtained without and with intravenous contrast. CONTRAST:  26mL MULTIHANCE GADOBENATE DIMEGLUMINE 529 MG/ML IV SOLN COMPARISON:  Prior radiograph from earlier the same day. FINDINGS: MRI LUMBAR SPINE FINDINGS: Segmentation: Normal segmentation. Lowest well-formed disc labeled the L5-S1 level. Alignment: Normal alignment with preservation of the normal lumbar lordosis. No listhesis. Vertebrae: Vertebral body heights well maintained. No evidence for acute or chronic fracture. Bone marrow signal intensity within normal limits. Benign hemangioma noted within the L3 vertebral body. No worrisome osseous lesions. No abnormal marrow edema. Conus medullaris: Extends to the L1-2 level and appears normal. Paraspinal and other soft tissues: Large heterogeneous enhancing mass at the left pelvis, better evaluated on concomitant pelvic MRI. Left psoas muscles displaced anteriorly by the lesion. Paraspinous soft tissues otherwise within normal limits. T2 hyperintense subcentimeter cyst noted within the left kidney. Visualized visceral structures otherwise grossly unremarkable. Disc levels: L1-2:  Unremarkable. L2-3:  Unremarkable. L3-4:  Normal interspace.  Mild bilateral facet hypertrophy. L4-5: Diffuse disc bulge with disc desiccation. Superimposed small central disc protrusion, slightly eccentric to the left. Mild facet ligamentum flavum hypertrophy. Resultant mild canal with moderate left lateral recess narrowing. Foramina are patent. L5-S1: Diffuse disc bulge with disc desiccation and  intervertebral disc space narrowing. Superimposed central disc protrusion with slight inferior migration. Moderate facet arthrosis, greater on the left. Resultant mild canal with moderate left subarticular stenosis. Foramina are patent. MRI PELVIS FINDINGS: There is a large ovoid well-circumscribed mass measuring 9.6 x 6.3 x 11.9 cm positioned within the left hemipelvis. Lesion is isointense to muscle on precontrast T1 sequence, and demonstrates heterogeneous hyperintense T2 signal intensity, with heterogeneous post-contrast enhancement. Lesion lies just medial to the left iliac wing at the level of the left iliacus muscle, and may arise from the iliacus muscle itself. Left psoas muscle is displaced anteriorly and medially, and is draped over the mass as it courses inferiorly. Iliac vessels also displaced medially. No definite invasion of the underlying left iliac wing. No other mass lesion within the pelvis.  No adenopathy. Visualized bowels within normal limits. Bladder within normal limits. Uterus appears to be absent. Ovaries not discretely identified. No free fluid. Bone marrow signal intensity within normal limits. External soft tissues demonstrate no other acute abnormality. IMPRESSION: 1. 9.6 x 6.3 x 11.9 cm heterogeneous mass within the left  pelvis at the level of the left iliacus as above. Primary differential considerations consist of possible sarcomatous tumor, potentially arising from the left iliacus muscle. In neurogenic tumor could also be considered. Further evaluation with histologic sampling recommended, as this lesion would likely be amenable to percutaneous sampling. 2. Degenerative disc bulging and facet arthropathy at L4-5 and L5-S1 with resultant mild canal with moderate left subarticular stenosis. No frank neural impingement within the lumbar spine. Electronically Signed   By: Jeannine Boga M.D.   On: 05/31/2017 21:21   Mr Pelvis W Wo Contrast  Result Date: 06/01/2017 CLINICAL  DATA:  Left back and buttock pain. EXAM: MRI PELVIS WITHOUT AND WITH CONTRAST TECHNIQUE: Multiplanar multisequence MR imaging of the pelvis was performed both before and after administration of intravenous contrast. CONTRAST:  41mL MULTIHANCE GADOBENATE DIMEGLUMINE 529 MG/ML IV SOLN COMPARISON:  MRI lumbar spine 05/26/2017; radiographs 05/23/2017 FINDINGS: Urinary Tract:  Unremarkable Bowel:  Unremarkable Vascular/Lymphatic: The left-sided mass abuts and displaces the left internal and external iliac vessels medially. Reproductive: Uterus absent. No specific adnexal abnormality is identified. Other:  No supplemental non-categorized findings. Musculoskeletal: An 11.4 by 6.3 by 8.9 cm mass is present deep to the iliacus and lower psoas muscle on the left. Suspected early invasion of the left upper iliac bone with low-grade edema and enhancement in the marrow. Possible invasion of the upper left SI joint. The mass exerts mass effect on the left L4 and L5 spinal nerves and extends down along the iloipsoas anterior to the left new acetabulum. The mass has intermediate precontrast T1 signal and heterogeneous but primarily high a T2 signal, and demonstrates definite enhancement new especially along its margins new, likely with some central necrosis. IMPRESSION: 1. Large enhancing mass deep to the left iliacus and lower psoas muscles. Sarcoma is the top differential diagnostic consideration. Presumably an unusual neurofibroma or schwannoma might have a similar appearance, but is considered less likely. I am skeptical that this represents a large abscess related to left sacroiliac septic arthritis given the involvement of the only the upper margin of the SI joint rather than the entire SI joint, but correlation with any fever/leukocytosis is suggested. The mass medially displaces the adjacent iliac vessels and demonstrates likely early invasion of the left upper iliac bone and possible invasion of the left upper SI joint.  Electronically Signed   By: Van Clines M.D.   On: 06/01/2017 08:18   Xr Hip Unilat W Or W/o Pelvis 1v Left  Result Date: 05/23/2017 AP pelvis and lateral view left hip: No acute fracture. Left hips well located. Left hip joints well preserved.   Labs:  CBC:  Recent Labs  06/09/16 0805 05/31/17 1832 05/31/17 2316 06/01/17 0114  WBC 7.5  --  13.6* 12.2*  HGB 14.8 14.6 13.9 13.8  HCT 44.1 43.0 40.5 40.0  PLT 254.0  --  245 248    COAGS:  Recent Labs  06/01/17 0902  INR 1.00    BMP:  Recent Labs  06/09/16 0805 05/31/17 1832 05/31/17 2316 06/01/17 0114  NA 142 138 135 135  K 4.2 4.4 4.3 4.1  CL 107 102 102 102  CO2 27  --  22 23  GLUCOSE 89 107* 100* 102*  BUN 14 12 10 10   CALCIUM 10.0  --  9.6 9.7  CREATININE 0.77 0.60 0.73 0.69  GFRNONAA  --   --  >60 >60  GFRAA  --   --  >60 >60    LIVER FUNCTION TESTS:  Recent Labs  06/09/16 0805 05/31/17 2316  BILITOT 0.9 1.3*  AST 20 18  ALT 19 19  ALKPHOS 68 66  PROT 7.5 7.1  ALBUMIN 4.6 4.3    TUMOR MARKERS: No results for input(s): AFPTM, CEA, CA199, CHROMGRNA in the last 8760 hours.  Assessment and Plan: Pelvis mass Patient with past medical history of cervical cancer, HTN presents with complaint of hip pain.  She is found to have a large left-sided pelvis mass. IR consulted for pelvic mass biopsy at the request of Dr. Myna Hidalgo. Case reviewed by Dr. Vernard Gambles who approves patient for procedure.  Patient presents today in their usual state of health.  She has been NPO and is not currently on blood thinners.  Risks and benefits discussed with the patient including, but not limited to bleeding, infection, damage to adjacent structures or low yield requiring additional tests. All of the patient's questions were answered, patient is agreeable to proceed. Consent signed and in chart.   Thank you for this interesting consult.  I greatly enjoyed meeting ELMYRA BANWART and look forward to participating  in their care.  A copy of this report was sent to the requesting provider on this date.  Electronically Signed: Docia Barrier, PA 06/01/2017, 10:43 AM   I spent a total of 40 Minutes    in face to face in clinical consultation, greater than 50% of which was counseling/coordinating care for pelvic mass

## 2017-06-01 NOTE — Procedures (Signed)
  Procedure: CT core biopsy L pelvic mass 18g x4 Preprocedure diagnosis: Pelvic mass Postprocedure diagnosis: same EBL:   minimal Complications:  none immediate  See full dictation in [facility].  Dillard Cannon MD Main # [phone] Pager  [phone]

## 2017-06-01 NOTE — Progress Notes (Signed)
Initial Nutrition Assessment  DOCUMENTATION CODES:   Not applicable  INTERVENTION:   -Once diet advances, add: Ensure Enlive po BID, each supplement provides 350 kcal and 20 grams of protein  NUTRITION DIAGNOSIS:   Inadequate oral intake related to decreased appetite (pain) as evidenced by percent weight loss, per patient/family report.  GOAL:   Patient will meet greater than or equal to 90% of their needs   MONITOR:   PO intake, Supplement acceptance, Diet advancement, Labs, Weight trends, Skin, I & O's  REASON FOR ASSESSMENT:   Malnutrition Screening Tool    ASSESSMENT:   Anita Whitehead is a 61 y.o. female with medical history significant for hypertension and chronic low back pain with radiculopathy, now presenting to the emergency department for evaluation of intractable pain in the left hip/buttock.  She first noticed this at the beginning of September, but it was only with walking at that point and has gradually worsened.  Patient has been under the care of an outpatient physician, was noted to have a left trochanteric bursitis 2 weeks ago, treated with a steroid injection, but she followed up again today due to continued and worsening pain.  She was sent for outpatient MRI, but was unable to complete it due to intractable pain.  A gross abnormality was noted within the pelvis on the incomplete MRI.  She was then referred to the emergency department for pain control and completion of the MRI.  Pt NPO for pelvic mass biopsy. Per RN, pt will likely discharge tomorrow due to procedure today.  Spoke with pt at bedside, who reports she generally does not eat much, however, consumes 3 meals per day (Breakfast: cereal, lunch: salad, dinner: burger and fries). Pt reports that over the past month she has been skipping dinner because she has been feeling pain and fatigued when she comes home. Pt reports that she is anxious to eat now, but is understanding of NPO order.  Reviewed wt  hx; pt has experienced a 14.7% wt loss within the past 4 months, which is significant for time frame. Pt shares UBW is around 128-135# and estimates she has lost about 10# within the past month, secondary to poor oral intake related to pain.   Discussed with pt importance of good meal intake to promote healing.  Labs reviewed.   NUTRITION - FOCUSED PHYSICAL EXAM:    Most Recent Value  Orbital Region  No depletion  Upper Arm Region  No depletion  Thoracic and Lumbar Region  No depletion  Buccal Region  No depletion  Temple Region  No depletion  Clavicle Bone Region  Mild depletion  Clavicle and Acromion Bone Region  No depletion  Scapular Bone Region  No depletion  Dorsal Hand  No depletion  Patellar Region  No depletion  Anterior Thigh Region  No depletion  Posterior Calf Region  No depletion  Edema (RD Assessment)  None  Hair  Reviewed  Eyes  Reviewed  Mouth  Reviewed  Skin  Reviewed  Nails  Reviewed       Diet Order:  Diet NPO time specified Except for: Ice Chips, Sips with Meds Diet regular Room service appropriate? Yes; Fluid consistency: Thin  EDUCATION NEEDS:   Education needs have been addressed  Skin:  Skin Assessment: Reviewed RN Assessment  Last BM:  05/31/17  Height:   Ht Readings from Last 1 Encounters:  06/01/17 5\' 2"  (1.575 m)    Weight:   Wt Readings from Last 1 Encounters:  06/01/17  110 lb (49.9 kg)    Ideal Body Weight:  49.9 kg  BMI:  Body mass index is 20.12 kg/m.  Estimated Nutritional Needs:   Kcal:  1550-1750  Protein:  75-90 grams  Fluid:  > 1.5 L    Hawa Henly A. Jimmye Norman, RD, LDN, CDE Pager: [phone] After hours Pager: [phone]

## 2017-06-01 NOTE — Progress Notes (Signed)
Pt arrived to unit from ED. Pt A&O x4. Oriented to room and call bell.Complained of left hip pain, PRN Dilaudid 1mg  given.

## 2017-06-01 NOTE — Progress Notes (Signed)
PROGRESS NOTE    KORIE STREAT  DPO:[id number] DOB: April 28, 1956 DOA: 05/31/2017 PCP: [name], NP    Brief Narrative:  61 y.o. female with medical history significant for hypertension and chronic low back pain with radiculopathy, now presenting to the emergency department for evaluation of intractable pain in the left hip/buttock.  She first noticed this at the beginning of September, but it was only with walking at that point and has gradually worsened.  Patient has been under the care of an outpatient physician, was noted to have a left trochanteric bursitis 2 weeks ago, treated with a steroid injection, but she followed up again today due to continued and worsening pain.  She was sent for outpatient MRI, but was unable to complete it due to intractable pain.  A gross abnormality was noted within the pelvis on the incomplete MRI   Assessment & Plan:   Principal Problem:   Intractable pain - will add gabapentin - most likely due to mass - IR to biopsy  Active Problems:   Essential hypertension   Pelvic mass in female - please see discussion above.  DVT prophylaxis: SCD's Code Status: Full Family Communication: None at bedside. Disposition Plan: pending improvement in condition   Consultants:   Radiology   Procedures: biopsy of pelvic mass   Antimicrobials: None   Subjective: Pt has no new complaints. Still complaining of same pain from admission  Objective: Vitals:   06/01/17 1631 06/01/17 1638 06/01/17 1658 06/01/17 1718  BP: (!) 154/97 (!) 149/86 (!) 154/87 138/78  Pulse: (!) 110 100 (!) 102 84  Resp: 16 18  15   Temp:    98.1 F (36.7 C)  TempSrc:    Oral  SpO2: 98% 97% 95% 99%  Weight:      Height:        Intake/Output Summary (Last 24 hours) at 06/01/17 1740 Last data filed at 06/01/17 1544  Gross per 24 hour  Intake           383.33 ml  Output                0 ml  Net           383.33 ml   Filed Weights   06/01/17 0129  Weight: 49.9 kg  (110 lb)    Examination:  General exam: Appears calm and comfortable, in nad. Respiratory system: Clear to auscultation. Respiratory effort normal. Cardiovascular system: S1 & S2 heard, RRR. No JVD, murmurs, rubs, gallops or clicks. Gastrointestinal system: Abdomen is nondistended, no guarding Central nervous system: Alert and oriented. No focal neurological deficits. Extremities: Symmetric 5 x 5 power. Skin: No rashes, lesions or ulcers, on limited exam. Psychiatry: Judgement and insight appear normal. Mood & affect appropriate.     Data Reviewed: I have personally reviewed following labs and imaging studies  CBC:  Recent Labs Lab 05/31/17 1832 05/31/17 2316 06/01/17 0114  WBC  --  13.6* 12.2*  NEUTROABS  --  10.4*  --   HGB 14.6 13.9 13.8  HCT 43.0 40.5 40.0  MCV  --  90.4 90.5  PLT  --  245 431   Basic Metabolic Panel:  Recent Labs Lab 05/31/17 1832 05/31/17 2316 06/01/17 0114  NA 138 135 135  K 4.4 4.3 4.1  CL 102 102 102  CO2  --  22 23  GLUCOSE 107* 100* 102*  BUN 12 10 10   CREATININE 0.60 0.73 0.69  CALCIUM  --  9.6 9.7  GFR: Estimated Creatinine Clearance: 60.4 mL/min (by C-G formula based on SCr of 0.69 mg/dL). Liver Function Tests:  Recent Labs Lab 05/31/17 2316  AST 18  ALT 19  ALKPHOS 66  BILITOT 1.3*  PROT 7.1  ALBUMIN 4.3   No results for input(s): LIPASE, AMYLASE in the last 168 hours. No results for input(s): AMMONIA in the last 168 hours. Coagulation Profile:  Recent Labs Lab 06/01/17 0902  INR 1.00   Cardiac Enzymes: No results for input(s): CKTOTAL, CKMB, CKMBINDEX, TROPONINI in the last 168 hours. BNP (last 3 results) No results for input(s): PROBNP in the last 8760 hours. HbA1C: No results for input(s): HGBA1C in the last 72 hours. CBG: No results for input(s): GLUCAP in the last 168 hours. Lipid Profile: No results for input(s): CHOL, HDL, LDLCALC, TRIG, CHOLHDL, LDLDIRECT in the last 72 hours. Thyroid Function  Tests: No results for input(s): TSH, T4TOTAL, FREET4, T3FREE, THYROIDAB in the last 72 hours. Anemia Panel: No results for input(s): VITAMINB12, FOLATE, FERRITIN, TIBC, IRON, RETICCTPCT in the last 72 hours. Sepsis Labs: No results for input(s): PROCALCITON, LATICACIDVEN in the last 168 hours.  No results found for this or any previous visit (from the past 240 hour(s)).       Radiology Studies: Mr Lumbar Spine W Wo Contrast  Result Date: 05/31/2017 CLINICAL DATA:  Initial evaluation for acute severe left back and buttock pain. EXAM: MRI LUMBAR SPINE WITHOUT AND WITH CONTRAST MRI PELVIS WITHOUT AND WITH CONTRAST TECHNIQUE: Multiplanar and multiecho pulse sequences of the lumbar spine were obtained without and with intravenous contrast. CONTRAST:  3mL MULTIHANCE GADOBENATE DIMEGLUMINE 529 MG/ML IV SOLN COMPARISON:  Prior radiograph from earlier the same day. FINDINGS: MRI LUMBAR SPINE FINDINGS: Segmentation: Normal segmentation. Lowest well-formed disc labeled the L5-S1 level. Alignment: Normal alignment with preservation of the normal lumbar lordosis. No listhesis. Vertebrae: Vertebral body heights well maintained. No evidence for acute or chronic fracture. Bone marrow signal intensity within normal limits. Benign hemangioma noted within the L3 vertebral body. No worrisome osseous lesions. No abnormal marrow edema. Conus medullaris: Extends to the L1-2 level and appears normal. Paraspinal and other soft tissues: Large heterogeneous enhancing mass at the left pelvis, better evaluated on concomitant pelvic MRI. Left psoas muscles displaced anteriorly by the lesion. Paraspinous soft tissues otherwise within normal limits. T2 hyperintense subcentimeter cyst noted within the left kidney. Visualized visceral structures otherwise grossly unremarkable. Disc levels: L1-2:  Unremarkable. L2-3:  Unremarkable. L3-4:  Normal interspace.  Mild bilateral facet hypertrophy. L4-5: Diffuse disc bulge with disc  desiccation. Superimposed small central disc protrusion, slightly eccentric to the left. Mild facet ligamentum flavum hypertrophy. Resultant mild canal with moderate left lateral recess narrowing. Foramina are patent. L5-S1: Diffuse disc bulge with disc desiccation and intervertebral disc space narrowing. Superimposed central disc protrusion with slight inferior migration. Moderate facet arthrosis, greater on the left. Resultant mild canal with moderate left subarticular stenosis. Foramina are patent. MRI PELVIS FINDINGS: There is a large ovoid well-circumscribed mass measuring 9.6 x 6.3 x 11.9 cm positioned within the left hemipelvis. Lesion is isointense to muscle on precontrast T1 sequence, and demonstrates heterogeneous hyperintense T2 signal intensity, with heterogeneous post-contrast enhancement. Lesion lies just medial to the left iliac wing at the level of the left iliacus muscle, and may arise from the iliacus muscle itself. Left psoas muscle is displaced anteriorly and medially, and is draped over the mass as it courses inferiorly. Iliac vessels also displaced medially. No definite invasion of the underlying left iliac  wing. No other mass lesion within the pelvis.  No adenopathy. Visualized bowels within normal limits. Bladder within normal limits. Uterus appears to be absent. Ovaries not discretely identified. No free fluid. Bone marrow signal intensity within normal limits. External soft tissues demonstrate no other acute abnormality. IMPRESSION: 1. 9.6 x 6.3 x 11.9 cm heterogeneous mass within the left pelvis at the level of the left iliacus as above. Primary differential considerations consist of possible sarcomatous tumor, potentially arising from the left iliacus muscle. In neurogenic tumor could also be considered. Further evaluation with histologic sampling recommended, as this lesion would likely be amenable to percutaneous sampling. 2. Degenerative disc bulging and facet arthropathy at L4-5 and  L5-S1 with resultant mild canal with moderate left subarticular stenosis. No frank neural impingement within the lumbar spine. Electronically Signed   By: Jeannine Boga M.D.   On: 05/31/2017 21:21   Mr Pelvis W Wo Contrast  Result Date: 06/01/2017 CLINICAL DATA:  Left back and buttock pain. EXAM: MRI PELVIS WITHOUT AND WITH CONTRAST TECHNIQUE: Multiplanar multisequence MR imaging of the pelvis was performed both before and after administration of intravenous contrast. CONTRAST:  71mL MULTIHANCE GADOBENATE DIMEGLUMINE 529 MG/ML IV SOLN COMPARISON:  MRI lumbar spine 05/26/2017; radiographs 05/23/2017 FINDINGS: Urinary Tract:  Unremarkable Bowel:  Unremarkable Vascular/Lymphatic: The left-sided mass abuts and displaces the left internal and external iliac vessels medially. Reproductive: Uterus absent. No specific adnexal abnormality is identified. Other:  No supplemental non-categorized findings. Musculoskeletal: An 11.4 by 6.3 by 8.9 cm mass is present deep to the iliacus and lower psoas muscle on the left. Suspected early invasion of the left upper iliac bone with low-grade edema and enhancement in the marrow. Possible invasion of the upper left SI joint. The mass exerts mass effect on the left L4 and L5 spinal nerves and extends down along the iloipsoas anterior to the left new acetabulum. The mass has intermediate precontrast T1 signal and heterogeneous but primarily high a T2 signal, and demonstrates definite enhancement new especially along its margins new, likely with some central necrosis. IMPRESSION: 1. Large enhancing mass deep to the left iliacus and lower psoas muscles. Sarcoma is the top differential diagnostic consideration. Presumably an unusual neurofibroma or schwannoma might have a similar appearance, but is considered less likely. I am skeptical that this represents a large abscess related to left sacroiliac septic arthritis given the involvement of the only the upper margin of the SI  joint rather than the entire SI joint, but correlation with any fever/leukocytosis is suggested. The mass medially displaces the adjacent iliac vessels and demonstrates likely early invasion of the left upper iliac bone and possible invasion of the left upper SI joint. Electronically Signed   By: Van Clines M.D.   On: 06/01/2017 08:18    Scheduled Meds: . [START ON 06/02/2017] feeding supplement (ENSURE ENLIVE)  237 mL Oral BID BM  . fentaNYL      . gabapentin  100 mg Oral TID  . lidocaine      . lisinopril  5 mg Oral Daily  . midazolam       Continuous Infusions: . dextrose 5 % and 0.9% NaCl 100 mL/hr at 06/01/17 1515     LOS: 0 days    Time spent: > 35 minutes  Velvet Bathe, MD [facility] Pager [phone]  If 7PM-7AM, please contact night-coverage [email or web address] Password [email or web address] 06/01/2017, 5:40 PM

## 2017-06-01 NOTE — ED Notes (Signed)
Admitting at bedside 

## 2017-06-01 NOTE — Progress Notes (Signed)
1700 Received pt back from IR, A&O x4. Left pelvic area biopsy site with band aid dry and intact. Anita Whitehead denies pain. Kept pt on bedrest for 3 hrs as ordered.

## 2017-06-02 DIAGNOSIS — R52 Pain, unspecified: Secondary | ICD-10-CM | POA: Diagnosis not present

## 2017-06-02 DIAGNOSIS — R1900 Intra-abdominal and pelvic swelling, mass and lump, unspecified site: Secondary | ICD-10-CM | POA: Diagnosis not present

## 2017-06-02 MED ORDER — OXYCODONE HCL ER 10 MG PO T12A
20.0000 mg | EXTENDED_RELEASE_TABLET | Freq: Two times a day (BID) | ORAL | Status: DC
  Administered 2017-06-02 – 2017-06-03 (×2): 20 mg via ORAL
  Filled 2017-06-02 (×2): qty 2

## 2017-06-02 NOTE — Progress Notes (Signed)
PROGRESS NOTE    Anita Whitehead  FTD:[id number] DOB: 14-May-1956 DOA: 05/31/2017 PCP: [name], NP    Brief Narrative:  61 y.o. female with medical history significant for hypertension and chronic low back pain with radiculopathy, now presenting to the emergency department for evaluation of intractable pain in the left hip/buttock.  She first noticed this at the beginning of September, but it was only with walking at that point and has gradually worsened.  Patient has been under the care of an outpatient physician, was noted to have a left trochanteric bursitis 2 weeks ago, treated with a steroid injection, but she followed up again today due to continued and worsening pain.  She was sent for outpatient MRI, but was unable to complete it due to intractable pain.  A gross abnormality was noted within the pelvis on the incomplete MRI   Assessment & Plan:   Principal Problem:   Intractable pain - added gabapentin. Will d/c diluadid and place on long acting opiod regimen with short acting oral pain medication regimen. - most likely due to mass - s/p IR biopsy  Active Problems:   Essential hypertension   Pelvic mass in female - please see discussion above.  DVT prophylaxis: SCD's Code Status: Full Family Communication: None at bedside. Disposition Plan: pending improvement in condition   Consultants:   Radiology   Procedures: biopsy of pelvic mass   Antimicrobials: None   Subjective: No acute issues reported overnight.  Objective: Vitals:   06/01/17 1820 06/01/17 2154 06/02/17 0455 06/02/17 1325  BP: 136/79 138/76 125/76 125/67  Pulse: 81 (!) 101 94 84  Resp: 15 18 18 16   Temp: 98.8 F (37.1 C) 98.5 F (36.9 C) 98.4 F (36.9 C) 98.5 F (36.9 C)  TempSrc: Oral Oral Oral Oral  SpO2: 100% 100% 100% 100%  Weight:      Height:        Intake/Output Summary (Last 24 hours) at 06/02/17 1551 Last data filed at 06/02/17 1327  Gross per 24 hour  Intake               600 ml  Output                0 ml  Net              600 ml   Filed Weights   06/01/17 0129  Weight: 49.9 kg (110 lb)    Examination: exam unchanged when compared to 06/01/17  General exam: Appears calm and comfortable, in nad. Respiratory system: Clear to auscultation. Respiratory effort normal. Cardiovascular system: S1 & S2 heard, RRR. No JVD, murmurs, rubs, gallops or clicks. Gastrointestinal system: Abdomen is nondistended, no guarding Central nervous system: Alert and oriented. No focal neurological deficits. Extremities: Symmetric 5 x 5 power. Skin: No rashes, lesions or ulcers, on limited exam. Psychiatry: Judgement and insight appear normal. Mood & affect appropriate.     Data Reviewed: I have personally reviewed following labs and imaging studies  CBC:  Recent Labs Lab 05/31/17 1832 05/31/17 2316 06/01/17 0114  WBC  --  13.6* 12.2*  NEUTROABS  --  10.4*  --   HGB 14.6 13.9 13.8  HCT 43.0 40.5 40.0  MCV  --  90.4 90.5  PLT  --  245 062   Basic Metabolic Panel:  Recent Labs Lab 05/31/17 1832 05/31/17 2316 06/01/17 0114  NA 138 135 135  K 4.4 4.3 4.1  CL 102 102 102  CO2  --  22 23  GLUCOSE 107* 100* 102*  BUN 12 10 10   CREATININE 0.60 0.73 0.69  CALCIUM  --  9.6 9.7   GFR: Estimated Creatinine Clearance: 60.4 mL/min (by C-G formula based on SCr of 0.69 mg/dL). Liver Function Tests:  Recent Labs Lab 05/31/17 2316  AST 18  ALT 19  ALKPHOS 66  BILITOT 1.3*  PROT 7.1  ALBUMIN 4.3   No results for input(s): LIPASE, AMYLASE in the last 168 hours. No results for input(s): AMMONIA in the last 168 hours. Coagulation Profile:  Recent Labs Lab 06/01/17 0902  INR 1.00   Cardiac Enzymes: No results for input(s): CKTOTAL, CKMB, CKMBINDEX, TROPONINI in the last 168 hours. BNP (last 3 results) No results for input(s): PROBNP in the last 8760 hours. HbA1C: No results for input(s): HGBA1C in the last 72 hours. CBG: No results for input(s):  GLUCAP in the last 168 hours. Lipid Profile: No results for input(s): CHOL, HDL, LDLCALC, TRIG, CHOLHDL, LDLDIRECT in the last 72 hours. Thyroid Function Tests: No results for input(s): TSH, T4TOTAL, FREET4, T3FREE, THYROIDAB in the last 72 hours. Anemia Panel: No results for input(s): VITAMINB12, FOLATE, FERRITIN, TIBC, IRON, RETICCTPCT in the last 72 hours. Sepsis Labs: No results for input(s): PROCALCITON, LATICACIDVEN in the last 168 hours.  No results found for this or any previous visit (from the past 240 hour(s)).       Radiology Studies: Mr Lumbar Spine W Wo Contrast  Result Date: 05/31/2017 CLINICAL DATA:  Initial evaluation for acute severe left back and buttock pain. EXAM: MRI LUMBAR SPINE WITHOUT AND WITH CONTRAST MRI PELVIS WITHOUT AND WITH CONTRAST TECHNIQUE: Multiplanar and multiecho pulse sequences of the lumbar spine were obtained without and with intravenous contrast. CONTRAST:  33mL MULTIHANCE GADOBENATE DIMEGLUMINE 529 MG/ML IV SOLN COMPARISON:  Prior radiograph from earlier the same day. FINDINGS: MRI LUMBAR SPINE FINDINGS: Segmentation: Normal segmentation. Lowest well-formed disc labeled the L5-S1 level. Alignment: Normal alignment with preservation of the normal lumbar lordosis. No listhesis. Vertebrae: Vertebral body heights well maintained. No evidence for acute or chronic fracture. Bone marrow signal intensity within normal limits. Benign hemangioma noted within the L3 vertebral body. No worrisome osseous lesions. No abnormal marrow edema. Conus medullaris: Extends to the L1-2 level and appears normal. Paraspinal and other soft tissues: Large heterogeneous enhancing mass at the left pelvis, better evaluated on concomitant pelvic MRI. Left psoas muscles displaced anteriorly by the lesion. Paraspinous soft tissues otherwise within normal limits. T2 hyperintense subcentimeter cyst noted within the left kidney. Visualized visceral structures otherwise grossly unremarkable.  Disc levels: L1-2:  Unremarkable. L2-3:  Unremarkable. L3-4:  Normal interspace.  Mild bilateral facet hypertrophy. L4-5: Diffuse disc bulge with disc desiccation. Superimposed small central disc protrusion, slightly eccentric to the left. Mild facet ligamentum flavum hypertrophy. Resultant mild canal with moderate left lateral recess narrowing. Foramina are patent. L5-S1: Diffuse disc bulge with disc desiccation and intervertebral disc space narrowing. Superimposed central disc protrusion with slight inferior migration. Moderate facet arthrosis, greater on the left. Resultant mild canal with moderate left subarticular stenosis. Foramina are patent. MRI PELVIS FINDINGS: There is a large ovoid well-circumscribed mass measuring 9.6 x 6.3 x 11.9 cm positioned within the left hemipelvis. Lesion is isointense to muscle on precontrast T1 sequence, and demonstrates heterogeneous hyperintense T2 signal intensity, with heterogeneous post-contrast enhancement. Lesion lies just medial to the left iliac wing at the level of the left iliacus muscle, and may arise from the iliacus muscle itself. Left psoas muscle is displaced  anteriorly and medially, and is draped over the mass as it courses inferiorly. Iliac vessels also displaced medially. No definite invasion of the underlying left iliac wing. No other mass lesion within the pelvis.  No adenopathy. Visualized bowels within normal limits. Bladder within normal limits. Uterus appears to be absent. Ovaries not discretely identified. No free fluid. Bone marrow signal intensity within normal limits. External soft tissues demonstrate no other acute abnormality. IMPRESSION: 1. 9.6 x 6.3 x 11.9 cm heterogeneous mass within the left pelvis at the level of the left iliacus as above. Primary differential considerations consist of possible sarcomatous tumor, potentially arising from the left iliacus muscle. In neurogenic tumor could also be considered. Further evaluation with histologic  sampling recommended, as this lesion would likely be amenable to percutaneous sampling. 2. Degenerative disc bulging and facet arthropathy at L4-5 and L5-S1 with resultant mild canal with moderate left subarticular stenosis. No frank neural impingement within the lumbar spine. Electronically Signed   By: Jeannine Boga M.D.   On: 05/31/2017 21:21   Mr Pelvis W Wo Contrast  Result Date: 06/01/2017 CLINICAL DATA:  Left back and buttock pain. EXAM: MRI PELVIS WITHOUT AND WITH CONTRAST TECHNIQUE: Multiplanar multisequence MR imaging of the pelvis was performed both before and after administration of intravenous contrast. CONTRAST:  56mL MULTIHANCE GADOBENATE DIMEGLUMINE 529 MG/ML IV SOLN COMPARISON:  MRI lumbar spine 05/26/2017; radiographs 05/23/2017 FINDINGS: Urinary Tract:  Unremarkable Bowel:  Unremarkable Vascular/Lymphatic: The left-sided mass abuts and displaces the left internal and external iliac vessels medially. Reproductive: Uterus absent. No specific adnexal abnormality is identified. Other:  No supplemental non-categorized findings. Musculoskeletal: An 11.4 by 6.3 by 8.9 cm mass is present deep to the iliacus and lower psoas muscle on the left. Suspected early invasion of the left upper iliac bone with low-grade edema and enhancement in the marrow. Possible invasion of the upper left SI joint. The mass exerts mass effect on the left L4 and L5 spinal nerves and extends down along the iloipsoas anterior to the left new acetabulum. The mass has intermediate precontrast T1 signal and heterogeneous but primarily high a T2 signal, and demonstrates definite enhancement new especially along its margins new, likely with some central necrosis. IMPRESSION: 1. Large enhancing mass deep to the left iliacus and lower psoas muscles. Sarcoma is the top differential diagnostic consideration. Presumably an unusual neurofibroma or schwannoma might have a similar appearance, but is considered less likely. I am  skeptical that this represents a large abscess related to left sacroiliac septic arthritis given the involvement of the only the upper margin of the SI joint rather than the entire SI joint, but correlation with any fever/leukocytosis is suggested. The mass medially displaces the adjacent iliac vessels and demonstrates likely early invasion of the left upper iliac bone and possible invasion of the left upper SI joint. Electronically Signed   By: Van Clines M.D.   On: 06/01/2017 08:18   Ct Biopsy  Result Date: 06/02/2017 CLINICAL DATA:  Left pelvic mass EXAM: CT GUIDED CORE BIOPSY OF LEFT PELVIC MASS ANESTHESIA/SEDATION: Intravenous Fentanyl and Versed were administered as conscious sedation during continuous monitoring of the patient's level of consciousness and physiological / cardiorespiratory status by the radiology RN, with a total moderate sedation time of 10 minutes. PROCEDURE: The procedure risks, benefits, and alternatives were explained to the patient. Questions regarding the procedure were encouraged and answered. The patient understands and consents to the procedure. Select axial images through the pelvis were obtained. The left iliac fossa  mass was localized and appropriate skin entry site was determined and marked. The operative field was prepped with Betadine/chlorhexidinein a sterile fashion, and a sterile drape was applied covering the operative field. A sterile gown and sterile gloves were used for the procedure. Local anesthesia was provided with 1% Lidocaine. Under CT fluoroscopic guidance, a 17 gauge trocar needle was advanced to the margin of the lesion. Once needle tip position was confirmed, coaxial 18-gauge core biopsy samples were obtained, submitted in formalin to surgical pathology. The guide needle was removed. Postprocedure scans show no hemorrhage or other apparent complication. The patient tolerated the procedure well. COMPLICATIONS: None immediate FINDINGS: Left iliac  fossa mass was localized. Representative core biopsy samples obtained as above. IMPRESSION: 1. Technically successful CT-guided core biopsy, left pelvic mass. Electronically Signed   By: Lucrezia Europe M.D.   On: 06/02/2017 08:31    Scheduled Meds: . feeding supplement (ENSURE ENLIVE)  237 mL Oral BID BM  . gabapentin  100 mg Oral TID  . lisinopril  5 mg Oral Daily  . oxyCODONE  20 mg Oral Q12H   Continuous Infusions: . dextrose 5 % and 0.9% NaCl 100 mL/hr at 06/01/17 1515     LOS: 0 days    Time spent: > 35 minutes  Velvet Bathe, MD [facility] Pager [phone]  If 7PM-7AM, please contact night-coverage [email or web address] Password [email or web address] 06/02/2017, 3:51 PM

## 2017-06-03 ENCOUNTER — Encounter: Payer: Self-pay | Admitting: Adult Health

## 2017-06-03 DIAGNOSIS — R1900 Intra-abdominal and pelvic swelling, mass and lump, unspecified site: Secondary | ICD-10-CM | POA: Diagnosis not present

## 2017-06-03 DIAGNOSIS — R52 Pain, unspecified: Secondary | ICD-10-CM | POA: Diagnosis not present

## 2017-06-03 MED ORDER — HYDROCODONE-ACETAMINOPHEN 5-325 MG PO TABS: 1.0000 | ORAL_TABLET | Freq: Three times a day (TID) | ORAL | 0 refills | Status: DC | PRN

## 2017-06-03 MED ORDER — GABAPENTIN 100 MG PO CAPS: 100.0000 mg | ORAL_CAPSULE | Freq: Three times a day (TID) | ORAL | 0 refills | Status: DC

## 2017-06-03 MED ORDER — OXYCODONE HCL ER 20 MG PO T12A: 20.0000 mg | EXTENDED_RELEASE_TABLET | Freq: Two times a day (BID) | ORAL | 0 refills | Status: DC

## 2017-06-03 MED ORDER — ENSURE ENLIVE PO LIQD: 237.0000 mL | Freq: Two times a day (BID) | ORAL | 12 refills | Status: AC

## 2017-06-03 NOTE — Discharge Summary (Signed)
Physician Discharge Summary  Anita Whitehead WUJ:[id number] DOB: April 06, 1956 DOA: 05/31/2017  PCP: [name], NP  Admit date: 05/31/2017 Discharge date: 06/03/2017  Time spent: > 35 minutes  Recommendations for Outpatient Follow-up:  1. Follow up with Biopsy results of the pelvic mass (completed by IR here at [facility]) 2. Continue pain management thought to be secondary to pelvic mass   Discharge Diagnoses:  Principal Problem:   Intractable pain Active Problems:   Essential hypertension   Pelvic mass in female   Discharge Condition: stable  Diet recommendation: heart healthy  Filed Weights   06/01/17 0129  Weight: 49.9 kg (110 lb)    History of present illness:  61 y.o. female with medical history significant for hypertension and chronic low back pain with radiculopathy, now presenting to the emergency department for evaluation of intractable pain in the left hip/buttock. In context of new diagnosis of pelvic mass on MRI  Hospital Course:  Pelvic mass - s/p biopsy - most likely source of pain currently - recommend patient f/u with her pcp for test results  Intractable pain - most likely from pelvic mass - pain was controlled on long acting opiod and short acting opiods for breakthrough  Procedures:  Biopsy of pelvic mass  Consultations:  IR: Dr Lucrezia Europe  Discharge Exam: Vitals:   06/03/17 0535 06/03/17 0914  BP: 131/84 (!) 148/78  Pulse: 77 86  Resp: 18   Temp: 97.7 F (36.5 C)   SpO2: 100%     General: Pt in nad, alert and awake Cardiovascular: rrr, no rubs Respiratory: no increased wob, no wheezes  Discharge Instructions   Discharge Instructions    Call MD for:  severe uncontrolled pain    Complete by:  As directed    Call MD for:  temperature >100.4    Complete by:  As directed    Diet - low sodium heart healthy    Complete by:  As directed    Discharge instructions    Complete by:  As directed    Please follow up with your primary care  physician for results regarding your biopsy for the undiagnosed pelvic mass. Also they are to continue pain management.   Increase activity slowly    Complete by:  As directed      Current Discharge Medication List    START taking these medications   Details  feeding supplement, ENSURE ENLIVE, (ENSURE ENLIVE) LIQD Take 237 mLs by mouth 2 (two) times daily between meals. Qty: 237 mL, Refills: 12    gabapentin (NEURONTIN) 100 MG capsule Take 1 capsule (100 mg total) by mouth 3 (three) times daily. Qty: 90 capsule, Refills: 0    oxyCODONE (OXYCONTIN) 20 mg 12 hr tablet Take 1 tablet (20 mg total) by mouth every 12 (twelve) hours. Qty: 14 tablet, Refills: 0      CONTINUE these medications which have CHANGED   Details  HYDROcodone-acetaminophen (NORCO/VICODIN) 5-325 MG tablet Take 1-2 tablets by mouth every 8 (eight) hours as needed for moderate pain. Qty: 30 tablet, Refills: 0      CONTINUE these medications which have NOT CHANGED   Details  lisinopril (PRINIVIL,ZESTRIL) 20 MG tablet take 1 tablet by mouth once daily Qty: 90 tablet, Refills: 3   Associated Diagnoses: Essential hypertension       No Known Allergies    The results of significant diagnostics from this hospitalization (including imaging, microbiology, ancillary and laboratory) are listed below for reference.    Significant Diagnostic Studies: Mr  Lumbar Spine W Wo Contrast  Result Date: 05/31/2017 CLINICAL DATA:  Initial evaluation for acute severe left back and buttock pain. EXAM: MRI LUMBAR SPINE WITHOUT AND WITH CONTRAST MRI PELVIS WITHOUT AND WITH CONTRAST TECHNIQUE: Multiplanar and multiecho pulse sequences of the lumbar spine were obtained without and with intravenous contrast. CONTRAST:  29mL MULTIHANCE GADOBENATE DIMEGLUMINE 529 MG/ML IV SOLN COMPARISON:  Prior radiograph from earlier the same day. FINDINGS: MRI LUMBAR SPINE FINDINGS: Segmentation: Normal segmentation. Lowest well-formed disc labeled the  L5-S1 level. Alignment: Normal alignment with preservation of the normal lumbar lordosis. No listhesis. Vertebrae: Vertebral body heights well maintained. No evidence for acute or chronic fracture. Bone marrow signal intensity within normal limits. Benign hemangioma noted within the L3 vertebral body. No worrisome osseous lesions. No abnormal marrow edema. Conus medullaris: Extends to the L1-2 level and appears normal. Paraspinal and other soft tissues: Large heterogeneous enhancing mass at the left pelvis, better evaluated on concomitant pelvic MRI. Left psoas muscles displaced anteriorly by the lesion. Paraspinous soft tissues otherwise within normal limits. T2 hyperintense subcentimeter cyst noted within the left kidney. Visualized visceral structures otherwise grossly unremarkable. Disc levels: L1-2:  Unremarkable. L2-3:  Unremarkable. L3-4:  Normal interspace.  Mild bilateral facet hypertrophy. L4-5: Diffuse disc bulge with disc desiccation. Superimposed small central disc protrusion, slightly eccentric to the left. Mild facet ligamentum flavum hypertrophy. Resultant mild canal with moderate left lateral recess narrowing. Foramina are patent. L5-S1: Diffuse disc bulge with disc desiccation and intervertebral disc space narrowing. Superimposed central disc protrusion with slight inferior migration. Moderate facet arthrosis, greater on the left. Resultant mild canal with moderate left subarticular stenosis. Foramina are patent. MRI PELVIS FINDINGS: There is a large ovoid well-circumscribed mass measuring 9.6 x 6.3 x 11.9 cm positioned within the left hemipelvis. Lesion is isointense to muscle on precontrast T1 sequence, and demonstrates heterogeneous hyperintense T2 signal intensity, with heterogeneous post-contrast enhancement. Lesion lies just medial to the left iliac wing at the level of the left iliacus muscle, and may arise from the iliacus muscle itself. Left psoas muscle is displaced anteriorly and  medially, and is draped over the mass as it courses inferiorly. Iliac vessels also displaced medially. No definite invasion of the underlying left iliac wing. No other mass lesion within the pelvis.  No adenopathy. Visualized bowels within normal limits. Bladder within normal limits. Uterus appears to be absent. Ovaries not discretely identified. No free fluid. Bone marrow signal intensity within normal limits. External soft tissues demonstrate no other acute abnormality. IMPRESSION: 1. 9.6 x 6.3 x 11.9 cm heterogeneous mass within the left pelvis at the level of the left iliacus as above. Primary differential considerations consist of possible sarcomatous tumor, potentially arising from the left iliacus muscle. In neurogenic tumor could also be considered. Further evaluation with histologic sampling recommended, as this lesion would likely be amenable to percutaneous sampling. 2. Degenerative disc bulging and facet arthropathy at L4-5 and L5-S1 with resultant mild canal with moderate left subarticular stenosis. No frank neural impingement within the lumbar spine. Electronically Signed   By: Jeannine Boga M.D.   On: 05/31/2017 21:21   Mr Pelvis W Wo Contrast  Result Date: 06/01/2017 CLINICAL DATA:  Left back and buttock pain. EXAM: MRI PELVIS WITHOUT AND WITH CONTRAST TECHNIQUE: Multiplanar multisequence MR imaging of the pelvis was performed both before and after administration of intravenous contrast. CONTRAST:  65mL MULTIHANCE GADOBENATE DIMEGLUMINE 529 MG/ML IV SOLN COMPARISON:  MRI lumbar spine 05/26/2017; radiographs 05/23/2017 FINDINGS: Urinary Tract:  Unremarkable  Bowel:  Unremarkable Vascular/Lymphatic: The left-sided mass abuts and displaces the left internal and external iliac vessels medially. Reproductive: Uterus absent. No specific adnexal abnormality is identified. Other:  No supplemental non-categorized findings. Musculoskeletal: An 11.4 by 6.3 by 8.9 cm mass is present deep to the  iliacus and lower psoas muscle on the left. Suspected early invasion of the left upper iliac bone with low-grade edema and enhancement in the marrow. Possible invasion of the upper left SI joint. The mass exerts mass effect on the left L4 and L5 spinal nerves and extends down along the iloipsoas anterior to the left new acetabulum. The mass has intermediate precontrast T1 signal and heterogeneous but primarily high a T2 signal, and demonstrates definite enhancement new especially along its margins new, likely with some central necrosis. IMPRESSION: 1. Large enhancing mass deep to the left iliacus and lower psoas muscles. Sarcoma is the top differential diagnostic consideration. Presumably an unusual neurofibroma or schwannoma might have a similar appearance, but is considered less likely. I am skeptical that this represents a large abscess related to left sacroiliac septic arthritis given the involvement of the only the upper margin of the SI joint rather than the entire SI joint, but correlation with any fever/leukocytosis is suggested. The mass medially displaces the adjacent iliac vessels and demonstrates likely early invasion of the left upper iliac bone and possible invasion of the left upper SI joint. Electronically Signed   By: Van Clines M.D.   On: 06/01/2017 08:18   Ct Biopsy  Result Date: 06/02/2017 CLINICAL DATA:  Left pelvic mass EXAM: CT GUIDED CORE BIOPSY OF LEFT PELVIC MASS ANESTHESIA/SEDATION: Intravenous Fentanyl and Versed were administered as conscious sedation during continuous monitoring of the patient's level of consciousness and physiological / cardiorespiratory status by the radiology RN, with a total moderate sedation time of 10 minutes. PROCEDURE: The procedure risks, benefits, and alternatives were explained to the patient. Questions regarding the procedure were encouraged and answered. The patient understands and consents to the procedure. Select axial images through the  pelvis were obtained. The left iliac fossa mass was localized and appropriate skin entry site was determined and marked. The operative field was prepped with Betadine/chlorhexidinein a sterile fashion, and a sterile drape was applied covering the operative field. A sterile gown and sterile gloves were used for the procedure. Local anesthesia was provided with 1% Lidocaine. Under CT fluoroscopic guidance, a 17 gauge trocar needle was advanced to the margin of the lesion. Once needle tip position was confirmed, coaxial 18-gauge core biopsy samples were obtained, submitted in formalin to surgical pathology. The guide needle was removed. Postprocedure scans show no hemorrhage or other apparent complication. The patient tolerated the procedure well. COMPLICATIONS: None immediate FINDINGS: Left iliac fossa mass was localized. Representative core biopsy samples obtained as above. IMPRESSION: 1. Technically successful CT-guided core biopsy, left pelvic mass. Electronically Signed   By: Lucrezia Europe M.D.   On: 06/02/2017 08:31   Xr Hip Unilat W Or W/o Pelvis 1v Left  Result Date: 05/23/2017 AP pelvis and lateral view left hip: No acute fracture. Left hips well located. Left hip joints well preserved.   Microbiology: No results found for this or any previous visit (from the past 240 hour(s)).   Labs: Basic Metabolic Panel:  Recent Labs Lab 05/31/17 1832 05/31/17 2316 06/01/17 0114  NA 138 135 135  K 4.4 4.3 4.1  CL 102 102 102  CO2  --  22 23  GLUCOSE 107* 100* 102*  BUN  12 10 10   CREATININE 0.60 0.73 0.69  CALCIUM  --  9.6 9.7   Liver Function Tests:  Recent Labs Lab 05/31/17 2316  AST 18  ALT 19  ALKPHOS 66  BILITOT 1.3*  PROT 7.1  ALBUMIN 4.3   No results for input(s): LIPASE, AMYLASE in the last 168 hours. No results for input(s): AMMONIA in the last 168 hours. CBC:  Recent Labs Lab 05/31/17 1832 05/31/17 2316 06/01/17 0114  WBC  --  13.6* 12.2*  NEUTROABS  --  10.4*  --    HGB 14.6 13.9 13.8  HCT 43.0 40.5 40.0  MCV  --  90.4 90.5  PLT  --  245 248   Cardiac Enzymes: No results for input(s): CKTOTAL, CKMB, CKMBINDEX, TROPONINI in the last 168 hours. BNP: BNP (last 3 results) No results for input(s): BNP in the last 8760 hours.  ProBNP (last 3 results) No results for input(s): PROBNP in the last 8760 hours.  CBG: No results for input(s): GLUCAP in the last 168 hours.     Signed:  Velvet Bathe MD.  [facility] 06/03/2017, 12:56 PM

## 2017-06-03 NOTE — Progress Notes (Signed)
1335 Pain is more controlled today. Patient states she feels much better. Discharge instructions given with family at bedside. Patient left the hospital with family.

## 2017-06-04 ENCOUNTER — Telehealth (INDEPENDENT_AMBULATORY_CARE_PROVIDER_SITE_OTHER): Payer: Self-pay | Admitting: Surgery

## 2017-06-04 ENCOUNTER — Inpatient Hospital Stay: Payer: 59 | Admitting: Internal Medicine

## 2017-06-04 ENCOUNTER — Telehealth: Payer: Self-pay | Admitting: *Deleted

## 2017-06-04 NOTE — Telephone Encounter (Signed)
This morning when by the hospital to see Mrs. Grandville Silos. She was discharged yesterday. I had seen patient 05/31/2017 ordered stat lumbar spine MRI due to the severe left hip and leg pain that she was having. I received a call with instructions to call the radiologist immediately. I spoke with Dr. Beatris Ship who advised that patient was unable to lay still to complete the MRI but she was also found to have a large left sided pelvic mass. Patient was sent to the emergency room for pain control so she can have the study. MRI pelvis report read large enhancing mass deep to the left iliacus measuring 11.4 x 6.3 x 8.9 cm. Mass exerts mass effect on the left L4 and L5 spinal nerves and extends down along the iliopsoas anterior to the left acetabulum. Sarcoma is a top differential diagnostic considerations. Presumably an unusual neurofibroma or schwannoma might have a similar appearance but is considered less likely. Skeptical that this represents a large abscess related to the left sacroiliac septic arthritis given the involvement of the only the upper margin of the SI joint rather than the entire SI joint, but correlation with any fever/leukocytosis is suggested. The mass medially displaces the adjacent iliac vessels and demonstrates likely early invasion of the left upper iliac bone and possible invasion of the left upper SI joint. Patient was admitted and had CT guided biopsy performed. Results pending. Do not have general surgery or oncology consult. Was discharged by the hospitalist yesterday and advised to follow-up to discuss results of her biopsy with her primary care nurse practitioner. No respiratory tissue was not available today but I did speak with Dr. Macon Large physician within that office. I called patient to ask how she was doing. She requested that I help with referral to have this evaluated. I spoke with a general surgeon here in town and Dr. Ronnette Hila at [facility] was recommended. Spoke with Dr.  Clovis Riley and appointment was made for November 14. I advised patient of the appointment and she has been put on a cancellation list to possibly be seen sooner. I advised patient to contact me immediately before her appointment if she has worsening pain or change in her condition. Patient was very appreciative for the referral. All questions answered.

## 2017-06-04 NOTE — Telephone Encounter (Signed)
error 

## 2017-06-04 NOTE — Telephone Encounter (Signed)
Transition Care Management Follow-up Telephone Call  Per Discharge Summary: Admit date: 05/31/2017 Discharge date: 06/03/2017  Time spent: > 35 minutes  Recommendations for Outpatient Follow-up:  1. Follow up with Biopsy results of the pelvic mass (completed by IR here at [facility]) 2. Continue pain management thought to be secondary to pelvic mass   Discharge Diagnoses:  Principal Problem:   Intractable pain Active Problems:   Essential hypertension   Pelvic mass in female   Discharge Condition: stable  Diet recommendation: heart healthy  --   How have you been since you were released from the hospital? "I'm okay. I got my pain medicine so I'm fine."   Do you understand why you were in the hospital? yes   Do you understand the discharge instructions? yes   Where were you discharged to? Home   Items Reviewed:  Medications reviewed: yes  Allergies reviewed: yes  Dietary changes reviewed: no, none made per pt  Referrals reviewed: yes   Functional Questionnaire:   Activities of Daily Living (ADLs):   She states they are independent in the following: ambulation, bathing and hygiene, feeding, continence, grooming, toileting and dressing States they require assistance with the following: none   Any transportation issues/concerns?: no   Any patient concerns? Yes, she is waiting on biopsy results from MRI.   Confirmed importance and date/time of follow-up visits scheduled yes  Provider Appointment booked with [name], NP 06/05/2017 @ 3:00pm  Confirmed with patient if condition begins to worsen call PCP or go to the ER.  Patient was given the office number and encouraged to call back with question or concerns.  : yes

## 2017-06-05 ENCOUNTER — Telehealth: Payer: Self-pay | Admitting: Emergency Medicine

## 2017-06-05 ENCOUNTER — Encounter: Payer: Self-pay | Admitting: Adult Health

## 2017-06-05 ENCOUNTER — Ambulatory Visit (INDEPENDENT_AMBULATORY_CARE_PROVIDER_SITE_OTHER): Payer: 59 | Admitting: Adult Health

## 2017-06-05 VITALS — BP 130/86 | Temp 97.4°F | Wt 118.0 lb

## 2017-06-05 DIAGNOSIS — R1900 Intra-abdominal and pelvic swelling, mass and lump, unspecified site: Secondary | ICD-10-CM

## 2017-06-05 NOTE — Progress Notes (Signed)
Subjective:    Patient ID: Anita Whitehead, female    DOB: 1955/11/23, 61 y.o.   MRN: [id number]  HPI   61 year old female who  has a past medical history of Allergy; Cervical cancer ([place]) (1989); History of colon polyps; Hyperlipidemia; Hypertension; and PMS (premenstrual syndrome). She presents to the office today for TCM visit.   She was admitted on 05/31/2017  She was discharged on 06/03/2017   She had presented to the ER for evaluation of intractable pain in the left hip and buttock in context of a new diagnosis of pelvic mass on MRI. MRi results showed large enhancing mass deep to the left iliacus measuring 11.4 x 6.3 x 8.9 cm. Mass exerts mass effect on the left L4 and L5 spinal nerves and extends down along the iliopsoas anterior to the left acetabulum.  She was prescribed Norco and then Oxycontin 20 mg for break through pain.   Today in the office she is hoping that the results of her biopsy results have returned. She does state that her pain is well controlled with her current medication. Denies any constipation but he was encouraged to use a stool softener. She continues to have numbness and tingling down her left leg.   She denies any issues with bowel or bladder   She has heard from orthopedics who made her an appointment with General Surgeon Dr. Ronnette Hila at [facility]. This appointment is set for November 14th.   .Review of Systems See HPI   Past Medical History:  Diagnosis Date  . Allergy   . Cervical cancer ([place]) 1989  . History of colon polyps    TA   . Hyperlipidemia   . Hypertension   . PMS (premenstrual syndrome)     Social History   Social History  . Marital status: Married    Spouse name: N/A  . Number of children: N/A  . Years of education: N/A   Occupational History  . Not on file.   Social History Main Topics  . Smoking status: Former Research scientist (life sciences)  . Smokeless tobacco: Never Used     Comment: hooka  . Alcohol use 0.0 oz/week   Comment: 3 beers a week and 2 cocktails during the week- 1 drink a day   . Drug use: No  . Sexual activity: Not on file   Other Topics Concern  . Not on file   Social History Narrative   Civil engineer, contracting for [employer]   Married for 16 years   2 boys, one in [place] and one in [place]    Past Surgical History:  Procedure Laterality Date  . ABDOMINAL HYSTERECTOMY    . adenomatous colon polyps    . COLONOSCOPY     04-01-2009,2006 with [facility] [place]   . POLYPECTOMY      Family History  Problem Relation Age of Onset  . Diabetes Brother   . Hypertension Mother   . Cancer Mother        Unknown  . Hypertension Sister   . Cancer Sister        Breast, uterine  . Colon cancer Neg Hx   . Colon polyps Neg Hx   . Esophageal cancer Neg Hx   . Rectal cancer Neg Hx   . Stomach cancer Neg Hx     No Known Allergies  Current Outpatient Prescriptions on File Prior to Visit  Medication Sig Dispense Refill  . feeding supplement, ENSURE ENLIVE, (ENSURE ENLIVE) LIQD Take  237 mLs by mouth 2 (two) times daily between meals. 237 mL 12  . gabapentin (NEURONTIN) 100 MG capsule Take 1 capsule (100 mg total) by mouth 3 (three) times daily. 90 capsule 0  . HYDROcodone-acetaminophen (NORCO/VICODIN) 5-325 MG tablet Take 1-2 tablets by mouth every 8 (eight) hours as needed for moderate pain. 30 tablet 0  . lisinopril (PRINIVIL,ZESTRIL) 20 MG tablet take 1 tablet by mouth once daily 90 tablet 3  . oxyCODONE (OXYCONTIN) 20 mg 12 hr tablet Take 1 tablet (20 mg total) by mouth every 12 (twelve) hours. (Patient not taking: Reported on 06/05/2017) 14 tablet 0   No current facility-administered medications on file prior to visit.     BP 130/86 (BP Location: Left Arm)   Temp (!) 97.4 F (36.3 C) (Oral)   Wt 118 lb (53.5 kg)   BMI 21.58 kg/m       Objective:   Physical Exam  Constitutional: She is oriented to person, place, and time. She appears well-developed and well-nourished.  No distress.  Cardiovascular: Normal rate, regular rhythm, normal heart sounds and intact distal pulses.  Exam reveals no gallop and no friction rub.   No murmur heard. Pulmonary/Chest: Effort normal and breath sounds normal.  Neurological: She is alert and oriented to person, place, and time.  Skin: Skin is warm and dry. She is not diaphoretic.  Psychiatric: She has a normal mood and affect. Her behavior is normal. Judgment and thought content normal.  Nursing note and vitals reviewed.     Assessment & Plan:  1. Pelvic mass in female - reviewed labs and imaging during this visit with the patient  - Biopsy results have not resulted yet. Either I or orthopedics will let her know once they have returned.  - Continue with current pain medication  - Consider referral to oncologist; if needed  [name], NP

## 2017-06-05 NOTE — Telephone Encounter (Signed)
Mack Guise from [employer] called and wanted patient PCP to look over the note that was documented in patient chart from 06/04/2017. PCP has been following patient issues with left hip pain and Mack Guise wanted PCP to know patient does have an appointment set up with general surgeon Dr. Lennie Odor with [facility] Nov 14.Patient also has an appointment with PCP today . [name] wanted PCP to be aware.

## 2017-06-06 ENCOUNTER — Ambulatory Visit (INDEPENDENT_AMBULATORY_CARE_PROVIDER_SITE_OTHER): Payer: 59 | Admitting: Physician Assistant

## 2017-06-07 ENCOUNTER — Telehealth: Payer: Self-pay | Admitting: Adult Health

## 2017-06-07 DIAGNOSIS — R1900 Intra-abdominal and pelvic swelling, mass and lump, unspecified site: Secondary | ICD-10-CM

## 2017-06-07 NOTE — Telephone Encounter (Signed)
Spoke to [employer] and informed her of her biopsy results. Her results came back positive for malignancy She has a  left iliacus mass  measuring 11.4 x 6.3 x 8.9 cm  We spoke about options, she does have an appointment with Surgical Oncology at [facility] for Nov 15th.   She would like to try and get something at [facility] sooner

## 2017-06-07 NOTE — Telephone Encounter (Signed)
Pt is calling Anita Whitehead back concerning test ?pelvic

## 2017-06-08 NOTE — Telephone Encounter (Signed)
Pt has an appt.

## 2017-06-11 ENCOUNTER — Other Ambulatory Visit: Payer: Self-pay

## 2017-06-11 ENCOUNTER — Observation Stay (HOSPITAL_COMMUNITY)
Admission: EM | Admit: 2017-06-11 | Discharge: 2017-06-12 | Disposition: A | Discharge status: Home / Self Care | Payer: 59 | Attending: Family Medicine | Admitting: Family Medicine

## 2017-06-11 ENCOUNTER — Encounter (HOSPITAL_COMMUNITY): Payer: Self-pay | Admitting: Family Medicine

## 2017-06-11 DIAGNOSIS — E785 Hyperlipidemia, unspecified: Secondary | ICD-10-CM | POA: Diagnosis not present

## 2017-06-11 DIAGNOSIS — M25552 Pain in left hip: Principal | ICD-10-CM

## 2017-06-11 DIAGNOSIS — R1900 Intra-abdominal and pelvic swelling, mass and lump, unspecified site: Secondary | ICD-10-CM | POA: Diagnosis not present

## 2017-06-11 DIAGNOSIS — Z79899 Other long term (current) drug therapy: Secondary | ICD-10-CM | POA: Insufficient documentation

## 2017-06-11 DIAGNOSIS — I10 Essential (primary) hypertension: Secondary | ICD-10-CM | POA: Diagnosis not present

## 2017-06-11 DIAGNOSIS — G893 Neoplasm related pain (acute) (chronic): Secondary | ICD-10-CM | POA: Diagnosis not present

## 2017-06-11 DIAGNOSIS — R52 Pain, unspecified: Secondary | ICD-10-CM | POA: Diagnosis not present

## 2017-06-11 DIAGNOSIS — E782 Mixed hyperlipidemia: Secondary | ICD-10-CM | POA: Diagnosis not present

## 2017-06-11 DIAGNOSIS — Z87891 Personal history of nicotine dependence: Secondary | ICD-10-CM | POA: Diagnosis not present

## 2017-06-11 LAB — COMPREHENSIVE METABOLIC PANEL
ALBUMIN: 4.4 g/dL (ref 3.5–5.0)
ALK PHOS: 82 U/L (ref 38–126)
ALT: 72 U/L — AB (ref 14–54)
ANION GAP: 13 (ref 5–15)
AST: 34 U/L (ref 15–41)
BILIRUBIN TOTAL: 0.6 mg/dL (ref 0.3–1.2)
BUN: 17 mg/dL (ref 6–20)
CALCIUM: 10 mg/dL (ref 8.9–10.3)
CO2: 28 mmol/L (ref 22–32)
CREATININE: 0.71 mg/dL (ref 0.44–1.00)
Chloride: 98 mmol/L — ABNORMAL LOW (ref 101–111)
GFR calc Af Amer: >60 mL/min (ref >60)
GFR calc non Af Amer: >60 mL/min (ref >60)
GLUCOSE: 119 mg/dL — AB (ref 65–99)
Potassium: 4.7 mmol/L (ref 3.5–5.1)
SODIUM: 139 mmol/L (ref 135–145)
TOTAL PROTEIN: 8.4 g/dL — AB (ref 6.5–8.1)

## 2017-06-11 LAB — URINALYSIS, ROUTINE W REFLEX MICROSCOPIC
BACTERIA UA: NONE SEEN
BILIRUBIN URINE: NEGATIVE
Glucose, UA: NEGATIVE mg/dL
Hgb urine dipstick: NEGATIVE
Ketones, ur: 5 mg/dL — AB
LEUKOCYTES UA: NEGATIVE
Nitrite: NEGATIVE
PH: 6 (ref 5.0–8.0)
PROTEIN: NEGATIVE mg/dL
SPECIFIC GRAVITY, URINE: 1.011 (ref 1.005–1.030)
SQUAMOUS EPITHELIAL / LPF: NONE SEEN

## 2017-06-11 LAB — CBC WITH DIFFERENTIAL/PLATELET
BASOS PCT: 0 %
Basophils Absolute: 0 10*3/uL (ref 0.0–0.1)
EOS ABS: 0.1 10*3/uL (ref 0.0–0.7)
Eosinophils Relative: 0 %
HEMATOCRIT: 44 % (ref 36.0–46.0)
HEMOGLOBIN: 15.3 g/dL — AB (ref 12.0–15.0)
Lymphocytes Relative: 17 %
Lymphs Abs: 2 10*3/uL (ref 0.7–4.0)
MCH: 31.8 pg (ref 26.0–34.0)
MCHC: 34.8 g/dL (ref 30.0–36.0)
MCV: 91.5 fL (ref 78.0–100.0)
MONOS PCT: 7 %
Monocytes Absolute: 0.8 10*3/uL (ref 0.1–1.0)
NEUTROS ABS: 8.7 10*3/uL — AB (ref 1.7–7.7)
NEUTROS PCT: 76 %
Platelets: 226 10*3/uL (ref 150–400)
RBC: 4.81 MIL/uL (ref 3.87–5.11)
RDW: 12.6 % (ref 11.5–15.5)
WBC: 11.5 10*3/uL — AB (ref 4.0–10.5)

## 2017-06-11 MED ORDER — METHOCARBAMOL 1000 MG/10ML IJ SOLN: 500.0000 mg | Freq: Once | INTRAMUSCULAR | Status: DC

## 2017-06-11 MED ORDER — OXYCODONE HCL 5 MG PO TABS
5.0000 mg | ORAL_TABLET | ORAL | Status: DC | PRN
  Administered 2017-06-12: 10 mg via ORAL
  Filled 2017-06-11: qty 2

## 2017-06-11 MED ORDER — HYDROMORPHONE HCL 1 MG/ML IJ SOLN
1.0000 mg | Freq: Once | INTRAMUSCULAR | Status: AC
  Administered 2017-06-11: 1 mg via INTRAVENOUS
  Filled 2017-06-11: qty 1

## 2017-06-11 MED ORDER — SODIUM CHLORIDE 0.9 % IV SOLN
INTRAVENOUS | Status: AC
  Administered 2017-06-11: 22:00:00 via INTRAVENOUS

## 2017-06-11 MED ORDER — ACETAMINOPHEN 325 MG PO TABS: 650.0000 mg | ORAL_TABLET | Freq: Four times a day (QID) | ORAL | Status: DC | PRN

## 2017-06-11 MED ORDER — GABAPENTIN 100 MG PO CAPS
200.0000 mg | ORAL_CAPSULE | Freq: Three times a day (TID) | ORAL | Status: DC
  Administered 2017-06-11 – 2017-06-12 (×2): 200 mg via ORAL
  Filled 2017-06-11 (×2): qty 2

## 2017-06-11 MED ORDER — OXYCODONE HCL ER 10 MG PO T12A
20.0000 mg | EXTENDED_RELEASE_TABLET | Freq: Two times a day (BID) | ORAL | Status: DC
Start: 2017-06-11 — End: 2017-06-12
  Administered 2017-06-11 – 2017-06-12 (×2): 20 mg via ORAL
  Filled 2017-06-11 (×2): qty 2

## 2017-06-11 MED ORDER — SODIUM CHLORIDE 0.9 % IV BOLUS (SEPSIS)
1000.0000 mL | Freq: Once | INTRAVENOUS | Status: AC
  Administered 2017-06-11: 1000 mL via INTRAVENOUS

## 2017-06-11 MED ORDER — HYDROMORPHONE HCL 1 MG/ML IJ SOLN
1.0000 mg | INTRAMUSCULAR | Status: DC | PRN
  Administered 2017-06-11 – 2017-06-12 (×3): 1 mg via INTRAVENOUS
  Filled 2017-06-11 (×3): qty 1

## 2017-06-11 MED ORDER — ENOXAPARIN SODIUM 40 MG/0.4ML ~~LOC~~ SOLN
40.0000 mg | Freq: Every day | SUBCUTANEOUS | Status: DC
  Administered 2017-06-11: 40 mg via SUBCUTANEOUS
  Filled 2017-06-11: qty 0.4

## 2017-06-11 MED ORDER — ACETAMINOPHEN 650 MG RE SUPP: 650.0000 mg | Freq: Four times a day (QID) | RECTAL | Status: DC | PRN

## 2017-06-11 MED ORDER — SENNA 8.6 MG PO TABS
1.0000 | ORAL_TABLET | Freq: Two times a day (BID) | ORAL | Status: DC
  Administered 2017-06-11 – 2017-06-12 (×2): 8.6 mg via ORAL
  Filled 2017-06-11 (×2): qty 1

## 2017-06-11 MED ORDER — METHOCARBAMOL 1000 MG/10ML IJ SOLN
500.0000 mg | Freq: Once | INTRAVENOUS | Status: AC
  Administered 2017-06-11: 500 mg via INTRAVENOUS
  Filled 2017-06-11: qty 550

## 2017-06-11 MED ORDER — BISACODYL 10 MG RE SUPP: 10.0000 mg | Freq: Every day | RECTAL | Status: DC | PRN

## 2017-06-11 MED ORDER — LISINOPRIL 20 MG PO TABS
20.0000 mg | ORAL_TABLET | Freq: Every day | ORAL | Status: DC
  Administered 2017-06-12: 20 mg via ORAL
  Filled 2017-06-11 (×2): qty 1

## 2017-06-11 MED ORDER — KETOROLAC TROMETHAMINE 30 MG/ML IJ SOLN: 30.0000 mg | Freq: Four times a day (QID) | INTRAMUSCULAR | Status: DC | PRN

## 2017-06-11 MED ORDER — FENTANYL CITRATE (PF) 100 MCG/2ML IJ SOLN
50.0000 ug | Freq: Once | INTRAMUSCULAR | Status: AC
  Administered 2017-06-11: 50 ug via INTRAVENOUS
  Filled 2017-06-11: qty 2

## 2017-06-11 MED ORDER — ONDANSETRON HCL 4 MG PO TABS
4.0000 mg | ORAL_TABLET | Freq: Four times a day (QID) | ORAL | Status: DC | PRN
Start: 2017-06-11 — End: 2017-06-12

## 2017-06-11 MED ORDER — POLYETHYLENE GLYCOL 3350 17 G PO PACK: 17.0000 g | PACK | Freq: Every day | ORAL | Status: DC | PRN

## 2017-06-11 MED ORDER — ONDANSETRON HCL 4 MG/2ML IJ SOLN: 4.0000 mg | Freq: Four times a day (QID) | INTRAMUSCULAR | Status: DC | PRN

## 2017-06-11 MED ORDER — LORAZEPAM 2 MG/ML IJ SOLN
1.0000 mg | Freq: Once | INTRAMUSCULAR | Status: AC
  Administered 2017-06-11: 1 mg via INTRAVENOUS
  Filled 2017-06-11: qty 1

## 2017-06-11 NOTE — ED Triage Notes (Signed)
Patient is complaining of pelvic pain, left hip, and left upper thigh pain. Patient was newly diagnosed with neurofibrosarcoma nerve sheath tumor and waiting on oncologist appointment. Patient was admitted at [facility] for pain control last week.

## 2017-06-11 NOTE — ED Provider Notes (Signed)
[facility] DEPT Provider Note   CSN: [id number] Arrival date & time: 06/11/17  1240     History   Chief Complaint Chief Complaint  Patient presents with  . Pelvic Pain  . Hip Pain    HPI Anita Whitehead is a 61 y.o. 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 Patient had been seeing [facility] Rosanna Randy and Ricard Dillon) providers for left hip pain that was initially felt to be either sciatica or hip bursitis, she had had an injection which did not relieve the symptoms so an MRI had been ordered. 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 She describes the pain as 8/10 constant sharp and pressure-like pain "like something is sitting on the nerve", located in the left groin radiating into the left buttock, worse with stretching her leg out, sitting or laying on the left side, and minimally improved with heating pad as well as her pain medications. She has not taken anything else for her symptoms. She also has associated paresthesias in the left calf which has been  unchanged since her diagnosis. She was seen on 06/05/17 by her PCP Dr. Tylene Fantasia [facility]) who [facility], but she doesn't yet have an appt; she has an appt 06/20/17 with the oncologist at [facility], however she prefers to go to [facility] and is awaiting to hear back from the cancer center about an appt. She denies any fevers, chills, CP, SOB, abd pain, N/V/D/C, hematuria, dysuria, incontinence of urine/stool, saddle anesthesia/cauda equina symptoms, focal weakness, or any other complaints at this time.    The history is provided by the patient and medical records. No language interpreter was used.  Hip Pain  This is a recurrent problem. The current episode started more than 1 week ago. The problem occurs constantly. The problem has been gradually worsening. Pertinent negatives include no chest pain, no abdominal pain and no shortness of breath. Exacerbated by: straightening leg, laying/sitting on left side. Nothing relieves the symptoms. She has tried rest (and gabapentin 100mg  TID, oxycontin 20mg  q12h, and hydrocodone-APAP 5-325mg  q8h PRN) for the symptoms. The treatment provided mild relief.    Past Medical History:  Diagnosis Date  . Allergy   . Cervical cancer ([place]) 1989  . History of colon polyps    TA   . Hyperlipidemia   . Hypertension   . PMS (premenstrual syndrome)     Patient Active Problem List   Diagnosis Date Noted  . Intractable pain 05/31/2017  . Pelvic mass in female 05/31/2017  . Essential hypertension 05/06/2014  . Atrophic vaginitis 04/02/2012  . Routine general medical examination at a health  care facility 04/02/2012  . HEARTBURN 02/24/2009  . 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 Hyperlipidemia 05/14/2007  . ALLERGIC RHINITIS 05/14/2007    Past Surgical History:  Procedure Laterality Date  . ABDOMINAL HYSTERECTOMY    . adenomatous colon polyps    . COLONOSCOPY     04-01-2009,2006 with [facility]   . POLYPECTOMY       OB History    Gravida Para Term Preterm AB Living   2 2           SAB TAB Ectopic Multiple Live Births                   Home Medications    Prior to Admission medications   Medication Sig Start Date End Date Taking? Authorizing Provider  feeding supplement, ENSURE ENLIVE, (ENSURE ENLIVE) LIQD Take 237 mLs by mouth 2 (two) times daily between meals. 06/03/17  Yes Velvet Bathe, MD  gabapentin (NEURONTIN) 100 MG capsule Take 1 capsule (100 mg total) by mouth 3 (three) times daily. 06/03/17  Yes Velvet Bathe, MD  HYDROcodone-acetaminophen (NORCO/VICODIN) 5-325 MG tablet Take 1-2 tablets by mouth every 8 (eight) hours as needed for moderate pain. 06/03/17  Yes Velvet Bathe, MD  lisinopril (PRINIVIL,ZESTRIL) 20 MG tablet take 1 tablet by mouth once daily 12/05/16  Yes Nafziger, Tommi Rumps, NP  oxyCODONE (OXYCONTIN) 20 mg 12 hr tablet Take 1 tablet (20 mg total) by mouth every 12 (twelve) hours. 06/03/17  Yes Velvet Bathe, MD    Family History Family History  Problem Relation Age of Onset  . Diabetes Brother   . Hypertension Mother   . Cancer Mother        Unknown  . Hypertension Sister   . Cancer Sister        Breast, uterine  . Colon cancer Neg Hx   . Colon polyps Neg Hx   . Esophageal cancer Neg Hx   . Rectal cancer Neg Hx   . Stomach cancer Neg Hx     Social History Social History   Tobacco Use  . Smoking status: Former Research scientist (life sciences)  . Smokeless tobacco: Never Used  . Tobacco comment: hooka  Substance Use Topics  . Alcohol use: Yes    Alcohol/week: 0.0 oz    Comment: 3 beers a week and 2 cocktails during the week- 1 drink a day. Last drink: About a month ago.   . Drug use: No     Allergies   Patient has no known allergies.   Review of Systems Review of Systems  Constitutional: Negative for chills and fever.  Respiratory: Negative for shortness of breath.   Cardiovascular: Negative for chest pain.  Gastrointestinal: Negative for abdominal pain, constipation,  diarrhea, nausea and vomiting.  Genitourinary: Positive for pelvic pain. Negative for difficulty urinating (no incontinence), dysuria, hematuria, vaginal bleeding and vaginal discharge.  Musculoskeletal: Positive for myalgias. Negative for arthralgias.  Skin: Negative for color change.  Allergic/Immunologic: Negative for immunocompromised state.  Neurological: Positive for numbness (paresthesias L calf). Negative for weakness.  Psychiatric/Behavioral: Negative for confusion.   All other systems reviewed and are negative for acute change except as noted in the HPI.    Physical Exam Updated Vital Signs BP 129/89 (BP Location: Left Arm)   Pulse (!) 123   Temp 97.9 F (36.6 C) (Oral)   Resp (!) 22   Ht 5\' 2"  (1.575 m)   Wt 53.5 kg (118 lb)   SpO2 97%   BMI 21.58 kg/m  Physical Exam  Constitutional: She is oriented to person, place, and time. Vital signs are normal. She appears well-developed and well-nourished.  Non-toxic appearance. No distress.  Afebrile, nontoxic, NAD although appears uncomfortable laying in bed, has knees pulled up towards her chest  HENT:  Head: Normocephalic and atraumatic.  Mouth/Throat: Oropharynx is clear and moist and mucous membranes are normal.  Eyes: Conjunctivae and EOM are normal. Right eye exhibits no discharge. Left eye exhibits no discharge.  Neck: Normal range of motion. Neck supple.  Cardiovascular: Regular rhythm, normal heart sounds and intact distal pulses. Tachycardia present. Exam reveals no gallop and no friction rub.  No murmur heard. HR 110s during evaluation, likely pain related.   Pulmonary/Chest: Effort normal and breath sounds normal. No respiratory distress. She has no decreased breath sounds. She has no wheezes. She has no rhonchi. She has no rales.  Abdominal: Soft. Normal appearance and bowel sounds are normal. She exhibits no distension. There is no tenderness. There is no rigidity, no rebound, no guarding, no CVA tenderness, no  tenderness at McBurney's point and negative Murphy's sign.  Musculoskeletal: Normal range of motion.       Left hip: She exhibits tenderness. She exhibits normal range of motion, no bony tenderness, no swelling, no crepitus and no deformity.       Lumbar back: She exhibits tenderness and spasm. She exhibits normal range of motion, no bony tenderness, no swelling and no deformity.       Back:       Legs: L hip with FROM intact, with no bony or joint line TTP, moderate anterior hip TTP at the insertion of iliopsoas site and moderate TTP and spasm just above the iliac crest posteriorly but no focal bony or midline spinal TTP, no bruising or swelling, no crepitus or deformity, no limb length discrepancy or abnormal rotation. No pain with log roll testing. Strength grossly intact, sensation slightly altered/diminished to L lateral thigh and L calf although still grossly intact, distal pulses intact, compartments soft. Neg homan's sign, no pedal edema.  Neurological: She is alert and oriented to person, place, and time. She has normal strength. No sensory deficit.  Skin: Skin is warm, dry and intact. No rash noted.  Psychiatric: She has a normal mood and affect.  Nursing note and vitals reviewed.    ED Treatments / Results  Labs (all labs ordered are listed, but only abnormal results are displayed) Labs Reviewed  CBC WITH DIFFERENTIAL/PLATELET - Abnormal; Notable for the following components:      Result Value   WBC 11.5 (*)    Hemoglobin 15.3 (*)    Neutro Abs 8.7 (*)    All other components within normal limits  COMPREHENSIVE METABOLIC PANEL - Abnormal; Notable for the following components:   Chloride 98 (*)    Glucose, Bld 119 (*)    Total Protein 8.4 (*)    ALT 72 (*)    All other components within normal limits  URINALYSIS, ROUTINE W REFLEX MICROSCOPIC - Abnormal; Notable for the following components:   Ketones, ur 5 (*)    All other components within normal limits    EKG  EKG  Interpretation None       Radiology No results found.  05/31/17 MRI Pelvis w/wo contrast: Study Result  CLINICAL DATA:  Left back and buttock pain.  EXAM: MRI PELVIS WITHOUT AND WITH CONTRAST  TECHNIQUE: Multiplanar multisequence MR imaging of the pelvis was performed both before and after administration of intravenous contrast.  CONTRAST:  58mL MULTIHANCE GADOBENATE DIMEGLUMINE 529 MG/ML IV SOLN  COMPARISON:  MRI lumbar spine 05/26/2017; radiographs 05/23/2017  FINDINGS: Urinary Tract:  Unremarkable  Bowel:  Unremarkable  Vascular/Lymphatic: The left-sided mass abuts and displaces the left internal and external iliac vessels medially.  Reproductive: Uterus absent. No specific adnexal abnormality is identified.  Other:  No supplemental non-categorized findings.  Musculoskeletal: An 11.4 by 6.3 by 8.9 cm mass is present deep to the iliacus and lower psoas muscle on the left. Suspected early invasion of the left upper iliac bone with low-grade edema and enhancement in the marrow. Possible invasion of the upper left SI joint. The mass exerts mass effect on the left L4 and L5 spinal nerves and extends down along the iloipsoas anterior to the left new acetabulum. The mass has intermediate precontrast T1 signal and heterogeneous but primarily high a T2 signal, and demonstrates definite enhancement new especially along its margins new, likely with some central necrosis.  IMPRESSION: 1. Large enhancing mass deep to the left iliacus and lower psoas muscles. Sarcoma is the top differential diagnostic consideration. Presumably an unusual neurofibroma or schwannoma might have a similar appearance, but is considered less likely. I am skeptical that this represents a large abscess related to left sacroiliac septic arthritis given the involvement of the only the upper margin of the SI joint rather than the entire SI joint, but correlation with any fever/leukocytosis  is suggested. The mass medially displaces the adjacent iliac vessels and demonstrates likely early invasion of the left upper iliac bone and possible invasion of the left upper SI joint.   Electronically Signed   By: Van Clines M.D.   On: 06/01/2017 08:18    05/31/17 MRI Lumbar Spine w/wo contrast: Study Result  CLINICAL DATA:  Initial evaluation for acute severe left back and buttock pain.  EXAM: MRI LUMBAR SPINE WITHOUT AND WITH CONTRAST  MRI PELVIS WITHOUT AND WITH CONTRAST  TECHNIQUE: Multiplanar and multiecho pulse sequences of the lumbar spine were obtained without and with intravenous contrast.  CONTRAST:  82mL MULTIHANCE GADOBENATE DIMEGLUMINE 529 MG/ML IV SOLN  COMPARISON:  Prior radiograph from earlier the same day.  FINDINGS: MRI LUMBAR SPINE FINDINGS:  Segmentation: Normal segmentation. Lowest well-formed disc labeled the L5-S1 level.  Alignment: Normal alignment with preservation of the normal lumbar lordosis. No listhesis.  Vertebrae: Vertebral body heights well maintained. No evidence for acute or chronic fracture. Bone marrow signal intensity within normal limits. Benign hemangioma noted within the L3 vertebral body. No worrisome osseous lesions. No abnormal marrow edema.  Conus medullaris: Extends to the L1-2 level and appears normal.  Paraspinal and other soft tissues: Large heterogeneous enhancing mass at the left pelvis, better evaluated on concomitant pelvic MRI. Left psoas muscles displaced anteriorly by the lesion. Paraspinous soft tissues otherwise within normal limits. T2 hyperintense subcentimeter cyst noted within the left kidney. Visualized visceral structures otherwise grossly unremarkable.  Disc levels:  L1-2:  Unremarkable.  L2-3:  Unremarkable.  L3-4:  Normal interspace.  Mild bilateral facet hypertrophy.  L4-5: Diffuse disc bulge with disc desiccation. Superimposed small central disc protrusion,  slightly eccentric to the left. Mild facet ligamentum flavum hypertrophy. Resultant mild canal with moderate left lateral recess narrowing. Foramina are patent.  L5-S1: Diffuse disc bulge with disc desiccation and intervertebral disc space narrowing. Superimposed central disc protrusion with slight inferior migration. Moderate facet arthrosis, greater on the left. Resultant mild canal with moderate left subarticular stenosis. Foramina are patent.  MRI PELVIS FINDINGS:  There is  a large ovoid well-circumscribed mass measuring 9.6 x 6.3 x 11.9 cm positioned within the left hemipelvis. Lesion is isointense to muscle on precontrast T1 sequence, and demonstrates heterogeneous hyperintense T2 signal intensity, with heterogeneous post-contrast enhancement. Lesion lies just medial to the left iliac wing at the level of the left iliacus muscle, and may arise from the iliacus muscle itself. Left psoas muscle is displaced anteriorly and medially, and is draped over the mass as it courses inferiorly. Iliac vessels also displaced medially. No definite invasion of the underlying left iliac wing.  No other mass lesion within the pelvis.  No adenopathy.  Visualized bowels within normal limits.  Bladder within normal limits. Uterus appears to be absent. Ovaries not discretely identified.  No free fluid.  Bone marrow signal intensity within normal limits. External soft tissues demonstrate no other acute abnormality.  IMPRESSION: 1. 9.6 x 6.3 x 11.9 cm heterogeneous mass within the left pelvis at the level of the left iliacus as above. Primary differential considerations consist of possible sarcomatous tumor, potentially arising from the left iliacus muscle. In neurogenic tumor could also be considered. Further evaluation with histologic sampling recommended, as this lesion would likely be amenable to percutaneous sampling. 2. Degenerative disc bulging and facet arthropathy at L4-5  and L5-S1 with resultant mild canal with moderate left subarticular stenosis. No frank neural impingement within the lumbar spine.   Electronically Signed   By: Jeannine Boga M.D.   On: 05/31/2017 21:21     Procedures Procedures (including critical care time)  Medications Ordered in ED Medications  sodium chloride 0.9 % bolus 1,000 mL (0 mLs Intravenous Stopped 06/11/17 1553)  fentaNYL (SUBLIMAZE) injection 50 mcg (50 mcg Intravenous Given 06/11/17 1505)  LORazepam (ATIVAN) injection 1 mg (1 mg Intravenous Given 06/11/17 1505)  HYDROmorphone (DILAUDID) injection 1 mg (1 mg Intravenous Given 06/11/17 1640)  sodium chloride 0.9 % bolus 1,000 mL (0 mLs Intravenous Stopped 06/11/17 1739)  methocarbamol (ROBAXIN) 500 mg in dextrose 5 % 50 mL IVPB (0 mg Intravenous Stopped 06/11/17 1739)  HYDROmorphone (DILAUDID) injection 1 mg (1 mg Intravenous Given 06/11/17 1817)     Initial Impression / Assessment and Plan / ED Course  I have reviewed the triage vital signs and the nursing notes.  Pertinent labs & imaging results that were available during my care of the patient were reviewed by me and considered in my medical decision making (see chart for details).     61 y.o. female here with intractable L hip pain, newly diagnosed with nerve sheath tumor pressing on the iliopsoas, has not yet gotten in with oncology. Was admitted 10/25-28/18 for intractable pain and CT guided biopsy, and discharged with gabapentin 100mg  TID, oxycontin 20mg  q12h, and hydrocodone-APAP 5-325mg  q8h PRN which were initially helping until 3 days ago when the pain became worse and more intractable. On exam, tenderness to anterior hip where iliopsoas inserts, and to just above the iliacus on the L side, no focal bony or midline spinal TTP, mildly diminished/altered sensation to L calf and L lateral thigh, but overall NVI with soft compartments, mild spasm in iliopsoas and anterior hip musculature. Will get basic labs and  U/A to see for any changes and just in case she requires admission but doubt need for imaging at this time; will attempt to get her pain controlled; will give fluids as well, pt slightly tachycardic likely pain related. Will reassess shortly.   4:38 PM CBC w/diff with marginally elevated WBC but also appears hemoconcentrated therefore  doubt this is clinically significant. CMP with marginally low Cl and marginally elevated ALT 72, otherwise WNL. U/A not yet sent, will ensure this is sent immediately. Pt still in a lot of pain, fentanyl/ativan didn't help at all; will try IV robaxin 500mg  and IV dilaudid 1mg ; if unable to control pain then may need to admit for intractable pain. Of note, still tachycardic, likely related to pain, but will give another 1L bolus to ensure adequate hydration while she's here. Discussed case with my attending Dr. Venora Maples who agrees with plan. Will reassess shortly.   5:51 PM U/A with 5 ketones but otherwise no evidence of infection or other abnormal findings. Pt feeling just marginally better, but not significantly; still appears uncomfortable, can't straighten her leg out without significant pain, mildly tachycardic in the low 100s now, but overall not adequately pain controlled. I think the most reasonable next step would be to admit for pain control. Will give another dose of dilaudid now, and admit.   6:41 PM Dr. Roel Cluck of [facility] returning page and will admit. Holding orders to be placed by admitting team. Please see their notes for further documentation of care. I appreciate their help with this pleasant pt's care. Pt stable at time of admission.     Final Clinical Impressions(s) / ED Diagnoses   Final diagnoses:  Left hip pain  Intractable pain    ED Discharge Orders    [address], [place], [place] 06/11/17 Ward Chatters    Jola Schmidt, MD 06/12/17 1005

## 2017-06-11 NOTE — H&P (Signed)
Anita Whitehead CWC:[id number] DOB: 1955-11-10 DOA: 06/11/2017     PCP: [name], NP   Outpatient Specialists: [facility] orthopedics Patient coming from:   home Lives   With family    Chief Complaint: leg pain  HPI: Anita Whitehead is a 61 y.o. female with medical history significant of new pelvic mass HTN, HLD    Presented with severe pelvic pain and left hip pain and left upper thigh unable to straighten her leg secondary to severe pain pain has gotten aspirin days she attempted to use a heating pad but did not associated seizures in her left cough since diagnosis. Progress no fevers or chills no chest pain shortness of breath nausea vomiting diarrhea. No focal weakness or incontinence. Pelvic masses and most likely source of pain patient was admitted for the same on 25th of October  Through 28th. A she was discharged on Neurontin OxyContin 20 mg every 12h and Norco when necessary at first her pain was controlled but gradually became more severe  She has appointment of surgical oncology in [facility] in November 15 but trying to get an appointment at with Oncology in [place].  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 Reports 13lb weight loss in the past 2 months. She reports that her left leg started to feel weak.  Regarding pertinent Chronic problems: History of hypertension controlled with medicine   IN ER:  Temp (24hrs), Avg:97.9 F (36.6 C), Min:97.9 F (36.6 C), Max:97.9 F (36.6 C)      on arrival  ED Triage Vitals  Enc Vitals Group     BP 06/11/17 1254 129/89     Pulse Rate 06/11/17 1254 (!) 123     Resp 06/11/17 1254 (!) 22     Temp 06/11/17 1254 97.9 F (36.6 C)     Temp Source 06/11/17 1254 Oral     SpO2 06/11/17 1254 97 %     Weight 06/11/17 1256 118 lb (53.5 kg)     Height 06/11/17 1256 5\' 2"  (1.575 m)     Head Circumference --      Peak Flow --      Pain Score 06/11/17 1254 8     Pain Loc --      Pain  Edu? --      Excl. in [place]? --     Latest RR 18 [id number] 129/96 WBC 11.5Hg15.3 Plt226 Na 139 K 4.7Cr0.71 Following Medications were ordered in ER: Medications  sodium chloride 0.9 % bolus 1,000 mL (0 mLs Intravenous Stopped 06/11/17 1553)  fentaNYL (SUBLIMAZE) injection 50 mcg (50 mcg Intravenous Given 06/11/17 1505)  LORazepam (ATIVAN) injection 1 mg (1 mg Intravenous Given 06/11/17 1505)  HYDROmorphone (DILAUDID) injection 1 mg (1 mg Intravenous Given 06/11/17 1640)  sodium chloride 0.9 % bolus 1,000 mL (0 mLs Intravenous Stopped 06/11/17 1739)  methocarbamol (ROBAXIN) 500 mg in dextrose 5 % 50 mL IVPB (0 mg Intravenous Stopped 06/11/17 1739)  HYDROmorphone (DILAUDID) injection 1 mg (1 mg Intravenous Given 06/11/17 1817)     Hospitalist was called for admission for intractable pain secondary to cancer  Review of Systems:    Pertinent positives include: Left leg and thigh pain  Constitutional:  No weight loss, night sweats, Fevers, chills, fatigue, weight loss  HEENT:  No headaches, Difficulty swallowing,Tooth/dental problems,Sore throat,  No sneezing, itching, ear ache, nasal congestion, post nasal drip,  Cardio-vascular:  No chest pain, Orthopnea, PND, anasarca, dizziness, palpitations.no Bilateral lower extremity swelling  GI:  No heartburn, indigestion, abdominal pain, nausea, vomiting, diarrhea, change in bowel habits, loss of appetite, melena, blood in stool, hematemesis Resp:  no shortness of breath at rest. No dyspnea on exertion, No excess mucus, no productive cough, No non-productive cough, No coughing up of blood.No change in color of mucus.No wheezing. Skin:  no rash or lesions. No jaundice GU:  no dysuria, change in color of urine, no urgency or frequency. No straining to urinate.  No flank pain.  Musculoskeletal:  No joint pain or no joint swelling. No decreased range of motion. No back pain.  Psych:  No change in mood or affect. No depression or anxiety. No  memory loss.  Neuro: no localizing neurological complaints, no tingling, no weakness, no double vision, no gait abnormality, no slurred speech, no confusion  As per HPI otherwise 10 point review of systems negative.   Past Medical History: Past Medical History:  Diagnosis Date  . Allergy   . Cervical cancer ([place]) 1989  . History of colon polyps    TA   . Hyperlipidemia   . Hypertension   . PMS (premenstrual syndrome)    Past Surgical History:  Procedure Laterality Date  . ABDOMINAL HYSTERECTOMY    . adenomatous colon polyps    . COLONOSCOPY     04-01-2009,2006 with [facility]   . POLYPECTOMY       Social History:  Ambulatory   Kasandra Knudsen   reports that she has quit smoking. she has never used smokeless tobacco. She reports that she drinks alcohol. She reports that she does not use drugs.  Allergies:  No Known Allergies     Family History:   Family History  Problem Relation Age of Onset  . Diabetes Brother   . Hypertension Mother   . Cancer Mother        Unknown  . Hypertension Sister   . Cancer Sister        Breast, uterine  . Colon cancer Neg Hx   . Colon polyps Neg Hx   . Esophageal cancer Neg Hx   . Rectal cancer Neg Hx   . Stomach cancer Neg Hx     Medications: Prior to Admission medications   Medication Sig Start Date End Date Taking? Authorizing Provider  feeding supplement, ENSURE ENLIVE, (ENSURE ENLIVE) LIQD Take 237 mLs by mouth 2 (two) times daily between meals. 06/03/17  Yes Velvet Bathe, MD  gabapentin (NEURONTIN) 100 MG capsule Take 1 capsule (100 mg total) by mouth 3 (three) times daily. 06/03/17  Yes Velvet Bathe, MD  HYDROcodone-acetaminophen (NORCO/VICODIN) 5-325 MG tablet Take 1-2 tablets by mouth every 8 (eight) hours as needed for moderate pain. 06/03/17  Yes Velvet Bathe, MD  lisinopril (PRINIVIL,ZESTRIL) 20 MG tablet take 1 tablet by mouth once daily 12/05/16  Yes Nafziger, Tommi Rumps, NP  oxyCODONE (OXYCONTIN) 20 mg 12 hr tablet Take 1 tablet  (20 mg total) by mouth every 12 (twelve) hours. 06/03/17  Yes Velvet Bathe, MD    Physical Exam: Patient Vitals for the past 24 hrs:  BP Temp Temp src Pulse Resp SpO2 Height Weight  06/11/17 1835 (!) 129/96 - - (!) 116 18 100 % - -  06/11/17 1644 - - Oral - - - - -  06/11/17 1632 117/78 - - (!) 122 (!) 22 99 % - -  06/11/17 1256 - - - - - - 5\' 2"  (1.575 m) 53.5 kg (118 lb)  06/11/17 1254 129/89 97.9 F (36.6 C) Oral (!) 123 (!) 22  97 % - -    1. General:  in No Acute distress  well -appearing 2. Psychological: Alert and  Oriented 3. Head/ENT:   Moist   Mucous Membranes                          Head Non traumatic, neck supple                          Normal    Dentition 4. SKIN:  decreased Skin turgor,  Skin clean Dry and intact no rash 5. Heart: Regular rate and rhythm no  Murmur, no Rub or gallop 6. Lungs:  Clear to auscultation bilaterally, no wheezes or crackles   7. Abdomen: Soft,  non-tender, Non distended  bowel sounds present 8. Lower extremities: no clubbing, cyanosis, or edema 9. Neurologically   strength 5 out of 5 in all 4 extremities cranial nerves II through XII intact 10. MSK: Normal range of motion   body mass index is 21.58 kg/m.  Labs on Admission:   Labs on Admission: I have personally reviewed following labs and imaging studies  CBC: Recent Labs  Lab 06/11/17 1505  WBC 11.5*  NEUTROABS 8.7*  HGB 15.3*  HCT 44.0  MCV 91.5  PLT 993   Basic Metabolic Panel: Recent Labs  Lab 06/11/17 1505  NA 139  K 4.7  CL 98*  CO2 28  GLUCOSE 119*  BUN 17  CREATININE 0.71  CALCIUM 10.0   GFR: Estimated Creatinine Clearance: 60.6 mL/min (by C-G formula based on SCr of 0.71 mg/dL). Liver Function Tests: Recent Labs  Lab 06/11/17 1505  AST 34  ALT 72*  ALKPHOS 82  BILITOT 0.6  PROT 8.4*  ALBUMIN 4.4   No results for input(s): LIPASE, AMYLASE in the last 168 hours. No results for input(s): AMMONIA in the last 168 hours. Coagulation Profile: No  results for input(s): INR, PROTIME in the last 168 hours. Cardiac Enzymes: No results for input(s): CKTOTAL, CKMB, CKMBINDEX, TROPONINI in the last 168 hours. BNP (last 3 results) No results for input(s): PROBNP in the last 8760 hours. HbA1C: No results for input(s): HGBA1C in the last 72 hours. CBG: No results for input(s): GLUCAP in the last 168 hours. Lipid Profile: No results for input(s): CHOL, HDL, LDLCALC, TRIG, CHOLHDL, LDLDIRECT in the last 72 hours. Thyroid Function Tests: No results for input(s): TSH, T4TOTAL, FREET4, T3FREE, THYROIDAB in the last 72 hours. Anemia Panel: No results for input(s): VITAMINB12, FOLATE, FERRITIN, TIBC, IRON, RETICCTPCT in the last 72 hours. Urine analysis:    Component Value Date/Time   COLORURINE YELLOW 06/11/2017 [place] 06/11/2017 1506   LABSPEC 1.011 06/11/2017 1506   PHURINE 6.0 06/11/2017 1506   GLUCOSEU NEGATIVE 06/11/2017 1506   GLUCOSEU NEGATIVE 12/12/2010 0814   HGBUR NEGATIVE 06/11/2017 1506   HGBUR negative 11/30/2008 0906   BILIRUBINUR NEGATIVE 06/11/2017 1506   BILIRUBINUR 1+ 06/09/2016 1122   KETONESUR 5 (A) 06/11/2017 1506   PROTEINUR NEGATIVE 06/11/2017 1506   UROBILINOGEN 1.0 06/09/2016 1122   UROBILINOGEN 0.2 12/12/2010 0814   NITRITE NEGATIVE 06/11/2017 1506   LEUKOCYTESUR NEGATIVE 06/11/2017 1506   Sepsis Labs: @LABRCNTIP (procalcitonin:4,lacticidven:4) )No results found for this or any previous visit (from the past 240 hour(s)).     UA   no evidence of UTI     No results found for: HGBA1C  Estimated Creatinine Clearance: 60.6 mL/min (by C-G formula based  on SCr of 0.71 mg/dL).  BNP (last 3 results) No results for input(s): PROBNP in the last 8760 hours.   ECG REPORT Not obtained  Filed Weights   06/11/17 1256  Weight: 53.5 kg (118 lb)     Cultures: No results found for: SDES, SPECREQUEST, CULT, REPTSTATUS   Radiological Exams on Admission: No results found.  Chart has been  reviewed    Assessment/Plan  61 y.o. female with medical history significant of new pelvic mass HTN, HLD Admitted for uncontrollable pain secondary to cancer  Present on Admission: . Essential hypertension stable continue home medications for several . Hyperlipidemia stable continue home medications . Intractable pain - continue OxyContin and add on Dilaudid IV every 4 hours for severe pain and try oxycodone for moderate pain  increase Neurontin and add on ketorolac. Once pain was stable would adjust long lasting opioid  . Pelvic mass in female patient will need to have follow-up with oncology. Trying and establish follow-up currently with cancer Center . Cancer associated we'll titrate pain medication if unsuccessful discussed with oncology  to see what other modalities can be used.   Other plan as per orders.  DVT prophylaxis:   Lovenox     Code Status:  FULL CODE   as per patient    Family Communication:   Family  at  Bedside  plan of care was discussed with   Sister,   Disposition Plan:         To home once workup is complete and patient is stable                        Would benefit from PT/OT eval prior to DC   ordered                                                 Consults called: none    Admission status:    obs   Level of care    medical floor         I have spent a total of 56 min on this admission   Oniyah Rohe 06/11/2017, 8:59 PM    [facility]  Pager [phone]   after 2 AM please page floor coverage PA If 7AM-7PM, please contact the day team taking care of the patient  [email or web address]  Password [email or web address]

## 2017-06-12 DIAGNOSIS — G893 Neoplasm related pain (acute) (chronic): Secondary | ICD-10-CM | POA: Diagnosis not present

## 2017-06-12 DIAGNOSIS — R52 Pain, unspecified: Secondary | ICD-10-CM | POA: Diagnosis not present

## 2017-06-12 DIAGNOSIS — I10 Essential (primary) hypertension: Secondary | ICD-10-CM | POA: Diagnosis not present

## 2017-06-12 DIAGNOSIS — M25552 Pain in left hip: Secondary | ICD-10-CM | POA: Diagnosis not present

## 2017-06-12 LAB — COMPREHENSIVE METABOLIC PANEL
ALT: 46 U/L (ref 14–54)
AST: 22 U/L (ref 15–41)
Albumin: 3.1 g/dL — ABNORMAL LOW (ref 3.5–5.0)
Alkaline Phosphatase: 56 U/L (ref 38–126)
Anion gap: 8 (ref 5–15)
BUN: 11 mg/dL (ref 6–20)
CHLORIDE: 109 mmol/L (ref 101–111)
CO2: 24 mmol/L (ref 22–32)
CREATININE: 0.56 mg/dL (ref 0.44–1.00)
Calcium: 8.8 mg/dL — ABNORMAL LOW (ref 8.9–10.3)
GFR calc Af Amer: >60 mL/min (ref >60)
Glucose, Bld: 109 mg/dL — ABNORMAL HIGH (ref 65–99)
Potassium: 3.7 mmol/L (ref 3.5–5.1)
SODIUM: 141 mmol/L (ref 135–145)
Total Bilirubin: 0.5 mg/dL (ref 0.3–1.2)
Total Protein: 5.9 g/dL — ABNORMAL LOW (ref 6.5–8.1)

## 2017-06-12 LAB — CBC
HCT: 33.5 % — ABNORMAL LOW (ref 36.0–46.0)
Hemoglobin: 11.2 g/dL — ABNORMAL LOW (ref 12.0–15.0)
MCH: 30.8 pg (ref 26.0–34.0)
MCHC: 33.4 g/dL (ref 30.0–36.0)
MCV: 92 fL (ref 78.0–100.0)
PLATELETS: 183 10*3/uL (ref 150–400)
RBC: 3.64 MIL/uL — ABNORMAL LOW (ref 3.87–5.11)
RDW: 12.8 % (ref 11.5–15.5)
WBC: 7.8 10*3/uL (ref 4.0–10.5)

## 2017-06-12 LAB — PHOSPHORUS: Phosphorus: 3.3 mg/dL (ref 2.5–4.6)

## 2017-06-12 LAB — MAGNESIUM: Magnesium: 1.9 mg/dL (ref 1.7–2.4)

## 2017-06-12 LAB — TSH: TSH: 0.641 u[IU]/mL (ref 0.350–4.500)

## 2017-06-12 MED ORDER — OXYCODONE-ACETAMINOPHEN 5-325 MG PO TABS: 1.0000 | ORAL_TABLET | Freq: Four times a day (QID) | ORAL | 0 refills | Status: DC | PRN

## 2017-06-12 MED ORDER — CYCLOBENZAPRINE HCL 10 MG PO TABS: 10.0000 mg | ORAL_TABLET | Freq: Three times a day (TID) | ORAL | 0 refills | Status: DC | PRN

## 2017-06-12 MED ORDER — SENNA 8.6 MG PO TABS: 1.0000 | ORAL_TABLET | Freq: Two times a day (BID) | ORAL | 0 refills | Status: DC

## 2017-06-12 MED ORDER — DULOXETINE HCL 30 MG PO CPEP: 30.0000 mg | ORAL_CAPSULE | Freq: Every day | ORAL | 3 refills | Status: DC

## 2017-06-12 MED ORDER — CYCLOBENZAPRINE HCL 10 MG PO TABS
10.0000 mg | ORAL_TABLET | Freq: Three times a day (TID) | ORAL | Status: DC | PRN
  Administered 2017-06-12: 10 mg via ORAL
  Filled 2017-06-12: qty 1

## 2017-06-12 MED ORDER — GABAPENTIN 100 MG PO CAPS: 200.0000 mg | ORAL_CAPSULE | Freq: Three times a day (TID) | ORAL | 1 refills | Status: DC

## 2017-06-12 MED ORDER — OXYCODONE-ACETAMINOPHEN 5-325 MG PO TABS
1.0000 | ORAL_TABLET | Freq: Four times a day (QID) | ORAL | Status: DC | PRN
  Administered 2017-06-12: 2 via ORAL
  Filled 2017-06-12: qty 2

## 2017-06-12 MED ORDER — DULOXETINE HCL 30 MG PO CPEP
30.0000 mg | ORAL_CAPSULE | Freq: Every day | ORAL | Status: DC
  Administered 2017-06-12: 30 mg via ORAL
  Filled 2017-06-12: qty 1

## 2017-06-12 MED ORDER — METHOCARBAMOL 500 MG PO TABS
750.0000 mg | ORAL_TABLET | Freq: Three times a day (TID) | ORAL | Status: DC | PRN
  Administered 2017-06-12: 750 mg via ORAL
  Filled 2017-06-12: qty 2

## 2017-06-12 NOTE — Discharge Summary (Signed)
Physician Discharge Summary  Anita Whitehead ZOX:[id number] DOB: [birth year] DOA: 06/11/2017  PCP: [name], NP  Admit date: 06/11/2017 Discharge date: 06/12/2017  Time spent: 35* minutes  Recommendations for Outpatient Follow-up:  1. Follow up PCP in 2 weeks   Discharge Diagnoses:  Active Problems:   Hyperlipidemia   Essential hypertension   Intractable pain   Pelvic mass in female   Cancer associated pain   Discharge Condition: Stable  Diet recommendation: Regular diet  Filed Weights   06/11/17 1256 06/11/17 2212  Weight: 53.5 kg (118 lb) 54 kg (119 lb 0.8 oz)    History of present illness:  61 y.o. female with medical history significant of new pelvic mass HTN, HLD    Presented with severe pelvic pain and left hip pain and left upper thigh unable to straighten her leg secondary to severe pain pain has gotten aspirin days she attempted to use a heating pad but did not associated seizures in her left cough since diagnosis. Progress no fevers or chills no chest pain shortness of breath nausea vomiting diarrhea. No focal weakness or incontinence. Pelvic masses and most likely source of pain patient was admitted for the same on 25th of October  Through 28th. A she was discharged on Neurontin OxyContin 20 mg every 12h and Norco when necessary at first her pain was controlled but gradually became more severe  She has appointment of surgical oncology in [facility] in November 15 but trying to get an appointment at with Oncology in [place].  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    Hospital Course:   Cancer related pain- pain is fairly well controlled now. Will start Percocet 5-325 1-2 tabs po q 6 hr prn, continue Oxycontin 20 mg po q 12 hr, start Cymbalta 30 mg daily, Flexeril 10 mg po TID prn, will increase gabapentin to 200 mg po tid. Patient wioll follow up with PCP in 1-2 weeks  Hypertension- BP well controlled, continue  home medications.\  Pelvic mass- follow up Oncology as outpatient  Procedures: None   Consultations:  None   Discharge Exam: Vitals:   06/12/17 0530 06/12/17 1000  BP: 129/85 (!) 141/95  Pulse: (!) 114 100  Resp: 20   Temp: 99 F (37.2 C) 99 F (37.2 C)  SpO2: 100% 100%    General: Appears in no acute distress Cardiovascular: S1s2 RRR Respiratory:  Clear bilaterally  Discharge Instructions   Discharge Instructions    Diet - low sodium heart healthy   Complete by:  As directed    Increase activity slowly   Complete by:  As directed      Current Discharge Medication List    START taking these medications   Details  cyclobenzaprine (FLEXERIL) 10 MG tablet Take 1 tablet (10 mg total) 3 (three) times daily as needed by mouth for muscle spasms. Qty: 30 tablet, Refills: 0    DULoxetine (CYMBALTA) 30 MG capsule Take 1 capsule (30 mg total) daily by mouth. Qty: 30 capsule, Refills: 3    oxyCODONE-acetaminophen (PERCOCET/ROXICET) 5-325 MG tablet Take 1-2 tablets every 6 (six) hours as needed by mouth for severe pain. Qty: 30 tablet, Refills: 0    senna (SENOKOT) 8.6 MG TABS tablet Take 1 tablet (8.6 mg total) 2 (two) times daily by mouth. Qty: 120 each, Refills: 0      CONTINUE these medications which have CHANGED   Details  gabapentin (NEURONTIN) 100 MG capsule Take 2 capsules (200 mg total) 3 (  three) times daily by mouth. Qty: 90 capsule, Refills: 1      CONTINUE these medications which have NOT CHANGED   Details  feeding supplement, ENSURE ENLIVE, (ENSURE ENLIVE) LIQD Take 237 mLs by mouth 2 (two) times daily between meals. Qty: 237 mL, Refills: 12    lisinopril (PRINIVIL,ZESTRIL) 20 MG tablet take 1 tablet by mouth once daily Qty: 90 tablet, Refills: 3   Associated Diagnoses: Essential hypertension    oxyCODONE (OXYCONTIN) 20 mg 12 hr tablet Take 1 tablet (20 mg total) by mouth every 12 (twelve) hours. Qty: 14 tablet, Refills: 0      STOP taking  these medications     HYDROcodone-acetaminophen (NORCO/VICODIN) 5-325 MG tablet        No Known Allergies    The results of significant diagnostics from this hospitalization (including imaging, microbiology, ancillary and laboratory) are listed below for reference.    Significant Diagnostic Studies: Mr Lumbar Spine W Wo Contrast  Result Date: 05/31/2017 CLINICAL DATA:  Initial evaluation for acute severe left back and buttock pain. EXAM: MRI LUMBAR SPINE WITHOUT AND WITH CONTRAST MRI PELVIS WITHOUT AND WITH CONTRAST TECHNIQUE: Multiplanar and multiecho pulse sequences of the lumbar spine were obtained without and with intravenous contrast. CONTRAST:  87mL MULTIHANCE GADOBENATE DIMEGLUMINE 529 MG/ML IV SOLN COMPARISON:  Prior radiograph from earlier the same day. FINDINGS: MRI LUMBAR SPINE FINDINGS: Segmentation: Normal segmentation. Lowest well-formed disc labeled the L5-S1 level. Alignment: Normal alignment with preservation of the normal lumbar lordosis. No listhesis. Vertebrae: Vertebral body heights well maintained. No evidence for acute or chronic fracture. Bone marrow signal intensity within normal limits. Benign hemangioma noted within the L3 vertebral body. No worrisome osseous lesions. No abnormal marrow edema. Conus medullaris: Extends to the L1-2 level and appears normal. Paraspinal and other soft tissues: Large heterogeneous enhancing mass at the left pelvis, better evaluated on concomitant pelvic MRI. Left psoas muscles displaced anteriorly by the lesion. Paraspinous soft tissues otherwise within normal limits. T2 hyperintense subcentimeter cyst noted within the left kidney. Visualized visceral structures otherwise grossly unremarkable. Disc levels: L1-2:  Unremarkable. L2-3:  Unremarkable. L3-4:  Normal interspace.  Mild bilateral facet hypertrophy. L4-5: Diffuse disc bulge with disc desiccation. Superimposed small central disc protrusion, slightly eccentric to the left. Mild facet  ligamentum flavum hypertrophy. Resultant mild canal with moderate left lateral recess narrowing. Foramina are patent. L5-S1: Diffuse disc bulge with disc desiccation and intervertebral disc space narrowing. Superimposed central disc protrusion with slight inferior migration. Moderate facet arthrosis, greater on the left. Resultant mild canal with moderate left subarticular stenosis. Foramina are patent. MRI PELVIS FINDINGS: There is a large ovoid well-circumscribed mass measuring 9.6 x 6.3 x 11.9 cm positioned within the left hemipelvis. Lesion is isointense to muscle on precontrast T1 sequence, and demonstrates heterogeneous hyperintense T2 signal intensity, with heterogeneous post-contrast enhancement. Lesion lies just medial to the left iliac wing at the level of the left iliacus muscle, and may arise from the iliacus muscle itself. Left psoas muscle is displaced anteriorly and medially, and is draped over the mass as it courses inferiorly. Iliac vessels also displaced medially. No definite invasion of the underlying left iliac wing. No other mass lesion within the pelvis.  No adenopathy. Visualized bowels within normal limits. Bladder within normal limits. Uterus appears to be absent. Ovaries not discretely identified. No free fluid. Bone marrow signal intensity within normal limits. External soft tissues demonstrate no other acute abnormality. IMPRESSION: 1. 9.6 x 6.3 x 11.9 cm heterogeneous mass within  the left pelvis at the level of the left iliacus as above. Primary differential considerations consist of possible sarcomatous tumor, potentially arising from the left iliacus muscle. In neurogenic tumor could also be considered. Further evaluation with histologic sampling recommended, as this lesion would likely be amenable to percutaneous sampling. 2. Degenerative disc bulging and facet arthropathy at L4-5 and L5-S1 with resultant mild canal with moderate left subarticular stenosis. No frank neural impingement  within the lumbar spine. Electronically Signed   By: Jeannine Boga M.D.   On: 05/31/2017 21:21   Mr Pelvis W Wo Contrast  Result Date: 06/01/2017 CLINICAL DATA:  Left back and buttock pain. EXAM: MRI PELVIS WITHOUT AND WITH CONTRAST TECHNIQUE: Multiplanar multisequence MR imaging of the pelvis was performed both before and after administration of intravenous contrast. CONTRAST:  66mL MULTIHANCE GADOBENATE DIMEGLUMINE 529 MG/ML IV SOLN COMPARISON:  MRI lumbar spine 05/26/2017; radiographs 05/23/2017 FINDINGS: Urinary Tract:  Unremarkable Bowel:  Unremarkable Vascular/Lymphatic: The left-sided mass abuts and displaces the left internal and external iliac vessels medially. Reproductive: Uterus absent. No specific adnexal abnormality is identified. Other:  No supplemental non-categorized findings. Musculoskeletal: An 11.4 by 6.3 by 8.9 cm mass is present deep to the iliacus and lower psoas muscle on the left. Suspected early invasion of the left upper iliac bone with low-grade edema and enhancement in the marrow. Possible invasion of the upper left SI joint. The mass exerts mass effect on the left L4 and L5 spinal nerves and extends down along the iloipsoas anterior to the left new acetabulum. The mass has intermediate precontrast T1 signal and heterogeneous but primarily high a T2 signal, and demonstrates definite enhancement new especially along its margins new, likely with some central necrosis. IMPRESSION: 1. Large enhancing mass deep to the left iliacus and lower psoas muscles. Sarcoma is the top differential diagnostic consideration. Presumably an unusual neurofibroma or schwannoma might have a similar appearance, but is considered less likely. I am skeptical that this represents a large abscess related to left sacroiliac septic arthritis given the involvement of the only the upper margin of the SI joint rather than the entire SI joint, but correlation with any fever/leukocytosis is suggested. The mass  medially displaces the adjacent iliac vessels and demonstrates likely early invasion of the left upper iliac bone and possible invasion of the left upper SI joint. Electronically Signed   By: Van Clines M.D.   On: 06/01/2017 08:18   Ct Biopsy  Result Date: 06/02/2017 CLINICAL DATA:  Left pelvic mass EXAM: CT GUIDED CORE BIOPSY OF LEFT PELVIC MASS ANESTHESIA/SEDATION: Intravenous Fentanyl and Versed were administered as conscious sedation during continuous monitoring of the patient's level of consciousness and physiological / cardiorespiratory status by the radiology RN, with a total moderate sedation time of 10 minutes. PROCEDURE: The procedure risks, benefits, and alternatives were explained to the patient. Questions regarding the procedure were encouraged and answered. The patient understands and consents to the procedure. Select axial images through the pelvis were obtained. The left iliac fossa mass was localized and appropriate skin entry site was determined and marked. The operative field was prepped with Betadine/chlorhexidinein a sterile fashion, and a sterile drape was applied covering the operative field. A sterile gown and sterile gloves were used for the procedure. Local anesthesia was provided with 1% Lidocaine. Under CT fluoroscopic guidance, a 17 gauge trocar needle was advanced to the margin of the lesion. Once needle tip position was confirmed, coaxial 18-gauge core biopsy samples were obtained, submitted in formalin to  surgical pathology. The guide needle was removed. Postprocedure scans show no hemorrhage or other apparent complication. The patient tolerated the procedure well. COMPLICATIONS: None immediate FINDINGS: Left iliac fossa mass was localized. Representative core biopsy samples obtained as above. IMPRESSION: 1. Technically successful CT-guided core biopsy, left pelvic mass. Electronically Signed   By: Lucrezia Europe M.D.   On: 06/02/2017 08:31   Xr Hip Unilat W Or W/o Pelvis  1v Left  Result Date: 05/23/2017 AP pelvis and lateral view left hip: No acute fracture. Left hips well located. Left hip joints well preserved.   Microbiology: No results found for this or any previous visit (from the past 240 hour(s)).   Labs: Basic Metabolic Panel: Recent Labs  Lab 06/11/17 1505 06/12/17 0438  NA 139 141  K 4.7 3.7  CL 98* 109  CO2 28 24  GLUCOSE 119* 109*  BUN 17 11  CREATININE 0.71 0.56  CALCIUM 10.0 8.8*  MG  --  1.9  PHOS  --  3.3   Liver Function Tests: Recent Labs  Lab 06/11/17 1505 06/12/17 0438  AST 34 22  ALT 72* 46  ALKPHOS 82 56  BILITOT 0.6 0.5  PROT 8.4* 5.9*  ALBUMIN 4.4 3.1*   No results for input(s): LIPASE, AMYLASE in the last 168 hours. No results for input(s): AMMONIA in the last 168 hours. CBC: Recent Labs  Lab 06/11/17 1505 06/12/17 0438  WBC 11.5* 7.8  NEUTROABS 8.7*  --   HGB 15.3* 11.2*  HCT 44.0 33.5*  MCV 91.5 92.0  PLT 226 183        Signed:  Teaghan Formica S MD.  [facility] 06/12/2017, 11:47 AM

## 2017-06-12 NOTE — Evaluation (Signed)
Physical Therapy Evaluation Patient Details Name: Anita Whitehead MRN: [id number] DOB: 03-17-1956 Today's Date: 06/12/2017   History of Present Illness  61 y.o. female with medical history significant of new pelvic mass HTN, HLD Admitted for uncontrollable pain secondary to cancer  Clinical Impression  Pt had been ambulating with a straight cane prior to admission. She would like to use a RW for more support. Instructed pt in correct use of RW, she ambulated 76' without loss of balance, distance limited by L anterior hip pain. She is ready to DC home from PT standpoint. RW recommended.     Follow Up Recommendations No PT follow up    Equipment Recommendations  Rolling walker with 5" wheels    Recommendations for Other Services       Precautions / Restrictions Precautions Precautions: Fall Precaution Comments: pt denies h/o falls in past 1 year Restrictions Weight Bearing Restrictions: No      Mobility  Bed Mobility Overal bed mobility: Modified Independent             General bed mobility comments: uses arms to lift LLE into bed  Transfers Overall transfer level: Independent                  Ambulation/Gait Ambulation/Gait assistance: Supervision Ambulation Distance (Feet): 28 Feet Assistive device: Rolling walker (2 wheeled) Gait Pattern/deviations: Step-through pattern;Antalgic;Decreased dorsiflexion - left Gait velocity: decr Gait velocity interpretation: Below normal speed for age/gender General Gait Details: pain with WB LLE, steady with RW, no LOB, VCs for sequencing  Stairs            Wheelchair Mobility    Modified Rankin (Stroke Patients Only)       Balance Overall balance assessment: Modified Independent                                           Pertinent Vitals/Pain Pain Assessment: 0-10 Pain Score: 5  Pain Location: L anterior hip with walking Pain Descriptors / Indicators: Sore Pain Intervention(s):  Limited activity within patient's tolerance;Premedicated before session;Monitored during session    Home Living Family/patient expects to be discharged to:: Private residence Living Arrangements: Spouse/significant other Available Help at Discharge: Family;Available 24 hours/day Type of Home: House Home Access: Level entry     Home Layout: Two level;Able to live on main level with bedroom/bathroom Home Equipment: Cane - single point      Prior Function Level of Independence: Independent with assistive device(s)         Comments: was walking with SPC prior to admission, independent bathing/dressing, no falls      Hand Dominance        Extremity/Trunk Assessment   Upper Extremity Assessment Upper Extremity Assessment: Overall WFL for tasks assessed    Lower Extremity Assessment Lower Extremity Assessment: LLE deficits/detail LLE Deficits / Details: knee ext +2/5, ankle DF/PF +4/5, hip flexion -2/5, tingling anterior/posterior calf    Cervical / Trunk Assessment Cervical / Trunk Assessment: Normal  Communication   Communication: No difficulties  Cognition Arousal/Alertness: Awake/alert Behavior During Therapy: WFL for tasks assessed/performed Overall Cognitive Status: Within Functional Limits for tasks assessed                                        General Comments  Exercises     Assessment/Plan    PT Assessment Patent does not need any further PT services  PT Problem List         PT Treatment Interventions      PT Goals (Current goals can be found in the Care Plan section)  Acute Rehab PT Goals PT Goal Formulation: All assessment and education complete, DC therapy    Frequency     Barriers to discharge        Co-evaluation               AM-PAC PT "6 Clicks" Daily Activity  Outcome Measure Difficulty turning over in bed (including adjusting bedclothes, sheets and blankets)?: None Difficulty moving from lying on back  to sitting on the side of the bed? : A Little Difficulty sitting down on and standing up from a chair with arms (e.g., wheelchair, bedside commode, etc,.)?: None Help needed moving to and from a bed to chair (including a wheelchair)?: None Help needed walking in hospital room?: A Little Help needed climbing 3-5 steps with a railing? : A Lot 6 Click Score: 20    End of Session Equipment Utilized During Treatment: Gait belt Activity Tolerance: Patient limited by pain Patient left: in bed;with call bell/phone within reach;with family/visitor present Nurse Communication: Mobility status      Time: 1914-7829 PT Time Calculation (min) (ACUTE ONLY): 13 min   Charges:   PT Evaluation $PT Eval Low Complexity: 1 Low     PT G Codes:   PT G-Codes **NOT FOR INPATIENT CLASS** Functional Assessment Tool Used: AM-PAC 6 Clicks Basic Mobility Functional Limitation: Mobility: Walking and moving around Mobility: Walking and Moving Around Current Status (F6213): At least 20 percent but less than 40 percent impaired, limited or restricted Mobility: Walking and Moving Around Goal Status [phone]): At least 20 percent but less than 40 percent impaired, limited or restricted     Philomena Doheny 06/12/2017, 1:33 PM [phone]

## 2017-06-12 NOTE — Progress Notes (Signed)
OT Cancellation Note  Patient Details Name: Anita Whitehead MRN: [id number] DOB: Feb 28, 1956   Cancelled Treatment:    Reason Eval/Treat Not Completed: OT screened, no needs identified, will sign off.  Checked with pt:  She hasn't been having any trouble getting on/off toilet and has walk in shower with seat. She also reports she has been able to care for herself.   No immediate OT needs.  Marylon Verno 06/12/2017, 3:21 PM  Lesle Chris, OTR/L [phone] 06/12/2017

## 2017-06-12 NOTE — Progress Notes (Signed)
Pt discharged home with sister in stable condition. Discharge instructions given. Scripts given to husband by MD. Pt verbalized understanding. No immediate questions or concerns at this time.

## 2017-06-14 ENCOUNTER — Ambulatory Visit (INDEPENDENT_AMBULATORY_CARE_PROVIDER_SITE_OTHER): Payer: 59 | Admitting: Surgery

## 2017-06-15 ENCOUNTER — Ambulatory Visit (HOSPITAL_BASED_OUTPATIENT_CLINIC_OR_DEPARTMENT_OTHER): Payer: 59 | Admitting: Hematology and Oncology

## 2017-06-15 ENCOUNTER — Encounter: Payer: Self-pay | Admitting: Hematology and Oncology

## 2017-06-15 ENCOUNTER — Ambulatory Visit: Payer: 59 | Admitting: Hematology and Oncology

## 2017-06-15 DIAGNOSIS — G893 Neoplasm related pain (acute) (chronic): Secondary | ICD-10-CM | POA: Diagnosis not present

## 2017-06-15 DIAGNOSIS — R630 Anorexia: Secondary | ICD-10-CM | POA: Diagnosis not present

## 2017-06-15 DIAGNOSIS — R634 Abnormal weight loss: Secondary | ICD-10-CM

## 2017-06-15 DIAGNOSIS — C414 Malignant neoplasm of pelvic bones, sacrum and coccyx: Secondary | ICD-10-CM

## 2017-06-15 DIAGNOSIS — Z923 Personal history of irradiation: Secondary | ICD-10-CM

## 2017-06-15 DIAGNOSIS — R64 Cachexia: Secondary | ICD-10-CM | POA: Diagnosis not present

## 2017-06-15 DIAGNOSIS — C480 Malignant neoplasm of retroperitoneum: Secondary | ICD-10-CM | POA: Insufficient documentation

## 2017-06-15 DIAGNOSIS — Z8541 Personal history of malignant neoplasm of cervix uteri: Secondary | ICD-10-CM

## 2017-06-15 MED ORDER — DEXAMETHASONE 4 MG PO TABS: 4.0000 mg | ORAL_TABLET | Freq: Every day | ORAL | 1 refills | Status: DC

## 2017-06-15 NOTE — Progress Notes (Signed)
[facility] CONSULT NOTE  Patient Care Team: [name], NP as PCP - General (Family Medicine)  CHIEF COMPLAINTS/PURPOSE OF CONSULTATION:  Newly diagnosed sarcoma of the pelvis  HISTORY OF PRESENTING ILLNESS:  Anita Whitehead 61 y.o. female is here because of recent diagnosis of sarcoma of the pelvis. 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  She had interesting history of cervical cancer, diagnosed around 1989 hysterectomy and adjuvant radiation therapy. Unfortunately, I do not have her old records. According to the patient, she had what sounds like external beam radiation therapy and brachii therapy.  She did not receive chemotherapy. She had routine follow-up and had negative Pap smears since.  Starting around 3 months ago, she started to experience left lower back/left hip pain radiating down to her left leg. Her pain got progressively worse. At worst, she rated her pain at 8 out of 10.   She was subsequently prescribed narcotic prescription with Norco/Percocet.   When she takes the pain medicine pill, it usually take approximately 30 minutes to work and it would bring her pain level down to about 2 out of 10. At this moment in time, she rated her pain 4 out of 10 The pain would wake her up in the middle of the night or in the morning.  It is worse when she tries to walk. She has started to use a cane because she has started to put more weight on her right leg.  She have some mild balance difficulties but denies recent falls. In bed, she have the lie on her right side or put a pillow under the left leg.  She have difficulties straightening her left leg. She was originally seen by her primary care doctor.  Initially, it was thought that her pain was due to statin that was subsequently discontinued  but it made no impact to her pain. Subsequently, she self referred to see an orthopedic surgeon who initially thought it could be due to bursitis.  She received an injection in the area that did not help. Subsequently, he ordered imaging study and MRI. 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 9.6 x 6.3 x 11.9 cm heterogeneous mass within the left pelvis at the level of the left iliacus as above. Primary differential considerations consist of possible sarcomatous tumor, potentially arising from the left iliacus muscle. In neurogenic tumor could also be considered. Further evaluation with histologic sampling recommended, as this lesion would likely be amenable to percutaneous sampling. 2. Degenerative disc bulging and facet arthropathy at L4-5 and L5-S1 with resultant mild canal with moderate left subarticular stenosis. No frank neural impingement within the lumbar spine      05/31/2017 - 06/12/2017 Hospital Admission    She had recurrent admission to the hospital due to severe pain.  She underwent CT-guided biopsy and pain management      06/01/2017 Procedure    Technically successful CT-guided core biopsy, left pelvic mass      06/01/2017 Pathology Results    Soft tissue mass, biopsy, Left Pelvic Mass - MALIGNANT SPINDLE CELL PROLIFERATION. - SEE COMMENT. Microscopic Comment The biopsy fragments reveal an atypical spindle cell proliferation. The lesion shows increased cellularity  around large caliber blood vessels. There is moderate cytologic atypia as well as mitoses. The tumor cells are positive for CD34, desmin, S100, and focally positive for p63. They are negative for smooth muscle myosin, smooth muscle actin, CD99, CD31, CD117, calponin, EMA, cytokeratin 8/18 and cytokeratin AE1/AE3. The immunohistochemical profile is non-specific; however, the differential diagnosis includes a  sarcoma such as a malignant peripheral nerve sheath tumor. Dr. Claudette Laws has reviewed the case and is in essential agreement with this interpretation. (JBK:ah 10/30-31/18)      The patient have lost 10 pounds of weight due to anorexia. She denies changes in her bowel habits. In addition to the pain and weakness, she also complains some paresthesia especially in her lower left leg. She denies recent cough, chest pain or shortness of breath No abnormal vaginal discharge. The patient is very nervous today. Interestingly, she has strong family history of ovarian cancer and breast cancer.  MEDICAL HISTORY:  Past Medical History:  Diagnosis Date  . Allergy   . Cervical cancer ([place]) 1989  . History of colon polyps    TA   . Hyperlipidemia   . Hypertension   . PMS (premenstrual syndrome)     SURGICAL HISTORY: Past Surgical History:  Procedure Laterality Date  . ABDOMINAL HYSTERECTOMY    . adenomatous colon polyps    . COLONOSCOPY     04-01-2009,2006 with [facility]   . POLYPECTOMY      SOCIAL HISTORY: Social History   Socioeconomic History  . Marital status: Married    Spouse name: Anita Whitehead  . Number of children: 1  . Years of education: Not on file  . Highest education level: Not on file  Social Needs  . Financial resource strain: Not on file  . Food insecurity - worry: Not on file  . Food insecurity - inability: Not on file  . Transportation needs - medical: Not on file  . Transportation needs - non-medical: Not on file  Occupational History  . Occupation: Education officer, environmental  Tobacco Use  . Smoking status: Former Research scientist (life sciences)  . Smokeless tobacco: Never Used  . Tobacco comment: hooka  Substance and Sexual Activity  . Alcohol use: Yes    Alcohol/week: 0.0 oz    Comment: 3 beers a week and 2 cocktails during the week- 1 drink a day. Last drink: About a month ago.   . Drug use: No  . Sexual activity: Not on file  Other Topics Concern  . Not on file  Social History  Narrative   Civil engineer, contracting for [employer]   Married for 16 years   2 boys, one in [place] and one in [place]: Family History  Problem Relation Age of Onset  . Diabetes Brother   . Hypertension Mother   . Ovarian cancer Mother 61  . Hypertension Sister   . Breast cancer Sister 33  . Lung cancer Father 38  . Colon cancer Neg Hx   . Colon polyps Neg Hx   . Esophageal cancer Neg Hx   . Rectal cancer Neg Hx   . Stomach cancer Neg Hx     ALLERGIES:  has No Known Allergies.  MEDICATIONS:  Current Outpatient Medications  Medication Sig Dispense Refill  . cyclobenzaprine (FLEXERIL) 10 MG tablet Take 1 tablet (10 mg total) 3 (three) times daily as needed by mouth for muscle spasms. 30 tablet 0  . DULoxetine (CYMBALTA) 30 MG capsule Take 1 capsule (30 mg total) daily  by mouth. 30 capsule 3  . feeding supplement, ENSURE ENLIVE, (ENSURE ENLIVE) LIQD Take 237 mLs by mouth 2 (two) times daily between meals. 237 mL 12  . gabapentin (NEURONTIN) 100 MG capsule Take 2 capsules (200 mg total) 3 (three) times daily by mouth. 90 capsule 1  . lisinopril (PRINIVIL,ZESTRIL) 20 MG tablet take 1 tablet by mouth once daily 90 tablet 3  . oxyCODONE-acetaminophen (PERCOCET/ROXICET) 5-325 MG tablet Take 1-2 tablets every 6 (six) hours as needed by mouth for severe pain. 30 tablet 0  . senna (SENOKOT) 8.6 MG TABS tablet Take 1 tablet (8.6 mg total) 2 (two) times daily by mouth. 120 each 0  . dexamethasone (DECADRON) 4 MG tablet Take 1 tablet (4 mg total) daily by mouth. Take with food 30 tablet 1  . HYDROcodone-acetaminophen (NORCO/VICODIN) 5-325 MG tablet Take 1 tablet every 6 (six) hours as needed by mouth.  0   No current facility-administered medications for this visit.     REVIEW OF SYSTEMS:   Constitutional: Denies fevers, chills or abnormal night sweats Eyes: Denies blurriness of vision, double vision or watery eyes Ears, nose, mouth, throat, and face:  Denies mucositis or sore throat Respiratory: Denies cough, dyspnea or wheezes Cardiovascular: Denies palpitation, chest discomfort or lower extremity swelling Gastrointestinal:  Denies nausea, heartburn or change in bowel habits Skin: Denies abnormal skin rashes Lymphatics: Denies new lymphadenopathy or easy bruising Behavioral/Psych: Mood is stable, no new changes  All other systems were reviewed with the patient and are negative.  PHYSICAL EXAMINATION: ECOG PERFORMANCE STATUS: 2 - Symptomatic, <50% confined to bed  Vitals:   06/15/17 1356  BP: 125/89  Pulse: (!) 142  Resp: (!) 22  Temp: 98 F (36.7 C)  SpO2: 99%   Filed Weights   06/15/17 1356  Weight: 118 lb 1 oz (53.6 kg)    GENERAL:alert, no distress and comfortable.  She looks nervous.  She is thin and mildly cachectic SKIN: skin color, texture, turgor are normal, no rashes or significant lesions EYES: normal, conjunctiva are pink and non-injected, sclera clear OROPHARYNX:no exudate, no erythema and lips, buccal mucosa, and tongue normal  NECK: supple, thyroid normal size, non-tender, without nodularity LYMPH:  no palpable lymphadenopathy in the cervical, axillary or inguinal LUNGS: clear to auscultation and percussion with normal breathing effort HEART: regular rate & rhythm and no murmurs and no lower extremity edema ABDOMEN:abdomen soft, non-tender and normal bowel sounds Musculoskeletal:no cyanosis of digits and no clubbing.  She have difficulty straightening her left leg PSYCH: alert & oriented x 3 with fluent speech NEURO: Difficult to examine due to pain.  Her gait is unstable despite using cane  LABORATORY DATA:  I have reviewed the data as listed Lab Results  Component Value Date   WBC 7.8 06/12/2017   HGB 11.2 (L) 06/12/2017   HCT 33.5 (L) 06/12/2017   MCV 92.0 06/12/2017   PLT 183 06/12/2017   Recent Labs    05/31/17 2316 06/01/17 0114 06/11/17 1505 06/12/17 0438  NA 135 135 139 141  K 4.3 4.1  4.7 3.7  CL 102 102 98* 109  CO2 '22 23 28 24  '$ GLUCOSE 100* 102* 119* 109*  BUN '10 10 17 11  '$ CREATININE 0.73 0.69 0.71 0.56  CALCIUM 9.6 9.7 10.0 8.8*  GFRNONAA >60 >60 >60 >60  GFRAA >60 >60 >60 >60  PROT 7.1  --  8.4* 5.9*  ALBUMIN 4.3  --  4.4 3.1*  AST 18  --  34 22  ALT 19  --  72* 46  ALKPHOS 66  --  82 56  BILITOT 1.3*  --  0.6 0.5    RADIOGRAPHIC STUDIES: I have reviewed imaging study with patient and family. I have personally reviewed the radiological images as listed and agreed with the findings in the report. Mr Lumbar Spine W Wo Contrast  Result Date: 05/31/2017 CLINICAL DATA:  Initial evaluation for acute severe left back and buttock pain. EXAM: MRI LUMBAR SPINE WITHOUT AND WITH CONTRAST MRI PELVIS WITHOUT AND WITH CONTRAST TECHNIQUE: Multiplanar and multiecho pulse sequences of the lumbar spine were obtained without and with intravenous contrast. CONTRAST:  2m MULTIHANCE GADOBENATE DIMEGLUMINE 529 MG/ML IV SOLN COMPARISON:  Prior radiograph from earlier the same day. FINDINGS: MRI LUMBAR SPINE FINDINGS: Segmentation: Normal segmentation. Lowest well-formed disc labeled the L5-S1 level. Alignment: Normal alignment with preservation of the normal lumbar lordosis. No listhesis. Vertebrae: Vertebral body heights well maintained. No evidence for acute or chronic fracture. Bone marrow signal intensity within normal limits. Benign hemangioma noted within the L3 vertebral body. No worrisome osseous lesions. No abnormal marrow edema. Conus medullaris: Extends to the L1-2 level and appears normal. Paraspinal and other soft tissues: Large heterogeneous enhancing mass at the left pelvis, better evaluated on concomitant pelvic MRI. Left psoas muscles displaced anteriorly by the lesion. Paraspinous soft tissues otherwise within normal limits. T2 hyperintense subcentimeter cyst noted within the left kidney. Visualized visceral structures otherwise grossly unremarkable. Disc levels: L1-2:   Unremarkable. L2-3:  Unremarkable. L3-4:  Normal interspace.  Mild bilateral facet hypertrophy. L4-5: Diffuse disc bulge with disc desiccation. Superimposed small central disc protrusion, slightly eccentric to the left. Mild facet ligamentum flavum hypertrophy. Resultant mild canal with moderate left lateral recess narrowing. Foramina are patent. L5-S1: Diffuse disc bulge with disc desiccation and intervertebral disc space narrowing. Superimposed central disc protrusion with slight inferior migration. Moderate facet arthrosis, greater on the left. Resultant mild canal with moderate left subarticular stenosis. Foramina are patent. MRI PELVIS FINDINGS: There is a large ovoid well-circumscribed mass measuring 9.6 x 6.3 x 11.9 cm positioned within the left hemipelvis. Lesion is isointense to muscle on precontrast T1 sequence, and demonstrates heterogeneous hyperintense T2 signal intensity, with heterogeneous post-contrast enhancement. Lesion lies just medial to the left iliac wing at the level of the left iliacus muscle, and may arise from the iliacus muscle itself. Left psoas muscle is displaced anteriorly and medially, and is draped over the mass as it courses inferiorly. Iliac vessels also displaced medially. No definite invasion of the underlying left iliac wing. No other mass lesion within the pelvis.  No adenopathy. Visualized bowels within normal limits. Bladder within normal limits. Uterus appears to be absent. Ovaries not discretely identified. No free fluid. Bone marrow signal intensity within normal limits. External soft tissues demonstrate no other acute abnormality. IMPRESSION: 1. 9.6 x 6.3 x 11.9 cm heterogeneous mass within the left pelvis at the level of the left iliacus as above. Primary differential considerations consist of possible sarcomatous tumor, potentially arising from the left iliacus muscle. In neurogenic tumor could also be considered. Further evaluation with histologic sampling recommended,  as this lesion would likely be amenable to percutaneous sampling. 2. Degenerative disc bulging and facet arthropathy at L4-5 and L5-S1 with resultant mild canal with moderate left subarticular stenosis. No frank neural impingement within the lumbar spine. Electronically Signed   By: BJeannine BogaM.D.   On: 05/31/2017 21:21   Mr Pelvis W Wo Contrast  Result Date: 06/01/2017 CLINICAL  DATA:  Left back and buttock pain. EXAM: MRI PELVIS WITHOUT AND WITH CONTRAST TECHNIQUE: Multiplanar multisequence MR imaging of the pelvis was performed both before and after administration of intravenous contrast. CONTRAST:  32m MULTIHANCE GADOBENATE DIMEGLUMINE 529 MG/ML IV SOLN COMPARISON:  MRI lumbar spine 05/26/2017; radiographs 05/23/2017 FINDINGS: Urinary Tract:  Unremarkable Bowel:  Unremarkable Vascular/Lymphatic: The left-sided mass abuts and displaces the left internal and external iliac vessels medially. Reproductive: Uterus absent. No specific adnexal abnormality is identified. Other:  No supplemental non-categorized findings. Musculoskeletal: An 11.4 by 6.3 by 8.9 cm mass is present deep to the iliacus and lower psoas muscle on the left. Suspected early invasion of the left upper iliac bone with low-grade edema and enhancement in the marrow. Possible invasion of the upper left SI joint. The mass exerts mass effect on the left L4 and L5 spinal nerves and extends down along the iloipsoas anterior to the left new acetabulum. The mass has intermediate precontrast T1 signal and heterogeneous but primarily high a T2 signal, and demonstrates definite enhancement new especially along its margins new, likely with some central necrosis. IMPRESSION: 1. Large enhancing mass deep to the left iliacus and lower psoas muscles. Sarcoma is the top differential diagnostic consideration. Presumably an unusual neurofibroma or schwannoma might have a similar appearance, but is considered less likely. I am skeptical that this  represents a large abscess related to left sacroiliac septic arthritis given the involvement of the only the upper margin of the SI joint rather than the entire SI joint, but correlation with any fever/leukocytosis is suggested. The mass medially displaces the adjacent iliac vessels and demonstrates likely early invasion of the left upper iliac bone and possible invasion of the left upper SI joint. Electronically Signed   By: WVan ClinesM.D.   On: 06/01/2017 08:18   Ct Biopsy  Result Date: 06/02/2017 CLINICAL DATA:  Left pelvic mass EXAM: CT GUIDED CORE BIOPSY OF LEFT PELVIC MASS ANESTHESIA/SEDATION: Intravenous Fentanyl and Versed were administered as conscious sedation during continuous monitoring of the patient's level of consciousness and physiological / cardiorespiratory status by the radiology RN, with a total moderate sedation time of 10 minutes. PROCEDURE: The procedure risks, benefits, and alternatives were explained to the patient. Questions regarding the procedure were encouraged and answered. The patient understands and consents to the procedure. Select axial images through the pelvis were obtained. The left iliac fossa mass was localized and appropriate skin entry site was determined and marked. The operative field was prepped with Betadine/chlorhexidinein a sterile fashion, and a sterile drape was applied covering the operative field. A sterile gown and sterile gloves were used for the procedure. Local anesthesia was provided with 1% Lidocaine. Under CT fluoroscopic guidance, a 17 gauge trocar needle was advanced to the margin of the lesion. Once needle tip position was confirmed, coaxial 18-gauge core biopsy samples were obtained, submitted in formalin to surgical pathology. The guide needle was removed. Postprocedure scans show no hemorrhage or other apparent complication. The patient tolerated the procedure well. COMPLICATIONS: None immediate FINDINGS: Left iliac fossa mass was  localized. Representative core biopsy samples obtained as above. IMPRESSION: 1. Technically successful CT-guided core biopsy, left pelvic mass. Electronically Signed   By: DLucrezia EuropeM.D.   On: 06/02/2017 08:31   Xr Hip Unilat W Or W/o Pelvis 1v Left  Result Date: 05/23/2017 AP pelvis and lateral view left hip: No acute fracture. Left hips well located. Left hip joints well preserved.  I have reviewed the report with  the patient and family. 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  She also have positive family history of ovarian cancer and breast cancer. I suspect the sarcoma is likely related to prior radiation exposure rather than de novo new sarcoma diagnosis The patient would benefit from referral to genetic counselor in the near future She needs surgical evaluation first to see if primary resection is possible It is not clear to me who should be involved in her care I have reached out to our neuro-oncologist who will present her case at the next neuro surgical conference on Monday If appropriate, I will refer her to see neurosurgeon and neuro oncologist for evaluation  I will also reach out to the GYN oncologist to see if primary resection is possible Given the size of the tumor, the general approach would be either primary surgical resection followed by adjuvant treatment versus neoadjuvant chemotherapy followed by surgical resection She would need further staging scans in the near future to complete staging I have not ordered it, pending further evaluation by either surgical specialties first In the meantime, I will manage her pain I advised the patient not to return back to work due to severity of her cancer diagnosis  Cancer associated pain She has poorly controlled pain I suspect there might be an inflammatory component to her pain I recommend  low-dose dexamethasone daily along with Percocet and Cymbalta I will reassess her pain control in her next visit We discussed narcotic refill policy  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 The patient knows to call the clinic with any problems, questions or concerns. I spent 60 minutes counseling the patient face to face. The total time spent in the appointment was 80 minutes and more than 50% was on counseling.     [name], MD 06/15/2017 4:12 PM

## 2017-06-15 NOTE — Assessment & Plan Note (Signed)
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

## 2017-06-15 NOTE — Assessment & Plan Note (Addendum)
The patient had prior history of cervical cancer status post radiation treatment She now presented with evidence of sarcoma.  She also have positive family history of ovarian cancer and breast cancer. I suspect the sarcoma is likely related to prior radiation exposure rather than de novo new sarcoma diagnosis The patient would benefit from referral to genetic counselor in the near future She needs surgical evaluation first to see if primary resection is possible It is not clear to me who should be involved in her care I have reached out to our neuro-oncologist who will present her case at the next neuro surgical conference on Monday If appropriate, I will refer her to see neurosurgeon and neuro oncologist for evaluation  I will also reach out to the GYN oncologist to see if primary resection is possible Given the size of the tumor, the general approach would be either primary surgical resection followed by adjuvant treatment versus neoadjuvant chemotherapy followed by surgical resection She would need further staging scans in the near future to complete staging I have not ordered it, pending further evaluation by either surgical specialties first In the meantime, I will manage her pain I advised the patient not to return back to work due to severity of her cancer diagnosis

## 2017-06-15 NOTE — Assessment & Plan Note (Signed)
She has poorly controlled pain I suspect there might be an inflammatory component to her pain I recommend low-dose dexamethasone daily along with Percocet and Cymbalta I will reassess her pain control in her next visit We discussed narcotic refill policy

## 2017-06-18 ENCOUNTER — Other Ambulatory Visit: Payer: Self-pay | Admitting: Hematology and Oncology

## 2017-06-18 ENCOUNTER — Telehealth: Payer: Self-pay | Admitting: Hematology and Oncology

## 2017-06-18 ENCOUNTER — Encounter: Payer: Self-pay | Admitting: Adult Health

## 2017-06-18 ENCOUNTER — Encounter: Payer: Self-pay | Admitting: Hematology and Oncology

## 2017-06-18 MED ORDER — OXYCODONE-ACETAMINOPHEN 5-325 MG PO TABS: 1.0000 | ORAL_TABLET | ORAL | 0 refills | Status: DC | PRN

## 2017-06-18 NOTE — Telephone Encounter (Signed)
I have discussed the case with general surgeon at [facility], alerted him about recommendation from [place]

## 2017-06-18 NOTE — Telephone Encounter (Signed)
I have reviewed the case with the neurooncologist. Due to the complexity of the situation, a multidisciplinary approach involving multiple surgical specialties are needed for this patient. The patient has a referral pending to see general surgery at [facility] I would reach out to the surgeon today I recommend she keeps the appointment I plan to increase her steroid to twice a day and I will refill her prescription pain medicine as discussed The patient have no further questions

## 2017-06-19 ENCOUNTER — Telehealth (INDEPENDENT_AMBULATORY_CARE_PROVIDER_SITE_OTHER): Payer: Self-pay | Admitting: Surgery

## 2017-06-19 ENCOUNTER — Telehealth (INDEPENDENT_AMBULATORY_CARE_PROVIDER_SITE_OTHER): Payer: Self-pay | Admitting: Radiology

## 2017-06-19 NOTE — Telephone Encounter (Signed)
Records faxed to [place]

## 2017-06-19 NOTE — Telephone Encounter (Signed)
I called patient at Anita Whitehead request to check on her and see how she is doing. She advised that she is doing well, has seen some other physicians, and will be going to [facility] tomorrow for the appt that [name] had made for her. She wanted to schedule follow up appt with [name] so that she could follow up with him on what is discussed at her appt tomorrow. Follow up appt made with [name] on 06/27/2017.

## 2017-06-22 ENCOUNTER — Ambulatory Visit (INDEPENDENT_AMBULATORY_CARE_PROVIDER_SITE_OTHER): Payer: 59 | Admitting: Adult Health

## 2017-06-22 ENCOUNTER — Encounter: Payer: Self-pay | Admitting: Adult Health

## 2017-06-22 VITALS — BP 134/86 | Temp 98.1°F | Wt 118.0 lb

## 2017-06-22 DIAGNOSIS — E782 Mixed hyperlipidemia: Secondary | ICD-10-CM | POA: Diagnosis not present

## 2017-06-22 DIAGNOSIS — Z0000 Encounter for general adult medical examination without abnormal findings: Secondary | ICD-10-CM | POA: Diagnosis not present

## 2017-06-22 DIAGNOSIS — I10 Essential (primary) hypertension: Secondary | ICD-10-CM

## 2017-06-22 DIAGNOSIS — R1900 Intra-abdominal and pelvic swelling, mass and lump, unspecified site: Secondary | ICD-10-CM | POA: Diagnosis not present

## 2017-06-22 LAB — HEPATIC FUNCTION PANEL
ALBUMIN: 4.4 g/dL (ref 3.5–5.2)
ALK PHOS: 67 U/L (ref 39–117)
ALT: 95 U/L — ABNORMAL HIGH (ref 0–35)
AST: 19 U/L (ref 0–37)
BILIRUBIN TOTAL: 0.5 mg/dL (ref 0.2–1.2)
Bilirubin, Direct: 0.1 mg/dL (ref 0.0–0.3)
Total Protein: 7 g/dL (ref 6.0–8.3)

## 2017-06-22 LAB — LIPID PANEL
CHOLESTEROL: 236 mg/dL — AB (ref 0–200)
HDL: 53.9 mg/dL (ref >39.00)
LDL CALC: 157 mg/dL — AB (ref 0–99)
NonHDL: 181.84
Total CHOL/HDL Ratio: 4
Triglycerides: 125 mg/dL (ref 0.0–149.0)
VLDL: 25 mg/dL (ref 0.0–40.0)

## 2017-06-22 LAB — BASIC METABOLIC PANEL
BUN: 14 mg/dL (ref 6–23)
CHLORIDE: 99 meq/L (ref 96–112)
CO2: 25 mEq/L (ref 19–32)
Calcium: 9.9 mg/dL (ref 8.4–10.5)
Creatinine, Ser: 0.64 mg/dL (ref 0.40–1.20)
GFR: 122.19 mL/min (ref >60.00)
Glucose, Bld: 82 mg/dL (ref 70–99)
POTASSIUM: 4.7 meq/L (ref 3.5–5.1)
Sodium: 134 mEq/L — ABNORMAL LOW (ref 135–145)

## 2017-06-22 LAB — CBC WITH DIFFERENTIAL/PLATELET
BASOS ABS: 0 10*3/uL (ref 0.0–0.1)
Basophils Relative: 0.2 % (ref 0.0–3.0)
Eosinophils Absolute: 0 10*3/uL (ref 0.0–0.7)
Eosinophils Relative: 0.3 % (ref 0.0–5.0)
HCT: 41.7 % (ref 36.0–46.0)
Hemoglobin: 13.9 g/dL (ref 12.0–15.0)
LYMPHS ABS: 1.8 10*3/uL (ref 0.7–4.0)
Lymphocytes Relative: 19.1 % (ref 12.0–46.0)
MCHC: 33.4 g/dL (ref 30.0–36.0)
MCV: 93.3 fl (ref 78.0–100.0)
MONO ABS: 0.7 10*3/uL (ref 0.1–1.0)
MONOS PCT: 7.6 % (ref 3.0–12.0)
NEUTROS ABS: 7 10*3/uL (ref 1.4–7.7)
NEUTROS PCT: 72.8 % (ref 43.0–77.0)
PLATELETS: 385 10*3/uL (ref 150.0–400.0)
RBC: 4.47 Mil/uL (ref 3.87–5.11)
RDW: 13.1 % (ref 11.5–15.5)
WBC: 9.6 10*3/uL (ref 4.0–10.5)

## 2017-06-22 LAB — TSH: TSH: 0.58 u[IU]/mL (ref 0.35–4.50)

## 2017-06-22 NOTE — Progress Notes (Signed)
Subjective:    Patient ID: Anita Whitehead, female    DOB: Sep 14, 1955, 61 y.o.   MRN: [id number]  HPI Patient presents for yearly preventative medicine examination. She is a pleasant 61 year old female who  has a past medical history of Allergy, Cervical cancer ([place]) (1989), History of colon polyps, Hyperlipidemia, Hypertension, and PMS (premenstrual syndrome).  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 She has been seen by [facility] and was seen by [facility] yesterday. Her pain is controlled with the combination of Flexeril, and hydrocodone for breakthrough pain. The plan currently is to do 5 weeks of radiation therapy and then surgical removal. Her spirits are high and she states " I am ready for this challenge."   All immunizations and health maintenance protocols were reviewed with the patient and needed orders were placed. She is UTD on vaccinations   Appropriate screening laboratory values were ordered for the patient including screening of hyperlipidemia, renal function and hepatic function.  Medication reconciliation,  past medical history, social history, problem list and allergies were reviewed in detail with the patient  Goals were established with regard to weight loss, exercise, and  diet in compliance with medications. She is working with a Engineer, maintenance (IT) at [facility] was discussed. She does not have an advanced directive nor a living will   She is up to date on her mammogram and colonoscopy. She participates in routine dental and vision care    Review of Systems  Constitutional: Negative.   HENT: Negative.   Eyes: Negative.   Respiratory: Negative.   Cardiovascular: Negative.   Endocrine: Negative.   Genitourinary: Negative.   Musculoskeletal: Negative.   Skin: Negative.   Neurological: Negative.   Hematological: Negative.     Psychiatric/Behavioral: Negative.   All other systems reviewed and are negative.  Past Medical History:  Diagnosis Date  . Allergy   . Cervical cancer ([place]) 1989  . History of colon polyps    TA   . Hyperlipidemia   . Hypertension   . PMS (premenstrual syndrome)     Social History   Socioeconomic History  . Marital status: Married    Spouse name: [name]  . Number of children: 1  . Years of education: Not on file  . Highest education level: Not on file  Social Needs  . Financial resource strain: Not on file  . Food insecurity - worry: Not on file  . Food insecurity - inability: Not on file  . Transportation needs - medical: Not on file  . Transportation needs - non-medical: Not on file  Occupational History  . Occupation: Education officer, environmental  Tobacco Use  . Smoking status: Former Research scientist (life sciences)  . Smokeless tobacco: Never Used  . Tobacco comment: hooka  Substance and Sexual Activity  . Alcohol use: Yes    Alcohol/week: 0.0 oz    Comment: 3 beers a week and 2 cocktails during the week- 1 drink a day. Last drink: About a month ago.   . Drug use: No  . Sexual activity: Not on file  Other Topics Concern  . Not on file  Social History Narrative   Civil engineer, contracting for [employer]   Married for 16 years   2 boys, one in [place] and one in [place]    Past Surgical History:  Procedure Laterality Date  .  ABDOMINAL HYSTERECTOMY    . adenomatous colon polyps    . COLONOSCOPY     04-01-2009,2006 with [facility] [place]   . POLYPECTOMY      Family History  Problem Relation Age of Onset  . Diabetes Brother   . Hypertension Mother   . Ovarian cancer Mother 36  . Hypertension Sister   . Breast cancer Sister 28  . Lung cancer Father 47  . Colon cancer Neg Hx   . Colon polyps Neg Hx   . Esophageal cancer Neg Hx   . Rectal cancer Neg Hx   . Stomach cancer Neg Hx     No Known Allergies  Current Outpatient Medications on File Prior to Visit  Medication Sig  Dispense Refill  . cyclobenzaprine (FLEXERIL) 10 MG tablet Take 1 tablet (10 mg total) 3 (three) times daily as needed by mouth for muscle spasms. 30 tablet 0  . dexamethasone (DECADRON) 4 MG tablet Take 1 tablet (4 mg total) daily by mouth. Take with food 30 tablet 1  . feeding supplement, ENSURE ENLIVE, (ENSURE ENLIVE) LIQD Take 237 mLs by mouth 2 (two) times daily between meals. 237 mL 12  . HYDROcodone-acetaminophen (NORCO/VICODIN) 5-325 MG tablet Take 1 tablet every 6 (six) hours as needed by mouth.  0  . lisinopril (PRINIVIL,ZESTRIL) 20 MG tablet take 1 tablet by mouth once daily 90 tablet 3  . oxyCODONE-acetaminophen (PERCOCET/ROXICET) 5-325 MG tablet Take 1-2 tablets every 4 (four) hours as needed by mouth for severe pain. 60 tablet 0  . senna (SENOKOT) 8.6 MG TABS tablet Take 1 tablet (8.6 mg total) 2 (two) times daily by mouth. 120 each 0   No current facility-administered medications on file prior to visit.     BP 134/86 (BP Location: Left Arm)   Temp 98.1 F (36.7 C) (Oral)   Wt 118 lb (53.5 kg)   BMI 21.58 kg/m       Objective:   Physical Exam  Constitutional: She is oriented to person, place, and time. She appears well-developed and well-nourished. No distress.  HENT:  Head: Normocephalic and atraumatic.  Right Ear: External ear normal.  Left Ear: External ear normal.  Nose: Nose normal.  Mouth/Throat: Oropharynx is clear and moist. No oropharyngeal exudate.  Eyes: Conjunctivae and EOM are normal. Pupils are equal, round, and reactive to light. Right eye exhibits no discharge. Left eye exhibits no discharge.  Neck: Trachea normal and normal range of motion. Neck supple. No JVD present. Carotid bruit is not present. No tracheal deviation present. No thyromegaly present.  Cardiovascular: Normal rate, regular rhythm, normal heart sounds and intact distal pulses. Exam reveals no gallop and no friction rub.  No murmur heard. Pulmonary/Chest: Effort normal and breath sounds  normal. No stridor. No respiratory distress. She has no wheezes. She has no rales. She exhibits no tenderness.  Abdominal: Soft. Normal appearance and bowel sounds are normal. She exhibits no distension and no mass. There is no tenderness. There is no rebound and no guarding.  Musculoskeletal: Normal range of motion. She exhibits no edema, tenderness or deformity.  Lymphadenopathy:    She has no cervical adenopathy.  Neurological: She is alert and oriented to person, place, and time. She has normal reflexes. She displays normal reflexes. No cranial nerve deficit. She exhibits normal muscle tone. Coordination normal.  Skin: Skin is warm and dry. No rash noted. No erythema. No pallor.  Psychiatric: She has a normal mood and affect. Her behavior is normal. Judgment and thought content  normal.  Nursing note and vitals reviewed.     Assessment & Plan:  1. Routine general medical examination at a health care facility - Encouraged high protein meals and meal replacements. Continue to work with dietician  - Advanced directive and living will information given  - Follow up in one year or sooner if needed - Basic metabolic panel - CBC with Differential/Platelet - Hepatic function panel - Lipid panel - TSH  2. Essential hypertension - Controlled. No change in medication  - Basic metabolic panel - CBC with Differential/Platelet - Hepatic function panel - Lipid panel - TSH  3. Mixed hyperlipidemia - Consider statin  - Basic metabolic panel - CBC with Differential/Platelet - Hepatic function panel - Lipid panel - TSH  4. Pelvic mass in female - Continue with POC by [place], NP

## 2017-06-25 ENCOUNTER — Other Ambulatory Visit: Payer: Self-pay | Admitting: *Deleted

## 2017-06-25 ENCOUNTER — Telehealth: Payer: Self-pay | Admitting: *Deleted

## 2017-06-25 ENCOUNTER — Encounter: Payer: Self-pay | Admitting: Adult Health

## 2017-06-25 MED ORDER — CYCLOBENZAPRINE HCL 10 MG PO TABS: 10.0000 mg | ORAL_TABLET | Freq: Three times a day (TID) | ORAL | 0 refills | Status: DC | PRN

## 2017-06-25 MED ORDER — ATORVASTATIN CALCIUM 10 MG PO TABS: 10.0000 mg | ORAL_TABLET | Freq: Every day | ORAL | 3 refills | Status: DC

## 2017-06-25 NOTE — Telephone Encounter (Signed)
Please refill flexeril I will schedule return visit next week 11/29, will send scheduling msg

## 2017-06-25 NOTE — Telephone Encounter (Signed)
Pt states she is doing "pretty good" with her pain control. Is not taking as much of the hydrocodone- has plenty left.  Needs refill of Flexeril.  Would like a return appt with Dr Alvy Bimler between now and surgery- none else is addressing pain. Has RT starting on 11/30.

## 2017-06-26 ENCOUNTER — Telehealth: Payer: Self-pay | Admitting: Hematology and Oncology

## 2017-06-26 NOTE — Telephone Encounter (Signed)
11/20/218 @ 10:16 am called [phone] and informed patient her FMLA/Disability forms were successfully faxed on 06/20/2017.  She requested a copy be mailed for her personal records.

## 2017-06-27 ENCOUNTER — Encounter (INDEPENDENT_AMBULATORY_CARE_PROVIDER_SITE_OTHER): Payer: Self-pay | Admitting: Surgery

## 2017-06-27 ENCOUNTER — Ambulatory Visit (INDEPENDENT_AMBULATORY_CARE_PROVIDER_SITE_OTHER): Payer: 59 | Admitting: Surgery

## 2017-06-27 VITALS — Ht 62.0 in | Wt 118.0 lb

## 2017-06-27 DIAGNOSIS — C495 Malignant neoplasm of connective and soft tissue of pelvis: Secondary | ICD-10-CM | POA: Diagnosis not present
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
## 2017-07-02 ENCOUNTER — Telehealth: Payer: Self-pay | Admitting: Hematology and Oncology

## 2017-07-02 NOTE — Telephone Encounter (Signed)
Scheduled message pe r11/19 sch msg - spoke with patient regarding added appointments.

## 2017-07-04 ENCOUNTER — Encounter: Payer: Self-pay | Admitting: Adult Health

## 2017-07-05 ENCOUNTER — Ambulatory Visit (HOSPITAL_BASED_OUTPATIENT_CLINIC_OR_DEPARTMENT_OTHER): Payer: 59 | Admitting: Hematology and Oncology

## 2017-07-05 DIAGNOSIS — I10 Essential (primary) hypertension: Secondary | ICD-10-CM | POA: Diagnosis not present

## 2017-07-05 DIAGNOSIS — G893 Neoplasm related pain (acute) (chronic): Secondary | ICD-10-CM

## 2017-07-05 DIAGNOSIS — C414 Malignant neoplasm of pelvic bones, sacrum and coccyx: Secondary | ICD-10-CM

## 2017-07-05 DIAGNOSIS — K5903 Drug induced constipation: Secondary | ICD-10-CM | POA: Diagnosis not present

## 2017-07-05 DIAGNOSIS — R64 Cachexia: Secondary | ICD-10-CM

## 2017-07-05 MED ORDER — AMLODIPINE BESYLATE 10 MG PO TABS: 10.0000 mg | ORAL_TABLET | Freq: Every day | ORAL | 1 refills | Status: DC

## 2017-07-05 MED ORDER — DEXAMETHASONE 4 MG PO TABS: 4.0000 mg | ORAL_TABLET | Freq: Two times a day (BID) | ORAL | 1 refills | Status: DC

## 2017-07-05 MED ORDER — GABAPENTIN 300 MG PO CAPS: 300.0000 mg | ORAL_CAPSULE | Freq: Three times a day (TID) | ORAL | 1 refills | Status: AC

## 2017-07-06 ENCOUNTER — Encounter: Payer: Self-pay | Admitting: Hematology and Oncology

## 2017-07-06 DIAGNOSIS — K5903 Drug induced constipation: Secondary | ICD-10-CM | POA: Insufficient documentation

## 2017-07-06 NOTE — Assessment & Plan Note (Signed)
She has no further weight loss since I started her on dexamethasone I encouraged her to increase nutritional supplement as tolerated

## 2017-07-06 NOTE — Assessment & Plan Note (Signed)
The patient takes lisinopril in the past Due to risk of tumor lysis/renal injury during treatment, I recommend discontinuation of lisinopril and switch her over to amlodipine

## 2017-07-06 NOTE — Progress Notes (Signed)
[facility] OFFICE PROGRESS NOTE  Patient Care Team: [name], NP as PCP - General (Family Medicine)  SUMMARY OF ONCOLOGIC HISTORY:   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[place])   05/31/2017 Imaging    MR pelvis 1. 9.6 x 6.3 x 11.9 cm heterogeneous mass within the left pelvis at the level of the left iliacus as above. Primary differential considerations consist of possible sarcomatous tumor, potentially arising from the left iliacus muscle. In neurogenic tumor could also be considered. Further evaluation with histologic sampling recommended, as this lesion would likely be amenable to percutaneous sampling. 2. Degenerative disc bulging and facet arthropathy at L4-5 and L5-S1 with resultant mild canal with moderate left subarticular stenosis. No frank neural impingement within the lumbar spine      05/31/2017 - 06/12/2017 Hospital Admission    She had recurrent admission to the hospital due to severe pain.  She underwent CT-guided biopsy and pain management      06/01/2017 Procedure    Technically successful CT-guided core biopsy, left pelvic mass      06/01/2017 Pathology Results    Soft tissue mass, biopsy, Left Pelvic Mass - MALIGNANT SPINDLE CELL PROLIFERATION. - SEE COMMENT. Microscopic Comment The biopsy fragments reveal an atypical spindle cell proliferation. The lesion shows increased cellularity around large caliber blood vessels. There is moderate cytologic atypia as well as mitoses. The tumor cells are positive for CD34, desmin, S100, and focally positive for p63. They are negative for smooth muscle myosin, smooth muscle actin, CD99, CD31, CD117, calponin, EMA, cytokeratin 8/18 and cytokeratin AE1/AE3. The immunohistochemical profile is non-specific; however, the differential diagnosis includes a sarcoma such as a malignant peripheral nerve sheath tumor. Dr. Claudette Laws has reviewed the case and is in essential agreement with this interpretation. (JBK:ah 10/30-31/18)       INTERVAL HISTORY: Please see below for problem oriented charting. She returns with her husband for further follow-up Her pain is better controlled She has no further weight loss She had recent constipation, resolved with laxatives She has appointment to return to [facility] for plan for preoperative radiation therapy followed by surgery She denies further neurological deficits No recent falls  REVIEW OF SYSTEMS:   Constitutional: Denies fevers, chills or abnormal weight loss Eyes: Denies blurriness of vision Ears, nose, mouth, throat, and face: Denies mucositis or sore throat Respiratory: Denies cough, dyspnea or wheezes Cardiovascular: Denies palpitation, chest discomfort or lower extremity swelling Skin: Denies abnormal skin rashes Lymphatics: Denies new lymphadenopathy or easy bruising Neurological:Denies numbness, tingling or new weaknesses Behavioral/Psych: Mood is stable, no new changes  All other systems were reviewed with the patient and are negative.  I have reviewed the past medical history, past surgical history, social history and family history with the patient and they are unchanged from previous note.  ALLERGIES:  has No Known Allergies.  MEDICATIONS:  Current Outpatient Medications  Medication Sig Dispense Refill  . amLODipine (NORVASC) 10 MG tablet Take 1 tablet (10 mg total) by mouth daily. 90 tablet 1  . atorvastatin (LIPITOR) 10 MG tablet Take 1 tablet (10 mg total) daily by mouth. 90 tablet 3  . cyclobenzaprine (FLEXERIL) 10 MG tablet Take 1 tablet (10 mg total) 3 (three) times daily as needed by mouth for muscle spasms. 30 tablet 0  . dexamethasone (DECADRON) 4 MG tablet Take 1 tablet (4 mg total) by mouth 2 (two) times daily. Take with food 60 tablet 1  . feeding supplement, ENSURE ENLIVE, (ENSURE ENLIVE) LIQD Take 237  mLs by mouth 2 (two) times daily between meals. 237 mL 12  . gabapentin (NEURONTIN) 300 MG capsule Take 1 capsule (300 mg total) by  mouth 3 (three) times daily. 90 capsule 1  . HYDROcodone-acetaminophen (NORCO/VICODIN) 5-325 MG tablet Take 1 tablet every 6 (six) hours as needed by mouth.  0  . senna (SENOKOT) 8.6 MG TABS tablet Take 1 tablet (8.6 mg total) 2 (two) times daily by mouth. 120 each 0   No current facility-administered medications for this visit.     PHYSICAL EXAMINATION: ECOG PERFORMANCE STATUS: 1 - Symptomatic but completely ambulatory  Vitals:   07/05/17 0950  BP: (!) 144/95  Pulse: (!) 127  Resp: 18  Temp: 98.9 F (37.2 C)  SpO2: 95%   Filed Weights   07/05/17 0950  Weight: 119 lb 1.6 oz (54 kg)    GENERAL:alert, no distress and comfortable SKIN: skin color, texture, turgor are normal, no rashes or significant lesions EYES: normal, Conjunctiva are pink and non-injected, sclera clear OROPHARYNX:no exudate, no erythema and lips, buccal mucosa, and tongue normal  NECK: supple, thyroid normal size, non-tender, without nodularity LYMPH:  no palpable lymphadenopathy in the cervical, axillary or inguinal LUNGS: clear to auscultation and percussion with normal breathing effort HEART: regular rate & rhythm and no murmurs and no lower extremity edema ABDOMEN:abdomen soft, non-tender and normal bowel sounds Musculoskeletal:no cyanosis of digits and no clubbing  NEURO: alert & oriented x 3 with fluent speech, no focal motor/sensory deficits  LABORATORY DATA:  I have reviewed the data as listed    Component Value Date/Time   NA 134 (L) 06/22/2017 0818   K 4.7 06/22/2017 0818   CL 99 06/22/2017 0818   CO2 25 06/22/2017 0818   GLUCOSE 82 06/22/2017 0818   BUN 14 06/22/2017 0818   CREATININE 0.64 06/22/2017 0818   CALCIUM 9.9 06/22/2017 0818   PROT 7.0 06/22/2017 0818   ALBUMIN 4.4 06/22/2017 0818   AST 19 06/22/2017 0818   ALT 95 (H) 06/22/2017 0818   ALKPHOS 67 06/22/2017 0818   BILITOT 0.5 06/22/2017 0818   GFRNONAA >60 06/12/2017 0438   GFRAA >60 06/12/2017 0438    No results found  for: SPEP, UPEP  Lab Results  Component Value Date   WBC 9.6 06/22/2017   NEUTROABS 7.0 06/22/2017   HGB 13.9 06/22/2017   HCT 41.7 06/22/2017   MCV 93.3 06/22/2017   PLT 385.0 06/22/2017      Chemistry      Component Value Date/Time   NA 134 (L) 06/22/2017 0818   K 4.7 06/22/2017 0818   CL 99 06/22/2017 0818   CO2 25 06/22/2017 0818   BUN 14 06/22/2017 0818   CREATININE 0.64 06/22/2017 0818      Component Value Date/Time   CALCIUM 9.9 06/22/2017 0818   ALKPHOS 67 06/22/2017 0818   AST 19 06/22/2017 0818   ALT 95 (H) 06/22/2017 0818   BILITOT 0.5 06/22/2017 0818      ASSESSMENT & PLAN:  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[place]) I have reviewed recommendation for [facility] The plan of care is to proceed with preoperative radiation treatment followed by surgery The patient does not have her radiation schedule I recommend she call us soon after radiation has started for further follow-up/supportive care She found that low-dose dexamethasone appears to help with her pain and symptoms I recommend she continues dexamethasone 4 mg twice a day for a while with the aim to start taper  once radiation has begun  Cancer associated pain Her pain is better controlled since we started her on dexamethasone She will continue narcotic prescription as needed as well I will reassess her pain control in her next visit   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([place]) She has no further weight loss since I started her on dexamethasone I encouraged her to increase nutritional supplement as tolerated  Essential hypertension The patient takes lisinopril in the past Due to risk of tumor lysis/renal injury during treatment, I recommend discontinuation of lisinopril and switch her over to amlodipine  Drug induced constipation She had mild constipation, likely due to narcotic prescription We spent some time discussing about laxative therapy   No orders of the defined types were placed in this  encounter.  All questions were answered. The patient knows to call the clinic with any problems, questions or concerns. No barriers to learning was detected. I spent 25 minutes counseling the patient face to face. The total time spent in the appointment was 30 minutes and more than 50% was on counseling and review of test results     [name], MD 07/06/2017 3:55 PM

## 2017-07-06 NOTE — Assessment & Plan Note (Signed)
I have reviewed recommendation for [facility] The plan of care is to proceed with preoperative radiation treatment followed by surgery The patient does not have her radiation schedule I recommend she call us soon after radiation has started for further follow-up/supportive care She found that low-dose dexamethasone appears to help with her pain and symptoms I recommend she continues dexamethasone 4 mg twice a day for a while with the aim to start taper once radiation has begun

## 2017-07-06 NOTE — Assessment & Plan Note (Signed)
Her pain is better controlled since we started her on dexamethasone She will continue narcotic prescription as needed as well I will reassess her pain control in her next visit

## 2017-07-06 NOTE — Assessment & Plan Note (Signed)
She had mild constipation, likely due to narcotic prescription We spent some time discussing about laxative therapy

## 2017-07-07 ENCOUNTER — Encounter: Payer: Self-pay | Admitting: Hematology and Oncology

## 2017-07-07 ENCOUNTER — Telehealth: Payer: Self-pay | Admitting: Hematology and Oncology

## 2017-07-07 NOTE — Telephone Encounter (Signed)
Scheduled appt per 11/30 sch msg - spoke with patient regarding appt that was added.

## 2017-07-13 ENCOUNTER — Telehealth: Payer: Self-pay | Admitting: Hematology and Oncology

## 2017-07-13 ENCOUNTER — Ambulatory Visit (HOSPITAL_BASED_OUTPATIENT_CLINIC_OR_DEPARTMENT_OTHER): Payer: 59 | Admitting: Hematology and Oncology

## 2017-07-13 ENCOUNTER — Encounter: Payer: Self-pay | Admitting: Hematology and Oncology

## 2017-07-13 DIAGNOSIS — C414 Malignant neoplasm of pelvic bones, sacrum and coccyx: Secondary | ICD-10-CM

## 2017-07-13 DIAGNOSIS — R64 Cachexia: Secondary | ICD-10-CM | POA: Diagnosis not present

## 2017-07-13 DIAGNOSIS — G893 Neoplasm related pain (acute) (chronic): Secondary | ICD-10-CM

## 2017-07-13 DIAGNOSIS — M7989 Other specified soft tissue disorders: Secondary | ICD-10-CM

## 2017-07-13 DIAGNOSIS — K5903 Drug induced constipation: Secondary | ICD-10-CM

## 2017-07-13 MED ORDER — MORPHINE SULFATE ER 15 MG PO TBCR: 15.0000 mg | EXTENDED_RELEASE_TABLET | Freq: Two times a day (BID) | ORAL | 0 refills | Status: DC

## 2017-07-13 MED FILL — MORPHINE SULF ER 15 MG TAB: 15 | 30 days supply | Qty: 60 | Fill #0

## 2017-07-13 NOTE — Assessment & Plan Note (Signed)
She is doing very well so far with treatment She is getting stronger and able to exercise She has no further weight loss I recommend dexamethasone taper to 4 mg daily with plan for further taper in the future I will see her back again in 2 weeks for further supportive care

## 2017-07-13 NOTE — Assessment & Plan Note (Signed)
Her pain is manageable with current prescription pain medicine except at nighttime I recommend a trial of long-acting morphine sulfate I did warn her about risk of nausea, constipation and sedation I will reassess pain management in her next visit

## 2017-07-13 NOTE — Telephone Encounter (Signed)
Gave avs and calendar for December  °

## 2017-07-13 NOTE — Assessment & Plan Note (Signed)
Her appetite is improving Recommend reducing the dose of dexamethasone

## 2017-07-13 NOTE — Progress Notes (Signed)
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 9.6 x 6.3 x 11.9 cm heterogeneous mass within the left pelvis at the level of the left iliacus as above. Primary differential considerations consist of possible sarcomatous tumor, potentially arising from the left iliacus muscle. In neurogenic tumor could also be considered. Further evaluation with histologic sampling recommended, as this lesion would likely be amenable to percutaneous sampling. 2. Degenerative disc bulging and facet arthropathy at L4-5 and L5-S1 with resultant mild canal with moderate left subarticular stenosis. No frank neural impingement within the lumbar spine      05/31/2017 - 06/12/2017 Hospital Admission    She had recurrent admission to the hospital due to severe pain.  She underwent CT-guided biopsy and pain management      06/01/2017 Procedure    Technically successful CT-guided core biopsy, left pelvic mass      06/01/2017 Pathology Results    Soft tissue mass, biopsy, Left Pelvic Mass - MALIGNANT SPINDLE CELL PROLIFERATION. - SEE COMMENT. Microscopic Comment The biopsy fragments reveal an atypical spindle cell proliferation. The lesion shows increased cellularity around large caliber blood vessels. There is moderate cytologic atypia as well as mitoses. The tumor cells are positive for CD34, desmin, S100, and focally positive for p63. They are negative for smooth muscle myosin, smooth muscle actin, CD99, CD31, CD117, calponin, EMA, cytokeratin 8/18 and cytokeratin AE1/AE3. The immunohistochemical profile is non-specific; however, the differential diagnosis includes a sarcoma such as a malignant peripheral nerve sheath tumor. Dr. Claudette Laws has reviewed the case and is in essential agreement with this interpretation. (JBK:ah 10/30-31/18)      07/06/2017 -  Radiation Therapy    She received preoperative radiation therapy at [facility]: Please see below for problem oriented charting. She returns for further follow-up She was doing better She is able to exercise twice a day on her bike at least 30 minutes a day Her pain control has improved Her only complaints are unilateral leg edema and poor sleep at night due to pain She denies nausea or constipation She has no further weight loss  REVIEW OF SYSTEMS:   Constitutional: Denies fevers, chills or abnormal weight loss Eyes: Denies blurriness of vision Ears, nose, mouth, throat, and face: Denies mucositis or sore throat Respiratory: Denies cough, dyspnea or wheezes Cardiovascular: Denies palpitation, chest discomfort  Gastrointestinal:  Denies nausea, heartburn or change in bowel habits Skin: Denies abnormal skin rashes Lymphatics: Denies new lymphadenopathy or easy bruising Neurological:Denies numbness, tingling or new weaknesses Behavioral/Psych: Mood is stable, no new changes  All other systems were reviewed with the patient and are negative.  I have reviewed the past medical history, past surgical history, social history and family history with the patient and they are unchanged from previous note.  ALLERGIES:  has No Known Allergies.  MEDICATIONS:  Current Outpatient Medications  Medication Sig Dispense Refill  . atorvastatin (LIPITOR) 10 MG tablet Take 1 tablet (10 mg total) daily by mouth. 90 tablet 3  . cyclobenzaprine (FLEXERIL) 10 MG tablet Take 1 tablet (10 mg total) 3 (three) times daily as needed by mouth for muscle spasms. 30 tablet 0  . dexamethasone (DECADRON) 4 MG tablet Take 1 tablet (4 mg total) by mouth 2 (two) times daily. Take with  food 60 tablet 1  . feeding supplement, ENSURE ENLIVE, (ENSURE ENLIVE) LIQD Take 237 mLs by mouth 2 (two) times daily between meals. 237 mL 12  . gabapentin (NEURONTIN) 300 MG capsule Take 1 capsule  (300 mg total) by mouth 3 (three) times daily. 90 capsule 1  . HYDROcodone-acetaminophen (NORCO/VICODIN) 5-325 MG tablet Take 1 tablet every 6 (six) hours as needed by mouth.  0  . morphine (MS CONTIN) 15 MG 12 hr tablet Take 1 tablet (15 mg total) by mouth every 12 (twelve) hours. 60 tablet 0  . senna (SENOKOT) 8.6 MG TABS tablet Take 1 tablet (8.6 mg total) 2 (two) times daily by mouth. 120 each 0   No current facility-administered medications for this visit.     PHYSICAL EXAMINATION: ECOG PERFORMANCE STATUS: 1 - Symptomatic but completely ambulatory  Vitals:   07/13/17 1416  BP: 139/81  Pulse: (!) 123  Resp: 20  Temp: 97.8 F (36.6 C)  SpO2: 98%   Filed Weights   07/13/17 1416  Weight: 119 lb 8 oz (54.2 kg)    GENERAL:alert, no distress and comfortable SKIN: skin color, texture, turgor are normal, no rashes or significant lesions EYES: normal, Conjunctiva are pink and non-injected, sclera clear OROPHARYNX:no exudate, no erythema and lips, buccal mucosa, and tongue normal  NECK: supple, thyroid normal size, non-tender, without nodularity LYMPH:  no palpable lymphadenopathy in the cervical, axillary or inguinal LUNGS: clear to auscultation and percussion with normal breathing effort HEART: regular rate & rhythm and no murmurs with mild left lower extremity edema ABDOMEN:abdomen soft, non-tender and normal bowel sounds Musculoskeletal:no cyanosis of digits and no clubbing  NEURO: alert & oriented x 3 with fluent speech, no focal motor/sensory deficits  LABORATORY DATA:  I have reviewed the data as listed    Component Value Date/Time   NA 134 (L) 06/22/2017 0818   K 4.7 06/22/2017 0818   CL 99 06/22/2017 0818   CO2 25 06/22/2017 0818   GLUCOSE 82 06/22/2017 0818   BUN 14 06/22/2017 0818   CREATININE 0.64 06/22/2017 0818   CALCIUM 9.9 06/22/2017 0818   PROT 7.0 06/22/2017 0818   ALBUMIN 4.4 06/22/2017 0818   AST 19 06/22/2017 0818   ALT 95 (H) 06/22/2017 0818    ALKPHOS 67 06/22/2017 0818   BILITOT 0.5 06/22/2017 0818   GFRNONAA >60 06/12/2017 0438   GFRAA >60 06/12/2017 0438    No results found for: SPEP, UPEP  Lab Results  Component Value Date   WBC 9.6 06/22/2017   NEUTROABS 7.0 06/22/2017   HGB 13.9 06/22/2017   HCT 41.7 06/22/2017   MCV 93.3 06/22/2017   PLT 385.0 06/22/2017      Chemistry      Component Value Date/Time   NA 134 (L) 06/22/2017 0818   K 4.7 06/22/2017 0818   CL 99 06/22/2017 0818   CO2 25 06/22/2017 0818   BUN 14 06/22/2017 0818   CREATININE 0.64 06/22/2017 0818      Component Value Date/Time   CALCIUM 9.9 06/22/2017 0818   ALKPHOS 67 06/22/2017 0818   AST 19 06/22/2017 0818   ALT 95 (H) 06/22/2017 0818   BILITOT 0.5 06/22/2017 0818      ASSESSMENT & PLAN:  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[place]) She is doing very well so far with treatment She is getting stronger and able to exercise She has no further weight loss I recommend dexamethasone taper to 4 mg daily with plan for further taper in the  future I will see her back again in 2 weeks for further supportive care  Cancer associated pain Her pain is manageable with current prescription pain medicine except at nighttime I recommend a trial of long-acting morphine sulfate I did warn her about risk of nausea, constipation and sedation I will reassess pain management in her next visit  Drug induced constipation She is doing well with laxative therapy  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([place]) Her appetite is improving Recommend reducing the dose of dexamethasone  Left leg swelling Likely due to lymphedema from external compression related to the tumor Clinically, it does not look like she has DVT I reassured the patient Hopefully, with improved mobility and reduced dose of dexamethasone, leg swelling will improve as well   No orders of the defined types were placed in this encounter.  All questions were answered. The patient knows to call the clinic with any  problems, questions or concerns. No barriers to learning was detected. I spent 15 minutes counseling the patient face to face. The total time spent in the appointment was 20 minutes and more than 50% was on counseling and review of test results     [name], MD 07/13/2017 2:50 PM

## 2017-07-13 NOTE — Assessment & Plan Note (Signed)
She is doing well with laxative therapy

## 2017-07-13 NOTE — Assessment & Plan Note (Signed)
Likely due to lymphedema from external compression related to the tumor Clinically, it does not look like she has DVT I reassured the patient Hopefully, with improved mobility and reduced dose of dexamethasone, leg swelling will improve as well

## 2017-07-16 ENCOUNTER — Encounter: Payer: Self-pay | Admitting: Adult Health

## 2017-07-19 ENCOUNTER — Encounter: Payer: Self-pay | Admitting: Hematology and Oncology

## 2017-07-19 ENCOUNTER — Other Ambulatory Visit: Payer: Self-pay | Admitting: Adult Health

## 2017-07-19 MED ORDER — SIMVASTATIN 10 MG PO TABS: 10.0000 mg | ORAL_TABLET | Freq: Every day | ORAL | 1 refills | Status: AC

## 2017-07-20 ENCOUNTER — Other Ambulatory Visit: Payer: Self-pay | Admitting: Hematology and Oncology

## 2017-07-20 DIAGNOSIS — C414 Malignant neoplasm of pelvic bones, sacrum and coccyx: Secondary | ICD-10-CM

## 2017-07-24 ENCOUNTER — Other Ambulatory Visit (HOSPITAL_BASED_OUTPATIENT_CLINIC_OR_DEPARTMENT_OTHER): Payer: 59

## 2017-07-24 ENCOUNTER — Ambulatory Visit (HOSPITAL_BASED_OUTPATIENT_CLINIC_OR_DEPARTMENT_OTHER): Payer: 59 | Admitting: Hematology and Oncology

## 2017-07-24 ENCOUNTER — Telehealth: Payer: Self-pay | Admitting: Hematology and Oncology

## 2017-07-24 DIAGNOSIS — M7989 Other specified soft tissue disorders: Secondary | ICD-10-CM | POA: Diagnosis not present

## 2017-07-24 DIAGNOSIS — G893 Neoplasm related pain (acute) (chronic): Secondary | ICD-10-CM

## 2017-07-24 DIAGNOSIS — C414 Malignant neoplasm of pelvic bones, sacrum and coccyx: Secondary | ICD-10-CM | POA: Diagnosis not present

## 2017-07-24 LAB — CBC WITH DIFFERENTIAL/PLATELET
BASO%: 0.1 % (ref 0.0–2.0)
BASOS ABS: 0 10*3/uL (ref 0.0–0.1)
EOS ABS: 0.2 10*3/uL (ref 0.0–0.5)
EOS%: 1.9 % (ref 0.0–7.0)
HEMATOCRIT: 34.4 % — AB (ref 34.8–46.6)
HEMOGLOBIN: 11.7 g/dL (ref 11.6–15.9)
LYMPH#: 0.8 10*3/uL — AB (ref 0.9–3.3)
LYMPH%: 10.8 % — ABNORMAL LOW (ref 14.0–49.7)
MCH: 30.6 pg (ref 25.1–34.0)
MCHC: 34 g/dL (ref 31.5–36.0)
MCV: 90.1 fL (ref 79.5–101.0)
MONO#: 0.5 10*3/uL (ref 0.1–0.9)
MONO%: 6.3 % (ref 0.0–14.0)
NEUT%: 80.9 % — ABNORMAL HIGH (ref 38.4–76.8)
NEUTROS ABS: 6.3 10*3/uL (ref 1.5–6.5)
RBC: 3.82 10*6/uL (ref 3.70–5.45)
RDW: 12.9 % (ref 11.2–14.5)
WBC: 7.8 10*3/uL (ref 3.9–10.3)

## 2017-07-24 LAB — COMPREHENSIVE METABOLIC PANEL
ALBUMIN: 3.3 g/dL — AB (ref 3.5–5.0)
ALT: 22 U/L (ref 0–55)
ANION GAP: 12 meq/L — AB (ref 3–11)
AST: 12 U/L (ref 5–34)
Alkaline Phosphatase: 71 U/L (ref 40–150)
BILIRUBIN TOTAL: 0.39 mg/dL (ref 0.20–1.20)
BUN: 10.4 mg/dL (ref 7.0–26.0)
CALCIUM: 9.2 mg/dL (ref 8.4–10.4)
CO2: 24 meq/L (ref 22–29)
Chloride: 99 mEq/L (ref 98–109)
Creatinine: 1.2 mg/dL — ABNORMAL HIGH (ref 0.6–1.1)
EGFR: 60 mL/min/{1.73_m2} — AB (ref >60)
GLUCOSE: 141 mg/dL — AB (ref 70–140)
Potassium: 3.9 mEq/L (ref 3.5–5.1)
Sodium: 135 mEq/L — ABNORMAL LOW (ref 136–145)
TOTAL PROTEIN: 6.7 g/dL (ref 6.4–8.3)

## 2017-07-24 LAB — LACTATE DEHYDROGENASE: LDH: 267 U/L — ABNORMAL HIGH (ref 125–245)

## 2017-07-24 MED ORDER — HYDROCODONE-ACETAMINOPHEN 5-325 MG PO TABS: 1.0000 | ORAL_TABLET | Freq: Four times a day (QID) | ORAL | 0 refills | Status: DC | PRN

## 2017-07-24 NOTE — Telephone Encounter (Signed)
Gave avs and calendar for January 2019 °

## 2017-07-25 ENCOUNTER — Telehealth: Payer: Self-pay

## 2017-07-25 NOTE — Telephone Encounter (Signed)
-----   Message from [name], MD sent at 07/24/2017  3:43 PM EST ----- Regarding: labs Pls call her  Labs are very good. She needs to drink a bit more water/liquids ----- Message ----- From: Interface, Lab In Three Zero One Sent: 07/24/2017   2:37 PM To: [name], MD

## 2017-07-25 NOTE — Telephone Encounter (Signed)
Called and left below message. Instructed to call for questions. 

## 2017-07-26 ENCOUNTER — Encounter: Payer: Self-pay | Admitting: Hematology and Oncology

## 2017-07-26 NOTE — Assessment & Plan Note (Signed)
She is tolerating radiation therapy well I recommend her to half a tablet daily for 2 weeks and then on alternate days I will see her back next month for further supportive care

## 2017-07-26 NOTE — Progress Notes (Signed)
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 9.6 x 6.3 x 11.9 cm heterogeneous mass within the left pelvis at the level of the left iliacus as above. Primary differential considerations consist of possible sarcomatous tumor, potentially arising from the left iliacus muscle. In neurogenic tumor could also be considered. Further evaluation with histologic sampling recommended, as this lesion would likely be amenable to percutaneous sampling. 2. Degenerative disc bulging and facet arthropathy at L4-5 and L5-S1 with resultant mild canal with moderate left subarticular stenosis. No frank neural impingement within the lumbar spine      05/31/2017 - 06/12/2017 Hospital Admission    She had recurrent admission to the hospital due to severe pain.  She underwent CT-guided biopsy and pain management      06/01/2017 Procedure    Technically successful CT-guided core biopsy, left pelvic mass      06/01/2017 Pathology Results    Soft tissue mass, biopsy, Left Pelvic Mass - MALIGNANT SPINDLE CELL PROLIFERATION. - SEE COMMENT. Microscopic Comment The biopsy fragments reveal an atypical spindle cell proliferation. The lesion shows increased cellularity around large caliber blood vessels. There is moderate cytologic atypia as well as mitoses. The tumor cells are positive for CD34, desmin, S100, and focally positive for p63. They are negative for smooth muscle myosin, smooth muscle actin, CD99, CD31, CD117, calponin, EMA, cytokeratin 8/18 and cytokeratin AE1/AE3. The immunohistochemical profile is non-specific; however, the differential diagnosis includes a sarcoma such as a malignant peripheral nerve sheath tumor. Dr. Claudette Laws has reviewed the case and is in essential agreement with this interpretation. (JBK:ah 10/30-31/18)      07/06/2017 -  Radiation Therapy    She received preoperative radiation therapy at [facility]: Please see below for problem oriented charting. She returns for symptom management and follow-up She is doing well Pain is well controlled She has lost some weight but she claims she is eating well She is active She noted persistent left leg swelling Denies nausea or constipation  REVIEW OF SYSTEMS:   Constitutional: Denies fevers, chills Eyes: Denies blurriness of vision Ears, nose, mouth, throat, and face: Denies mucositis or sore throat Respiratory: Denies cough, dyspnea or wheezes Cardiovascular: Denies palpitation, chest discomfort Gastrointestinal:  Denies nausea, heartburn or change in bowel habits Skin: Denies abnormal skin rashes Lymphatics: Denies new lymphadenopathy or easy bruising Neurological:Denies numbness, tingling or new weaknesses Behavioral/Psych: Mood is stable, no new changes  All other systems were reviewed with the patient and are negative.  I have reviewed the past medical history, past surgical history, social history and family history with the patient and they are unchanged from previous note.  ALLERGIES:  has No Known Allergies.  MEDICATIONS:  Current Outpatient Medications  Medication Sig Dispense Refill  . cyclobenzaprine (FLEXERIL) 10 MG tablet Take 1 tablet (10 mg total) 3 (three) times daily as needed by mouth for muscle spasms. 30 tablet 0  . dexamethasone (DECADRON) 4 MG tablet Take 1 tablet (4 mg total) by mouth 2 (two) times daily. Take with food 60 tablet 1  . feeding supplement, ENSURE ENLIVE, (ENSURE ENLIVE) LIQD Take 237 mLs by mouth 2 (two) times daily between meals. 237 mL 12  . gabapentin (NEURONTIN) 300 MG capsule Take 1 capsule (300 mg total) by  mouth 3 (three) times daily. 90 capsule 1  . HYDROcodone-acetaminophen (NORCO/VICODIN) 5-325 MG tablet Take 1 tablet by mouth every 6 (six) hours as needed. 90 tablet 0  .  morphine (MS CONTIN) 15 MG 12 hr tablet Take 1 tablet (15 mg total) by mouth every 12 (twelve) hours. 60 tablet 0  . senna (SENOKOT) 8.6 MG TABS tablet Take 1 tablet (8.6 mg total) 2 (two) times daily by mouth. 120 each 0  . simvastatin (ZOCOR) 10 MG tablet Take 1 tablet (10 mg total) by mouth at bedtime. 30 tablet 1   No current facility-administered medications for this visit.     PHYSICAL EXAMINATION: ECOG PERFORMANCE STATUS: 1 - Symptomatic but completely ambulatory  Vitals:   07/24/17 1414  BP: 132/81  Pulse: (!) 130  Resp: 18  Temp: 98 F (36.7 C)  SpO2: 100%   Filed Weights   07/24/17 1414  Weight: 118 lb 9.6 oz (53.8 kg)    GENERAL:alert, no distress and comfortable SKIN: skin color, texture, turgor are normal, no rashes or significant lesions EYES: normal, Conjunctiva are pink and non-injected, sclera clear OROPHARYNX:no exudate, no erythema and lips, buccal mucosa, and tongue normal  NECK: supple, thyroid normal size, non-tender, without nodularity LYMPH:  no palpable lymphadenopathy in the cervical, axillary or inguinal LUNGS: clear to auscultation and percussion with normal breathing effort HEART: regular rate & rhythm and no murmurs with mild left leg edema ABDOMEN:abdomen soft, non-tender and normal bowel sounds Musculoskeletal:no cyanosis of digits and no clubbing  NEURO: alert & oriented x 3 with fluent speech, no focal motor/sensory deficits  LABORATORY DATA:  I have reviewed the data as listed    Component Value Date/Time   NA 135 (L) 07/24/2017 1357   K 3.9 07/24/2017 1357   CL 99 06/22/2017 0818   CO2 24 07/24/2017 1357   GLUCOSE 141 (H) 07/24/2017 1357   BUN 10.4 07/24/2017 1357   CREATININE 1.2 (H) 07/24/2017 1357   CALCIUM 9.2 07/24/2017 1357   PROT 6.7 07/24/2017 1357   ALBUMIN 3.3 (L) 07/24/2017 1357   AST 12 07/24/2017 1357   ALT 22 07/24/2017 1357   ALKPHOS 71 07/24/2017 1357   BILITOT 0.39 07/24/2017 1357   GFRNONAA >60 06/12/2017 0438    GFRAA >60 06/12/2017 0438    No results found for: SPEP, UPEP  Lab Results  Component Value Date   WBC 7.8 07/24/2017   NEUTROABS 6.3 07/24/2017   HGB 11.7 07/24/2017   HCT 34.4 (L) 07/24/2017   MCV 90.1 07/24/2017   PLT 117 Large platelets present (L) 07/24/2017      Chemistry      Component Value Date/Time   NA 135 (L) 07/24/2017 1357   K 3.9 07/24/2017 1357   CL 99 06/22/2017 0818   CO2 24 07/24/2017 1357   BUN 10.4 07/24/2017 1357   CREATININE 1.2 (H) 07/24/2017 1357      Component Value Date/Time   CALCIUM 9.2 07/24/2017 1357   ALKPHOS 71 07/24/2017 1357   AST 12 07/24/2017 1357   ALT 22 07/24/2017 1357   BILITOT 0.39 07/24/2017 1357      ASSESSMENT & PLAN:  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  I reassured the patient   No orders of the defined types were placed in this encounter.  All questions were answered. The patient knows to call the clinic with any problems, questions or concerns. No barriers to learning was detected. I spent 15 minutes counseling the patient face to face. The total time spent in the appointment was 20 minutes and more than 50% was on counseling and review of test results     [name], MD 07/26/2017 7:11 AM

## 2017-07-26 NOTE — Assessment & Plan Note (Signed)
She has better pain control with recent medication changes I did warn her about risk of nausea, constipation and sedation I will reassess pain management in her next visit

## 2017-07-26 NOTE — Assessment & Plan Note (Signed)
She is active and exercising well Her mild leg swelling is likely due to tumor compression of lymphatics within her abdomen.  I reassured the patient

## 2017-08-14 ENCOUNTER — Telehealth: Payer: Self-pay | Admitting: Hematology and Oncology

## 2017-08-14 ENCOUNTER — Inpatient Hospital Stay: Payer: 59 | Attending: Hematology and Oncology | Admitting: Hematology and Oncology

## 2017-08-14 DIAGNOSIS — C414 Malignant neoplasm of pelvic bones, sacrum and coccyx: Secondary | ICD-10-CM | POA: Diagnosis present

## 2017-08-14 DIAGNOSIS — G893 Neoplasm related pain (acute) (chronic): Secondary | ICD-10-CM | POA: Insufficient documentation

## 2017-08-14 DIAGNOSIS — R64 Cachexia: Secondary | ICD-10-CM | POA: Insufficient documentation

## 2017-08-14 DIAGNOSIS — M7989 Other specified soft tissue disorders: Secondary | ICD-10-CM

## 2017-08-14 MED ORDER — HYDROCODONE-ACETAMINOPHEN 5-325 MG PO TABS: 1.0000 | ORAL_TABLET | Freq: Four times a day (QID) | ORAL | 0 refills | Status: DC | PRN

## 2017-08-14 NOTE — Telephone Encounter (Signed)
Gave patient AVS and calendar of upcoming February appointments.  °

## 2017-08-15 ENCOUNTER — Encounter: Payer: Self-pay | Admitting: Hematology and Oncology

## 2017-08-15 NOTE — Assessment & Plan Note (Signed)
She has lost some weight since the last time I saw her I recommend increase nutritional supplement as tolerated.

## 2017-08-15 NOTE — Progress Notes (Signed)
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 9.6 x 6.3 x 11.9 cm heterogeneous mass within the left pelvis at the level of the left iliacus as above. Primary differential considerations consist of possible sarcomatous tumor, potentially arising from the left iliacus muscle. In neurogenic tumor could also be considered. Further evaluation with histologic sampling recommended, as this lesion would likely be amenable to percutaneous sampling. 2. Degenerative disc bulging and facet arthropathy at L4-5 and L5-S1 with resultant mild canal with moderate left subarticular stenosis. No frank neural impingement within the lumbar spine      05/31/2017 - 06/12/2017 Hospital Admission    She had recurrent admission to the hospital due to severe pain.  She underwent CT-guided biopsy and pain management      06/01/2017 Procedure    Technically successful CT-guided core biopsy, left pelvic mass      06/01/2017 Pathology Results    Soft tissue mass, biopsy, Left Pelvic Mass - MALIGNANT SPINDLE CELL PROLIFERATION. - SEE COMMENT. Microscopic Comment The biopsy fragments reveal an atypical spindle cell proliferation. The lesion shows increased cellularity around large caliber blood vessels. There is moderate cytologic atypia as well as mitoses. The tumor cells are positive for CD34, desmin, S100, and focally positive for p63. They are negative for smooth muscle myosin, smooth muscle actin, CD99, CD31, CD117, calponin, EMA, cytokeratin 8/18 and cytokeratin AE1/AE3. The immunohistochemical profile is non-specific; however, the differential diagnosis includes a sarcoma such as a malignant peripheral nerve sheath tumor. Dr. Claudette Laws has reviewed the case and is in essential agreement with this interpretation. (JBK:ah 10/30-31/18)      07/06/2017 -  Radiation Therapy    She received preoperative radiation therapy at [facility]: Please see below for problem oriented charting. She returns with her husband for further follow-up She has lost some weight but claims she has good appetite She has less pain in her pelvic region Leg swelling is less She is motivated to exercise on a regular basis. Denies recent infection, fever or chills Her bowel habits has been regular  REVIEW OF SYSTEMS:   Constitutional: Denies fevers, chills or abnormal weight loss Eyes: Denies blurriness of vision Ears, nose, mouth, throat, and face: Denies mucositis or sore throat Respiratory: Denies cough, dyspnea or wheezes Cardiovascular: Denies palpitation, chest discomfort  Gastrointestinal:  Denies nausea, heartburn or change in bowel habits Skin: Denies abnormal skin rashes Lymphatics: Denies new lymphadenopathy or easy bruising Neurological:Denies numbness, tingling or new weaknesses Behavioral/Psych: Mood is stable, no new changes  All other systems were reviewed with the patient and are negative.  I have reviewed the past medical history, past surgical history, social history and family history with the patient and they are unchanged from previous note.  ALLERGIES:  has No Known Allergies.  MEDICATIONS:  Current Outpatient Medications  Medication Sig Dispense Refill  . cyclobenzaprine (FLEXERIL) 10 MG tablet Take 1 tablet (10 mg total) 3 (three) times daily as needed by mouth for muscle spasms. 30 tablet 0  . feeding supplement, ENSURE ENLIVE, (ENSURE ENLIVE) LIQD Take 237 mLs by mouth 2 (two) times daily between meals. 237 mL 12  . gabapentin (NEURONTIN) 300 MG capsule Take 1 capsule (300 mg total) by mouth 3 (three) times daily. 90 capsule 1  .  HYDROcodone-acetaminophen (NORCO/VICODIN) 5-325 MG tablet Take 1 tablet by mouth every 6 (six) hours as needed. 90 tablet 0  . morphine (MS CONTIN) 15 MG 12 hr tablet  Take 1 tablet (15 mg total) by mouth every 12 (twelve) hours. 60 tablet 0  . senna (SENOKOT) 8.6 MG TABS tablet Take 1 tablet (8.6 mg total) 2 (two) times daily by mouth. 120 each 0  . simvastatin (ZOCOR) 10 MG tablet Take 1 tablet (10 mg total) by mouth at bedtime. 30 tablet 1   No current facility-administered medications for this visit.     PHYSICAL EXAMINATION: ECOG PERFORMANCE STATUS: 1 - Symptomatic but completely ambulatory  Vitals:   08/14/17 1309  BP: (!) 140/91  Pulse: (!) 104  Resp: 18  Temp: 98.6 F (37 C)  SpO2: 100%   Filed Weights   08/14/17 1309  Weight: 116 lb 3.2 oz (52.7 kg)    GENERAL:alert, no distress and comfortable SKIN: skin color, texture, turgor are normal, no rashes or significant lesions EYES: normal, Conjunctiva are pink and non-injected, sclera clear OROPHARYNX:no exudate, no erythema and lips, buccal mucosa, and tongue normal  NECK: supple, thyroid normal size, non-tender, without nodularity LYMPH:  no palpable lymphadenopathy in the cervical, axillary or inguinal LUNGS: clear to auscultation and percussion with normal breathing effort HEART: regular rate & rhythm and no murmurs with minimum left lower extremity edema ABDOMEN:abdomen soft, non-tender and normal bowel sounds Musculoskeletal:no cyanosis of digits and no clubbing  NEURO: alert & oriented x 3 with fluent speech, no focal motor/sensory deficits  LABORATORY DATA:  I have reviewed the data as listed    Component Value Date/Time   NA 135 (L) 07/24/2017 1357   K 3.9 07/24/2017 1357   CL 99 06/22/2017 0818   CO2 24 07/24/2017 1357   GLUCOSE 141 (H) 07/24/2017 1357   BUN 10.4 07/24/2017 1357   CREATININE 1.2 (H) 07/24/2017 1357   CALCIUM 9.2 07/24/2017 1357   PROT 6.7 07/24/2017 1357   ALBUMIN 3.3 (L) 07/24/2017 1357   AST 12 07/24/2017 1357   ALT 22 07/24/2017 1357   ALKPHOS 71 07/24/2017 1357   BILITOT 0.39 07/24/2017 1357   GFRNONAA >60 06/12/2017 0438   GFRAA >60  06/12/2017 0438    No results found for: SPEP, UPEP  Lab Results  Component Value Date   WBC 7.8 07/24/2017   NEUTROABS 6.3 07/24/2017   HGB 11.7 07/24/2017   HCT 34.4 (L) 07/24/2017   MCV 90.1 07/24/2017   PLT 117 Large platelets present (L) 07/24/2017      Chemistry      Component Value Date/Time   NA 135 (L) 07/24/2017 1357   K 3.9 07/24/2017 1357   CL 99 06/22/2017 0818   CO2 24 07/24/2017 1357   BUN 10.4 07/24/2017 1357   CREATININE 1.2 (H) 07/24/2017 1357      Component Value Date/Time   CALCIUM 9.2 07/24/2017 1357   ALKPHOS 71 07/24/2017 1357   AST 12 07/24/2017 1357   ALT 22 07/24/2017 1357   BILITOT 0.39 07/24/2017 1357       ASSESSMENT & PLAN:  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  We will continue pain medicine taper I will reassess pain management in her next  visit  Left leg swelling She has less leg swelling since dexamethasone taper.  Observe only.  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([place]) She has lost some weight since the last time I saw her I recommend increase nutritional supplement as tolerated.   No orders of the defined types were placed in this encounter.  All questions were answered. The patient knows to call the clinic with any problems, questions or concerns. No barriers to learning was detected. I spent 15 minutes counseling the patient face to face. The total time spent in the appointment was 20 minutes and more than 50% was on counseling and review of test results     [name], MD 08/15/2017 10:42 AM

## 2017-08-15 NOTE — Assessment & Plan Note (Signed)
She is needing less pain medicine.  We will continue pain medicine taper I will reassess pain management in her next visit

## 2017-08-15 NOTE — Assessment & Plan Note (Signed)
She has less leg swelling since dexamethasone taper.  Observe only.

## 2017-08-15 NOTE — Assessment & Plan Note (Signed)
She is tolerating radiation therapy well, anticipating last dose on August 17, 2017 I recommend discontinuation of dexamethasone after radiation therapy is completed I will see her back next month for further supportive care

## 2017-08-27 ENCOUNTER — Encounter: Payer: Self-pay | Admitting: Adult Health

## 2017-08-27 ENCOUNTER — Encounter: Payer: Self-pay | Admitting: Hematology and Oncology

## 2017-08-27 ENCOUNTER — Telehealth: Payer: Self-pay | Admitting: *Deleted

## 2017-08-27 NOTE — Telephone Encounter (Signed)
Is taking lisinopril 20 mg daily. She is going to call PCP to discuss BP

## 2017-09-07 ENCOUNTER — Other Ambulatory Visit: Payer: Self-pay | Admitting: Hematology and Oncology

## 2017-09-07 DIAGNOSIS — C414 Malignant neoplasm of pelvic bones, sacrum and coccyx: Secondary | ICD-10-CM

## 2017-09-10 ENCOUNTER — Inpatient Hospital Stay (HOSPITAL_BASED_OUTPATIENT_CLINIC_OR_DEPARTMENT_OTHER): Payer: 59 | Admitting: Hematology and Oncology

## 2017-09-10 ENCOUNTER — Inpatient Hospital Stay: Payer: 59 | Attending: Hematology and Oncology

## 2017-09-10 DIAGNOSIS — C414 Malignant neoplasm of pelvic bones, sacrum and coccyx: Secondary | ICD-10-CM

## 2017-09-10 DIAGNOSIS — R64 Cachexia: Secondary | ICD-10-CM | POA: Diagnosis not present

## 2017-09-10 DIAGNOSIS — G893 Neoplasm related pain (acute) (chronic): Secondary | ICD-10-CM | POA: Diagnosis not present

## 2017-09-10 DIAGNOSIS — I10 Essential (primary) hypertension: Secondary | ICD-10-CM | POA: Diagnosis not present

## 2017-09-10 LAB — COMPREHENSIVE METABOLIC PANEL
ALBUMIN: 3.8 g/dL (ref 3.5–5.0)
ALK PHOS: 73 U/L (ref 40–150)
ALT: 17 U/L (ref 0–55)
ANION GAP: 11 (ref 3–11)
AST: 13 U/L (ref 5–34)
BUN: 14 mg/dL (ref 7–26)
CO2: 23 mmol/L (ref 22–29)
Calcium: 9.8 mg/dL (ref 8.4–10.4)
Chloride: 103 mmol/L (ref 98–109)
Creatinine, Ser: 0.84 mg/dL (ref 0.60–1.10)
GFR calc Af Amer: >60 mL/min (ref >60)
GFR calc non Af Amer: >60 mL/min (ref >60)
Glucose, Bld: 80 mg/dL (ref 70–140)
POTASSIUM: 3.9 mmol/L (ref 3.5–5.1)
SODIUM: 137 mmol/L (ref 136–145)
Total Bilirubin: 0.6 mg/dL (ref 0.2–1.2)
Total Protein: 7 g/dL (ref 6.4–8.3)

## 2017-09-10 LAB — CBC WITH DIFFERENTIAL/PLATELET
Basophils Absolute: 0 10*3/uL (ref 0.0–0.1)
Basophils Relative: 0 %
Eosinophils Absolute: 0 10*3/uL (ref 0.0–0.5)
Eosinophils Relative: 0 %
HEMATOCRIT: 35.4 % (ref 34.8–46.6)
Hemoglobin: 11.6 g/dL (ref 11.6–15.9)
LYMPHS PCT: 11 %
Lymphs Abs: 0.8 10*3/uL — ABNORMAL LOW (ref 0.9–3.3)
MCH: 29.8 pg (ref 25.1–34.0)
MCHC: 32.8 g/dL (ref 31.5–36.0)
MCV: 91 fL (ref 79.5–101.0)
MONOS PCT: 9 %
Monocytes Absolute: 0.6 10*3/uL (ref 0.1–0.9)
NEUTROS ABS: 5.7 10*3/uL (ref 1.5–6.5)
Neutrophils Relative %: 80 %
Platelets: 273 10*3/uL (ref 145–400)
RBC: 3.89 MIL/uL (ref 3.70–5.45)
RDW: 14 % (ref 11.2–14.5)
WBC: 7.2 10*3/uL (ref 3.9–10.3)

## 2017-09-11 ENCOUNTER — Encounter: Payer: Self-pay | Admitting: Hematology and Oncology

## 2017-09-11 NOTE — Assessment & Plan Note (Signed)
The patient has completed radiation treatment She is doing well  She is scheduled for repeat imaging study and surgical evaluation on February 20th. I advised the patient to call me back after surgery is completed for final review of pathology report and to determine whether she would need systemic treatment

## 2017-09-11 NOTE — Progress Notes (Signed)
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 9.6 x 6.3 x 11.9 cm heterogeneous mass within the left pelvis at the level of the left iliacus as above. Primary differential considerations consist of possible sarcomatous tumor, potentially arising from the left iliacus muscle. In neurogenic tumor could also be considered. Further evaluation with histologic sampling recommended, as this lesion would likely be amenable to percutaneous sampling. 2. Degenerative disc bulging and facet arthropathy at L4-5 and L5-S1 with resultant mild canal with moderate left subarticular stenosis. No frank neural impingement within the lumbar spine      05/31/2017 - 06/12/2017 Hospital Admission    She had recurrent admission to the hospital due to severe pain.  She underwent CT-guided biopsy and pain management      06/01/2017 Procedure    Technically successful CT-guided core biopsy, left pelvic mass      06/01/2017 Pathology Results    Soft tissue mass, biopsy, Left Pelvic Mass - MALIGNANT SPINDLE CELL PROLIFERATION. - SEE COMMENT. Microscopic Comment The biopsy fragments reveal an atypical spindle cell proliferation. The lesion shows increased cellularity around large caliber blood vessels. There is moderate cytologic atypia as well as mitoses. The tumor cells are positive for CD34, desmin, S100, and focally positive for p63. They are negative for smooth muscle myosin, smooth muscle actin, CD99, CD31, CD117, calponin, EMA, cytokeratin 8/18 and cytokeratin AE1/AE3. The immunohistochemical profile is non-specific; however, the differential diagnosis includes a sarcoma such as a malignant peripheral nerve sheath tumor. Dr. Claudette Laws has reviewed the case and is in essential agreement with this interpretation. (JBK:ah 10/30-31/18)      07/06/2017 -  Radiation Therapy    She received preoperative radiation therapy at [facility]: Please see below for problem oriented charting. She returns with her husband for further follow-up She has recently completed radiation treatment Her mobility is stable, no recent falls She had minimum pain Her bowel habit is stable She has mild persistent leg swelling and mild progressive weight loss  REVIEW OF SYSTEMS:   Constitutional: Denies fevers, chills Eyes: Denies blurriness of vision Ears, nose, mouth, throat, and face: Denies mucositis or sore throat Respiratory: Denies cough, dyspnea or wheezes Cardiovascular: Denies palpitation, chest discomfort Gastrointestinal:  Denies nausea, heartburn or change in bowel habits Skin: Denies abnormal skin rashes Lymphatics: Denies new lymphadenopathy or easy bruising Neurological:Denies numbness, tingling or new weaknesses Behavioral/Psych: Mood is stable, no new changes  All other systems were reviewed with the patient and are negative.  I have reviewed the past medical history, past surgical history, social history and family history with the patient and they are unchanged from previous note.  ALLERGIES:  has No Known Allergies.  MEDICATIONS:  Current Outpatient Medications  Medication Sig Dispense Refill  . cyclobenzaprine (FLEXERIL) 10 MG tablet Take 1 tablet (10 mg total) 3 (three) times daily as needed by mouth for muscle spasms. 30 tablet 0  . feeding supplement, ENSURE ENLIVE, (ENSURE ENLIVE) LIQD Take 237 mLs by mouth 2 (two) times daily between meals. 237 mL 12  . gabapentin (NEURONTIN) 300 MG capsule Take 1 capsule (300 mg total) by mouth 3 (three) times daily. 90 capsule 1  . HYDROcodone-acetaminophen (NORCO/VICODIN) 5-325 MG tablet Take 1 tablet by mouth every 6 (six) hours  as needed. 90 tablet 0  . lisinopril (PRINIVIL,ZESTRIL) 20 MG tablet   0  . morphine (MS CONTIN) 15 MG 12 hr tablet Take 1 tablet  (15 mg total) by mouth every 12 (twelve) hours. 60 tablet 0  . senna (SENOKOT) 8.6 MG TABS tablet Take 1 tablet (8.6 mg total) 2 (two) times daily by mouth. 120 each 0  . simvastatin (ZOCOR) 10 MG tablet Take 1 tablet (10 mg total) by mouth at bedtime. 30 tablet 1   No current facility-administered medications for this visit.     PHYSICAL EXAMINATION: ECOG PERFORMANCE STATUS: 1 - Symptomatic but completely ambulatory  Vitals:   09/10/17 0936  BP: (!) 143/96  Pulse: 97  Resp: 18  Temp: 98 F (36.7 C)  SpO2: 100%   Filed Weights   09/10/17 0936  Weight: 115 lb 6.4 oz (52.3 kg)    GENERAL:alert, no distress and comfortable SKIN: skin color, texture, turgor are normal, no rashes or significant lesions EYES: normal, Conjunctiva are pink and non-injected, sclera clear NEURO: alert & oriented x 3 with fluent speech, no focal motor/sensory deficits  LABORATORY DATA:  I have reviewed the data as listed    Component Value Date/Time   NA 137 09/10/2017 0907   NA 135 (L) 07/24/2017 1357   K 3.9 09/10/2017 0907   K 3.9 07/24/2017 1357   CL 103 09/10/2017 0907   CO2 23 09/10/2017 0907   CO2 24 07/24/2017 1357   GLUCOSE 80 09/10/2017 0907   GLUCOSE 141 (H) 07/24/2017 1357   BUN 14 09/10/2017 0907   BUN 10.4 07/24/2017 1357   CREATININE 0.84 09/10/2017 0907   CREATININE 1.2 (H) 07/24/2017 1357   CALCIUM 9.8 09/10/2017 0907   CALCIUM 9.2 07/24/2017 1357   PROT 7.0 09/10/2017 0907   PROT 6.7 07/24/2017 1357   ALBUMIN 3.8 09/10/2017 0907   ALBUMIN 3.3 (L) 07/24/2017 1357   AST 13 09/10/2017 0907   AST 12 07/24/2017 1357   ALT 17 09/10/2017 0907   ALT 22 07/24/2017 1357   ALKPHOS 73 09/10/2017 0907   ALKPHOS 71 07/24/2017 1357   BILITOT 0.6 09/10/2017 0907   BILITOT 0.39 07/24/2017 1357   GFRNONAA >60 09/10/2017 0907   GFRAA >60 09/10/2017 0907    No results found for: SPEP, UPEP  Lab Results  Component Value Date   WBC 7.2 09/10/2017   NEUTROABS 5.7 09/10/2017   HGB  11.6 09/10/2017   HCT 35.4 09/10/2017   MCV 91.0 09/10/2017   PLT 273 09/10/2017      Chemistry      Component Value Date/Time   NA 137 09/10/2017 0907   NA 135 (L) 07/24/2017 1357   K 3.9 09/10/2017 0907   K 3.9 07/24/2017 1357   CL 103 09/10/2017 0907   CO2 23 09/10/2017 0907   CO2 24 07/24/2017 1357   BUN 14 09/10/2017 0907   BUN 10.4 07/24/2017 1357   CREATININE 0.84 09/10/2017 0907   CREATININE 1.2 (H) 07/24/2017 1357      Component Value Date/Time   CALCIUM 9.8 09/10/2017 0907   CALCIUM 9.2 07/24/2017 1357   ALKPHOS 73 09/10/2017 0907   ALKPHOS 71 07/24/2017 1357   AST 13 09/10/2017 0907   AST 12 07/24/2017 1357   ALT 17 09/10/2017 0907   ALT 22 07/24/2017 1357   BILITOT 0.6 09/10/2017 0907   BILITOT 0.39 07/24/2017 1357      ASSESSMENT & PLAN:  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 I advised the patient to call me back after surgery is completed for final review of pathology report and to determine whether she would need systemic treatment  Cancer associated pain She is needing less pain medicine.   She is not using much of her prescription pain medicine.  Recommend taper as tolerated  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([place]) The patient continues to have gradual weight loss We discussed the importance of adequate nutrition and additional supplement prior to surgery  Essential hypertension Blood pressure control is stable.  Continue close monitoring with low-dose blood pressure medicine   No orders of the defined types were placed in this encounter.  All questions were answered. The patient knows to call the clinic with any problems, questions or concerns. No barriers to learning was detected. I spent 15 minutes counseling the patient face to face. The total time spent in the appointment was 20 minutes and more than 50% was on  counseling and review of test results     [name], MD 09/11/2017 12:28 PM

## 2017-09-11 NOTE — Assessment & Plan Note (Signed)
The patient continues to have gradual weight loss We discussed the importance of adequate nutrition and additional supplement prior to surgery

## 2017-09-11 NOTE — Assessment & Plan Note (Signed)
She is needing less pain medicine.   She is not using much of her prescription pain medicine.  Recommend taper as tolerated

## 2017-09-11 NOTE — Assessment & Plan Note (Signed)
Blood pressure control is stable.  Continue close monitoring with low-dose blood pressure medicine

## 2017-10-18 ENCOUNTER — Other Ambulatory Visit: Payer: Self-pay | Admitting: Hematology and Oncology

## 2017-10-18 ENCOUNTER — Encounter: Payer: Self-pay | Admitting: Hematology and Oncology

## 2017-10-18 MED ORDER — HYDROCODONE-ACETAMINOPHEN 5-325 MG PO TABS: 1.0000 | ORAL_TABLET | Freq: Four times a day (QID) | ORAL | 0 refills | Status: DC | PRN

## 2017-10-19 ENCOUNTER — Other Ambulatory Visit: Payer: Self-pay | Admitting: Hematology and Oncology

## 2017-10-19 ENCOUNTER — Inpatient Hospital Stay: Payer: 59

## 2017-10-19 ENCOUNTER — Telehealth: Payer: Self-pay

## 2017-10-19 ENCOUNTER — Encounter: Payer: Self-pay | Admitting: Hematology and Oncology

## 2017-10-19 ENCOUNTER — Inpatient Hospital Stay: Payer: 59 | Attending: Hematology and Oncology | Admitting: Hematology and Oncology

## 2017-10-19 VITALS — BP 128/94

## 2017-10-19 DIAGNOSIS — G893 Neoplasm related pain (acute) (chronic): Secondary | ICD-10-CM

## 2017-10-19 DIAGNOSIS — C414 Malignant neoplasm of pelvic bones, sacrum and coccyx: Secondary | ICD-10-CM

## 2017-10-19 DIAGNOSIS — E860 Dehydration: Secondary | ICD-10-CM

## 2017-10-19 DIAGNOSIS — R64 Cachexia: Secondary | ICD-10-CM

## 2017-10-19 DIAGNOSIS — R112 Nausea with vomiting, unspecified: Secondary | ICD-10-CM

## 2017-10-19 LAB — CBC WITH DIFFERENTIAL/PLATELET
BASOS ABS: 0 10*3/uL (ref 0.0–0.1)
Basophils Relative: 0 %
EOS ABS: 0.1 10*3/uL (ref 0.0–0.5)
EOS PCT: 1 %
HCT: 37.8 % (ref 34.8–46.6)
Hemoglobin: 12.5 g/dL (ref 11.6–15.9)
Lymphocytes Relative: 7 %
Lymphs Abs: 0.7 10*3/uL — ABNORMAL LOW (ref 0.9–3.3)
MCH: 29.3 pg (ref 25.1–34.0)
MCHC: 33.1 g/dL (ref 31.5–36.0)
MCV: 88.5 fL (ref 79.5–101.0)
Monocytes Absolute: 0.7 10*3/uL (ref 0.1–0.9)
Monocytes Relative: 7 %
Neutro Abs: 9.5 10*3/uL — ABNORMAL HIGH (ref 1.5–6.5)
Neutrophils Relative %: 85 %
PLATELETS: 322 10*3/uL (ref 145–400)
RBC: 4.27 MIL/uL (ref 3.70–5.45)
RDW: 13.4 % (ref 11.2–14.5)
WBC: 11 10*3/uL — AB (ref 3.9–10.3)

## 2017-10-19 LAB — COMPREHENSIVE METABOLIC PANEL
ALT: 10 U/L (ref 0–55)
AST: 14 U/L (ref 5–34)
Albumin: 3.7 g/dL (ref 3.5–5.0)
Alkaline Phosphatase: 80 U/L (ref 40–150)
Anion gap: 10 (ref 3–11)
BUN: 16 mg/dL (ref 7–26)
CHLORIDE: 99 mmol/L (ref 98–109)
CO2: 26 mmol/L (ref 22–29)
CREATININE: 0.97 mg/dL (ref 0.60–1.10)
Calcium: 10.4 mg/dL (ref 8.4–10.4)
GFR calc non Af Amer: >60 mL/min (ref >60)
Glucose, Bld: 121 mg/dL (ref 70–140)
POTASSIUM: 4.1 mmol/L (ref 3.5–5.1)
SODIUM: 135 mmol/L — AB (ref 136–145)
Total Bilirubin: 0.3 mg/dL (ref 0.2–1.2)
Total Protein: 8 g/dL (ref 6.4–8.3)

## 2017-10-19 LAB — URINALYSIS, COMPLETE (UACMP) WITH MICROSCOPIC
BILIRUBIN URINE: NEGATIVE
GLUCOSE, UA: NEGATIVE mg/dL
HGB URINE DIPSTICK: NEGATIVE
KETONES UR: NEGATIVE mg/dL
LEUKOCYTES UA: NEGATIVE
Nitrite: NEGATIVE
PROTEIN: NEGATIVE mg/dL
Specific Gravity, Urine: 1.006 (ref 1.005–1.030)
pH: 5 (ref 5.0–8.0)

## 2017-10-19 LAB — LACTATE DEHYDROGENASE: LDH: 241 U/L (ref 125–245)

## 2017-10-19 MED ORDER — SODIUM CHLORIDE 0.9 % IV SOLN
Freq: Once | INTRAVENOUS | Status: AC
  Administered 2017-10-19: 15:00:00 via INTRAVENOUS

## 2017-10-19 MED ORDER — ONDANSETRON HCL 8 MG PO TABS: 8.0000 mg | ORAL_TABLET | Freq: Three times a day (TID) | ORAL | 3 refills | Status: AC | PRN

## 2017-10-19 MED ORDER — PROCHLORPERAZINE MALEATE 10 MG PO TABS: 10.0000 mg | ORAL_TABLET | Freq: Four times a day (QID) | ORAL | 1 refills | Status: AC | PRN

## 2017-10-19 MED ORDER — PROMETHAZINE HCL 25 MG/ML IJ SOLN: 25.0000 mg | Freq: Once | INTRAMUSCULAR | Status: DC

## 2017-10-19 MED ORDER — SODIUM CHLORIDE 0.9 % IV SOLN
Freq: Once | INTRAVENOUS | Status: AC
  Administered 2017-10-19: 16:00:00 via INTRAVENOUS
  Filled 2017-10-19: qty 1

## 2017-10-19 NOTE — Assessment & Plan Note (Signed)
She has new onset of nausea vomiting It could be due to gastritis due to frequent ibuprofen intake I recommend avoid NSAID I have prescribed some antiemetics for her to take as needed

## 2017-10-19 NOTE — Progress Notes (Signed)
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[place]) I have reviewed her outside CT report The reported size of her pelvic mass was 13-1/2 cm Her MRI from last year was approximately 10-1/2 cm We discussed difficulties with cross comparison that may be not ideal In any case, she is having planned surgery next month In the meantime, I recommend we continue on aggressive supportive care  Cancer associated pain She has worsening pain recently I have recently refilled her prescription pain medicine Recommend she continue same  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([place]) She has lost some weight I recommend low-dose dexamethasone as appetite stimulant I encouraged her to increase oral intake as tolerated  Dehydration She is clinically dehydrated with tachycardia, nausea and feel thirsty I recommend IV fluid hydration daily for 2 days along with antiemetics as needed  Nausea with vomiting She has new onset of nausea vomiting It could be due to gastritis due to frequent ibuprofen intake I recommend avoid NSAID I have prescribed some antiemetics for her to take as needed   No orders of the defined types were placed in this encounter.   INTERVAL HISTORY: Please see below for problem oriented charting. 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 9.6 x 6.3 x 11.9 cm heterogeneous mass  within the left pelvis at the level of the left iliacus as above. Primary differential considerations consist of possible sarcomatous tumor, potentially arising from the left iliacus muscle. In neurogenic tumor could also be considered. Further evaluation with histologic sampling recommended, as this lesion would likely be amenable to percutaneous sampling. 2. Degenerative disc bulging and facet arthropathy at L4-5 and L5-S1 with resultant mild canal with moderate left subarticular stenosis. No frank neural impingement within the lumbar spine      05/31/2017 - 06/12/2017 Hospital Admission    She had recurrent admission to the hospital due to severe pain.  She underwent CT-guided biopsy and pain management      06/01/2017 Procedure    Technically successful CT-guided core biopsy, left pelvic mass      06/01/2017 Pathology Results    Soft tissue mass, biopsy, Left Pelvic Mass - MALIGNANT SPINDLE CELL PROLIFERATION. - SEE COMMENT. Microscopic Comment The biopsy fragments reveal an atypical spindle cell proliferation. The lesion shows increased cellularity around large caliber blood vessels. There is moderate cytologic atypia as well as mitoses. The tumor cells are positive for CD34, desmin, S100, and focally positive for p63. They are negative for smooth muscle myosin, smooth muscle actin, CD99, CD31, CD117, calponin, EMA, cytokeratin 8/18 and cytokeratin AE1/AE3. The immunohistochemical profile is non-specific; however, the differential diagnosis includes a sarcoma such as a malignant peripheral nerve sheath tumor. Dr. Claudette Laws has reviewed the case and is in essential agreement with this interpretation. (JBK:ah 10/30-31/18)      07/06/2017 -  Radiation Therapy    She received preoperative  radiation therapy at [facility]        REVIEW OF SYSTEMS:   Constitutional: Denies fevers, chills or abnormal weight loss Eyes: Denies blurriness of vision Ears, nose, mouth, throat, and face: Denies  mucositis or sore throat Respiratory: Denies cough, dyspnea or wheezes Cardiovascular: Denies palpitation, chest discomfort or lower extremity swelling Gastrointestinal:  Denies nausea, heartburn or change in bowel habits Skin: Denies abnormal skin rashes Lymphatics: Denies new lymphadenopathy or easy bruising Neurological:Denies numbness, tingling or new weaknesses Behavioral/Psych: Mood is stable, no new changes  All other systems were reviewed with the patient and are negative.  I have reviewed the past medical history, past surgical history, social history and family history with the patient and they are unchanged from previous note.  ALLERGIES:  has No Known Allergies.  MEDICATIONS:  Current Outpatient Medications  Medication Sig Dispense Refill  . cyclobenzaprine (FLEXERIL) 10 MG tablet Take 1 tablet (10 mg total) 3 (three) times daily as needed by mouth for muscle spasms. 30 tablet 0  . feeding supplement, ENSURE ENLIVE, (ENSURE ENLIVE) LIQD Take 237 mLs by mouth 2 (two) times daily between meals. 237 mL 12  . gabapentin (NEURONTIN) 300 MG capsule Take 1 capsule (300 mg total) by mouth 3 (three) times daily. 90 capsule 1  . HYDROcodone-acetaminophen (NORCO/VICODIN) 5-325 MG tablet Take 1 tablet by mouth every 6 (six) hours as needed. 90 tablet 0  . lisinopril (PRINIVIL,ZESTRIL) 20 MG tablet   0  . morphine (MS CONTIN) 15 MG 12 hr tablet Take 1 tablet (15 mg total) by mouth every 12 (twelve) hours. 60 tablet 0  . ondansetron (ZOFRAN) 8 MG tablet Take 1 tablet (8 mg total) by mouth every 8 (eight) hours as needed for nausea. 30 tablet 3  . prochlorperazine (COMPAZINE) 10 MG tablet Take 1 tablet (10 mg total) by mouth every 6 (six) hours as needed for nausea or vomiting. 30 tablet 1  . senna (SENOKOT) 8.6 MG TABS tablet Take 1 tablet (8.6 mg total) 2 (two) times daily by mouth. 120 each 0  . simvastatin (ZOCOR) 10 MG tablet Take 1 tablet (10 mg total) by mouth at bedtime. 30 tablet 1    No current facility-administered medications for this visit.     PHYSICAL EXAMINATION: ECOG PERFORMANCE STATUS: 2 - Symptomatic, <50% confined to bed  Vitals:   10/19/17 1430  BP: (!) 135/105  Pulse: (!) 121  Resp: 18  Temp: 98 F (36.7 C)  SpO2: 100%   Filed Weights   10/19/17 1430  Weight: 113 lb 3.2 oz (51.3 kg)    GENERAL:alert, no distress and comfortable.  She looks thin and cachectic SKIN: skin color, texture, turgor are normal, no rashes or significant lesions EYES: normal, Conjunctiva are pink and non-injected, sclera clear OROPHARYNX:no exudate, no erythema and lips, buccal mucosa, and tongue normal  NECK: supple, thyroid normal size, non-tender, without nodularity LYMPH:  no palpable lymphadenopathy in the cervical, axillary or inguinal LUNGS: clear to auscultation and percussion with normal breathing effort HEART: regular rate & rhythm and no murmurs and no lower extremity edema ABDOMEN:abdomen soft, non-tender and normal bowel sounds Musculoskeletal:no cyanosis of digits and no clubbing  NEURO: alert & oriented x 3 with fluent speech, no focal motor/sensory deficits  LABORATORY DATA:  I have reviewed the data as listed    Component Value Date/Time   NA 135 (L) 10/19/2017 1354   NA 135 (L) 07/24/2017 1357   K 4.1 10/19/2017 1354   K 3.9 07/24/2017 1357  CL 99 10/19/2017 1354   CO2 26 10/19/2017 1354   CO2 24 07/24/2017 1357   GLUCOSE 121 10/19/2017 1354   GLUCOSE 141 (H) 07/24/2017 1357   BUN 16 10/19/2017 1354   BUN 10.4 07/24/2017 1357   CREATININE 0.97 10/19/2017 1354   CREATININE 1.2 (H) 07/24/2017 1357   CALCIUM 10.4 10/19/2017 1354   CALCIUM 9.2 07/24/2017 1357   PROT 8.0 10/19/2017 1354   PROT 6.7 07/24/2017 1357   ALBUMIN 3.7 10/19/2017 1354   ALBUMIN 3.3 (L) 07/24/2017 1357   AST 14 10/19/2017 1354   AST 12 07/24/2017 1357   ALT 10 10/19/2017 1354   ALT 22 07/24/2017 1357   ALKPHOS 80 10/19/2017 1354   ALKPHOS 71 07/24/2017 1357    BILITOT 0.3 10/19/2017 1354   BILITOT 0.39 07/24/2017 1357   GFRNONAA >60 10/19/2017 1354   GFRAA >60 10/19/2017 1354    No results found for: SPEP, UPEP  Lab Results  Component Value Date   WBC 11.0 (H) 10/19/2017   NEUTROABS 9.5 (H) 10/19/2017   HGB 12.5 10/19/2017   HCT 37.8 10/19/2017   MCV 88.5 10/19/2017   PLT 322 10/19/2017      Chemistry      Component Value Date/Time   NA 135 (L) 10/19/2017 1354   NA 135 (L) 07/24/2017 1357   K 4.1 10/19/2017 1354   K 3.9 07/24/2017 1357   CL 99 10/19/2017 1354   CO2 26 10/19/2017 1354   CO2 24 07/24/2017 1357   BUN 16 10/19/2017 1354   BUN 10.4 07/24/2017 1357   CREATININE 0.97 10/19/2017 1354   CREATININE 1.2 (H) 07/24/2017 1357      Component Value Date/Time   CALCIUM 10.4 10/19/2017 1354   CALCIUM 9.2 07/24/2017 1357   ALKPHOS 80 10/19/2017 1354   ALKPHOS 71 07/24/2017 1357   AST 14 10/19/2017 1354   AST 12 07/24/2017 1357   ALT 10 10/19/2017 1354   ALT 22 07/24/2017 1357   BILITOT 0.3 10/19/2017 1354   BILITOT 0.39 07/24/2017 1357      All questions were answered. The patient knows to call the clinic with any problems, questions or concerns. No barriers to learning was detected.  I spent 25 minutes counseling the patient face to face. The total time spent in the appointment was 30 minutes and more than 50% was on counseling and review of test results  [name], MD 10/19/2017 2:58 PM

## 2017-10-19 NOTE — Telephone Encounter (Signed)
-----   Message from [name], MD sent at 10/19/2017  2:54 PM EDT ----- Regarding: labs ok Labs ok Urine culture pending pls let her know Will call her next week for final urine culture

## 2017-10-19 NOTE — Telephone Encounter (Signed)
Called nurse with below message, she will give patient message.

## 2017-10-19 NOTE — Assessment & Plan Note (Signed)
She has lost some weight I recommend low-dose dexamethasone as appetite stimulant I encouraged her to increase oral intake as tolerated

## 2017-10-19 NOTE — Assessment & Plan Note (Signed)
She is clinically dehydrated with tachycardia, nausea and feel thirsty I recommend IV fluid hydration daily for 2 days along with antiemetics as needed

## 2017-10-19 NOTE — Assessment & Plan Note (Signed)
She has worsening pain recently I have recently refilled her prescription pain medicine Recommend she continue same

## 2017-10-19 NOTE — Assessment & Plan Note (Signed)
I have reviewed her outside CT report The reported size of her pelvic mass was 13-1/2 cm Her MRI from last year was approximately 10-1/2 cm We discussed difficulties with cross comparison that may be not ideal In any case, she is having planned surgery next month In the meantime, I recommend we continue on aggressive supportive care

## 2017-10-20 ENCOUNTER — Encounter: Payer: Self-pay | Admitting: Adult Health

## 2017-10-20 ENCOUNTER — Inpatient Hospital Stay: Payer: 59

## 2017-10-20 VITALS — BP 159/97 | HR 110 | Temp 98.7°F | Resp 18

## 2017-10-20 DIAGNOSIS — C414 Malignant neoplasm of pelvic bones, sacrum and coccyx: Secondary | ICD-10-CM

## 2017-10-20 DIAGNOSIS — E860 Dehydration: Secondary | ICD-10-CM

## 2017-10-20 DIAGNOSIS — R112 Nausea with vomiting, unspecified: Secondary | ICD-10-CM | POA: Diagnosis not present

## 2017-10-20 MED ORDER — SODIUM CHLORIDE 0.9 % IV SOLN
Freq: Once | INTRAVENOUS | Status: AC
  Administered 2017-10-20: 09:00:00 via INTRAVENOUS

## 2017-10-20 NOTE — Patient Instructions (Signed)
Dehydration, Adult Dehydration is when there is not enough fluid or water in your body. This happens when you lose more fluids than you take in. Dehydration can range from mild to very bad. It should be treated right away to keep it from getting very bad. Symptoms of mild dehydration may include:  Thirst.  Dry lips.  Slightly dry mouth.  Dry, warm skin.  Dizziness. Symptoms of moderate dehydration may include:  Very dry mouth.  Muscle cramps.  Dark pee (urine). Pee may be the color of tea.  Your body making less pee.  Your eyes making fewer tears.  Heartbeat that is uneven or faster than normal (palpitations).  Headache.  Light-headedness, especially when you stand up from sitting.  Fainting (syncope). Symptoms of very bad dehydration may include:  Changes in skin, such as: ? Cold and clammy skin. ? Blotchy (mottled) or pale skin. ? Skin that does not quickly return to normal after being lightly pinched and let go (poor skin turgor).  Changes in body fluids, such as: ? Feeling very thirsty. ? Your eyes making fewer tears. ? Not sweating when body temperature is high, such as in hot weather. ? Your body making very little pee.  Changes in vital signs, such as: ? Weak pulse. ? Pulse that is more than 100 beats a minute when you are sitting still. ? Fast breathing. ? Low blood pressure.  Other changes, such as: ? Sunken eyes. ? Cold hands and feet. ? Confusion. ? Lack of energy (lethargy). ? Trouble waking up from sleep. ? Short-term weight loss. ? Unconsciousness. Follow these instructions at home:  If told by your doctor, drink an ORS: ? Make an ORS by using instructions on the package. ? Start by drinking small amounts, about  cup (120 mL) every 5-10 minutes. ? Slowly drink more until you have had the amount that your doctor said to have.  Drink enough clear fluid to keep your pee clear or pale yellow. If you were told to drink an ORS, finish the ORS  first, then start slowly drinking clear fluids. Drink fluids such as: ? Water. Do not drink only water by itself. Doing that can make the salt (sodium) level in your body get too low (hyponatremia). ? Ice chips. ? Fruit juice that you have added water to (diluted). ? Low-calorie sports drinks.  Avoid: ? Alcohol. ? Drinks that have a lot of sugar. These include high-calorie sports drinks, fruit juice that does not have water added, and soda. ? Caffeine. ? Foods that are greasy or have a lot of fat or sugar.  Take over-the-counter and prescription medicines only as told by your doctor.  Do not take salt tablets. Doing that can make the salt level in your body get too high (hypernatremia).  Eat foods that have minerals (electrolytes). Examples include bananas, oranges, potatoes, tomatoes, and spinach.  Keep all follow-up visits as told by your doctor. This is important. Contact a doctor if:  You have belly (abdominal) pain that: ? Gets worse. ? Stays in one area (localizes).  You have a rash.  You have a stiff neck.  You get angry or annoyed more easily than normal (irritability).  You are more sleepy than normal.  You have a harder time waking up than normal.  You feel: ? Weak. ? Dizzy. ? Very thirsty.  You have peed (urinated) only a small amount of very dark pee during 6-8 hours. Get help right away if:  You have symptoms of   very bad dehydration.  You cannot drink fluids without throwing up (vomiting).  Your symptoms get worse with treatment.  You have a fever.  You have a very bad headache.  You are throwing up or having watery poop (diarrhea) and it: ? Gets worse. ? Does not go away.  You have blood or something green (bile) in your throw-up.  You have blood in your poop (stool). This may cause poop to look black and tarry.  You have not peed in 6-8 hours.  You pass out (faint).  Your heart rate when you are sitting still is more than 100 beats a  minute.  You have trouble breathing. This information is not intended to replace advice given to you by your health care provider. Make sure you discuss any questions you have with your health care provider. Document Released: 05/20/2009 Document Revised: 02/11/2016 Document Reviewed: 09/17/2015 Elsevier Interactive Patient Education  2018 [employer]vier Inc.  

## 2017-10-21 LAB — URINE CULTURE

## 2017-10-22 ENCOUNTER — Telehealth: Payer: Self-pay

## 2017-10-22 NOTE — Telephone Encounter (Signed)
Called and given below message. States that she feels wonderful and is eating now. She wants to think Dr. Alvy Bimler for all that she does.

## 2017-10-22 NOTE — Telephone Encounter (Signed)
-----   Message from [name], MD sent at 10/22/2017  8:48 AM EDT ----- Regarding: urine culture neg Can you call and ask how she is doing? Urine culture is neg ----- Message ----- From: Interface, Lab In [place] Sent: 10/19/2017   2:14 PM To: [name], MD

## 2017-11-13 HISTORY — PX: RESECTION OF ABDOMINAL MASS: SHX6450

## 2017-11-21 ENCOUNTER — Encounter (INDEPENDENT_AMBULATORY_CARE_PROVIDER_SITE_OTHER): Payer: Self-pay | Admitting: Surgery

## 2017-11-26 ENCOUNTER — Encounter: Payer: Self-pay | Admitting: Hematology and Oncology

## 2017-11-26 ENCOUNTER — Telehealth: Payer: Self-pay | Admitting: Adult Health

## 2017-11-26 MED ORDER — ONDANSETRON 4 MG PO TBDP
8.00 | ORAL_TABLET | ORAL | Status: DC
Start: ? — End: 2017-11-26

## 2017-11-26 MED ORDER — METOCLOPRAMIDE HCL 10 MG PO TABS
10.00 | ORAL_TABLET | ORAL | Status: DC
Start: 2017-11-25 — End: 2017-11-26

## 2017-11-26 MED ORDER — CARVEDILOL 12.5 MG PO TABS
12.50 | ORAL_TABLET | ORAL | Status: DC
Start: 2017-11-25 — End: 2017-11-26

## 2017-11-26 MED ORDER — ACETAMINOPHEN 500 MG PO TABS
1000.00 | ORAL_TABLET | ORAL | Status: DC
Start: 2017-11-25 — End: 2017-11-26

## 2017-11-26 MED ORDER — PROMETHAZINE HCL 25 MG PO TABS
25.00 | ORAL_TABLET | ORAL | Status: DC
Start: ? — End: 2017-11-26

## 2017-11-26 MED ORDER — GENERIC EXTERNAL MEDICATION
1.00 | Status: DC
Start: 2017-11-26 — End: 2017-11-26

## 2017-11-26 MED ORDER — ENOXAPARIN SODIUM 40 MG/0.4ML ~~LOC~~ SOLN
40.00 | SUBCUTANEOUS | Status: DC
Start: 2017-11-26 — End: 2017-11-26

## 2017-11-26 MED ORDER — OXYCODONE HCL 5 MG PO TABS
5.00 | ORAL_TABLET | ORAL | Status: DC
Start: ? — End: 2017-11-26

## 2017-11-26 MED ORDER — LOPERAMIDE HCL 2 MG PO CAPS
2.00 | ORAL_CAPSULE | ORAL | Status: DC
Start: ? — End: 2017-11-26

## 2017-11-26 MED ORDER — LABETALOL HCL 5 MG/ML IV SOLN
10.00 | INTRAVENOUS | Status: DC
Start: ? — End: 2017-11-26

## 2017-11-26 NOTE — Telephone Encounter (Unsigned)
Copied from [place] [phone]. Topic: Quick Communication - See Telephone Encounter >> Nov 26, 2017  9:21 AM Percell Belt A wrote: CRM for notification. See Telephone encounter for: 11/26/17.  Pt called in and said that she just had surgery at [facility].  They told her call her prim and have them check her sodium level. She said it was low before the surgery.  Can this order be put in and the result sent to Dr Ronnette Hila at [place]?

## 2017-11-26 NOTE — Telephone Encounter (Signed)
Discharge summary available via Care Everywhere.  Patient also sent MyChart message to her oncologist requesting to have labs drawn there.

## 2017-12-03 ENCOUNTER — Telehealth: Payer: Self-pay | Admitting: Hematology and Oncology

## 2017-12-03 ENCOUNTER — Inpatient Hospital Stay: Payer: 59 | Attending: Hematology and Oncology | Admitting: Hematology and Oncology

## 2017-12-03 ENCOUNTER — Encounter: Payer: Self-pay | Admitting: Hematology and Oncology

## 2017-12-03 DIAGNOSIS — R531 Weakness: Secondary | ICD-10-CM

## 2017-12-03 DIAGNOSIS — R64 Cachexia: Secondary | ICD-10-CM

## 2017-12-03 DIAGNOSIS — R29898 Other symptoms and signs involving the musculoskeletal system: Secondary | ICD-10-CM

## 2017-12-03 DIAGNOSIS — C414 Malignant neoplasm of pelvic bones, sacrum and coccyx: Secondary | ICD-10-CM | POA: Insufficient documentation

## 2017-12-03 DIAGNOSIS — R112 Nausea with vomiting, unspecified: Secondary | ICD-10-CM | POA: Diagnosis present

## 2017-12-03 NOTE — Telephone Encounter (Signed)
Gave patient AVs and calendar of upcoming May appointments.  °

## 2017-12-04 ENCOUNTER — Encounter: Payer: Self-pay | Admitting: Hematology and Oncology

## 2017-12-04 ENCOUNTER — Telehealth: Payer: Self-pay | Admitting: *Deleted

## 2017-12-04 DIAGNOSIS — R29898 Other symptoms and signs involving the musculoskeletal system: Secondary | ICD-10-CM | POA: Insufficient documentation

## 2017-12-04 NOTE — Assessment & Plan Note (Signed)
I have reviewed the general approach to treatment of sarcoma The size of her tumor is quite big and margins were positive I recommend repeat staging CT scan of the chest, abdomen and pelvis within the month of surgery and to consider adjuvant chemotherapy I also recommend consideration for her to return to [facility] to see if there are clinical trials available for her I will call the pathology department at [facility] to add NTRK, c-kit and MSI testing on the tissue sample to see if there are any molecular targets that are available for treatment. If not, she will be prescribed conventional standard chemotherapy It is not clear to me whether she would still benefit from further radiation treatment She has appointment to return to [facility] on Dec 19, 2017 for further follow-up I will see her back after that to discuss test results and further plan of care

## 2017-12-04 NOTE — Telephone Encounter (Signed)
-----  Message from [name], MD sent at 12/04/2017 12:51 PM EDT ----- Regarding: call pathologist [facility] Dr. Gelene Mink from [facility] read her recent surgical specimen [id number]. Can you call and ask him to add the following tests: 1) NTRK mutation 2) C-kit 3) MSI testing

## 2017-12-04 NOTE — Assessment & Plan Note (Signed)
She has persistent left lower leg weakness after surgery We discussed the importance of physical therapy and rehabilitation

## 2017-12-04 NOTE — Telephone Encounter (Signed)
Spoke with pathology at [place] fax to [phone] requesting additional testing listed below.

## 2017-12-04 NOTE — Assessment & Plan Note (Signed)
She had recent weight loss I recommend frequent small meals and dietary supplement as tolerated

## 2017-12-04 NOTE — Progress Notes (Signed)
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   INTERVAL HISTORY: Please see below for problem oriented charting. She returns with family members for further follow-up She is recovering well from surgery Her pain is well controlled She continues to have persistent left lower extremity weakness She has appointment to return to [facility] in 2 weeks She will start outpatient physical therapy soon I have review her outside records related to her surgery, pathology report and others.  She tolerated surgery well except for anemia of which she received transfusion after surgery.  Her  last blood count recently showed improvement. 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 9.6 x 6.3 x 11.9 cm heterogeneous mass within the left pelvis at the level of the left iliacus as above. Primary differential considerations consist of possible sarcomatous tumor, potentially arising from the left iliacus muscle. In neurogenic tumor could also be considered. Further evaluation with histologic sampling recommended, as this lesion would likely be amenable to percutaneous sampling. 2. Degenerative disc bulging and facet arthropathy at L4-5 and L5-S1 with resultant mild canal with moderate left subarticular stenosis. No frank neural impingement within the lumbar spine      05/31/2017 - 06/12/2017 Hospital Admission    She had recurrent admission to the hospital due to severe pain.  She underwent CT-guided biopsy and pain management      06/01/2017 Procedure    Technically successful CT-guided core biopsy, left pelvic mass      06/01/2017 Pathology Results    Soft tissue mass, biopsy, Left Pelvic Mass - MALIGNANT SPINDLE CELL PROLIFERATION. - SEE COMMENT. Microscopic Comment The biopsy fragments reveal an atypical spindle cell proliferation. The lesion shows increased cellularity around large caliber blood vessels. There is moderate cytologic atypia as well as mitoses. The tumor cells are positive for CD34, desmin, S100, and focally positive for p63. They are negative for smooth muscle myosin, smooth muscle actin, CD99, CD31, CD117, calponin, EMA, cytokeratin 8/18 and cytokeratin  AE1/AE3. The immunohistochemical profile is non-specific; however, the differential diagnosis includes a sarcoma such as a malignant peripheral nerve sheath tumor. Dr. Claudette Laws has reviewed the case and is in essential agreement with this interpretation. (JBK:ah  10/30-31/18)      07/06/2017 -  Radiation Therapy    She received preoperative radiation therapy at [facility]       10/17/2017 Imaging    CHEST: .Thoracic inlet/axilla: Within normal limits. .Central airways: Within normal limits. .Mediastinum/hila: Within normal limits.  .Heart/vessel: Normal heart size. No pericardial effusion. Aorta normal in caliber and appearance. No pulmonary embolism.  .Lungs: A linear subpleural nodule at the right lower lobe on series 4 image 132 is unchanged. .Pleura: Within normal limits.   ABDOMEN: .Liver: Within normal limits. .Gallbladder/biliary: Within normal limits. .Spleen: Within normal limits. .Pancreas: Within normal limits. .Adrenals: Within normal limits. .Kidneys: Severe left hydronephrosis with delayed nephrogram. .Peritoneum: Within normal limits. .Mesentery: Within normal limits. .Extraperitoneum: The left psoas muscle is involved by the large extraperitoneal mass which is described below. .GI tract: Nonobstructed. .Vascular: Within normal limits.  PELVIS: .Peritoneum: Within normal limits. .Extraperitoneum: Left pelvic mass measuring up to 13.7 x 6.5 cm transaxial, extending from the level of the left lower kidney, involving the iliopsoas musculature, occupying the left iliac fossa. The mass extends into the left inguinal region as well, involving the proximal rectus femoris muscle.  The left external iliac artery is displaced anteriorly by the mass, while the left internal iliac artery courses posterior to the mass.Marland Kitchen Marland KitchenUreters: Within normal limits. .Bladder: Within normal limits. .Reproductive system: Within normal limits. .Vascular: Vessels surrounding the left pelvic mass described above. Additionally, these left common iliac vein is compressed by the tumor. The left femoral vein is patent, but no assessment is made of the veins within the pelvis.  MSK: .The left pelvic lesion is  closely applied to the iliac wing and there is a focus of likely erosion on series 2 image 172. Otherwise the iliac wing appears intact.      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 The tumor mitotic rate is 10 mitosis/ 10 HPFs. Tumor necrosis is present, representing approximately 60-70% of the tumor. Angiolymphatic invasion is not identified. MPNST involving the distal, lateral, medial and deep surgical margins      11/13/2017 Surgery    She has gross resection of her disease       REVIEW OF SYSTEMS:   Constitutional: Denies fevers, chills  Eyes: Denies blurriness of vision Ears, nose, mouth, throat, and face: Denies mucositis or sore throat Respiratory: Denies cough, dyspnea or wheezes Cardiovascular: Denies palpitation, chest discomfort or lower extremity swelling Gastrointestinal:  Denies nausea, heartburn or change in bowel habits Skin: Denies abnormal skin rashes Lymphatics: Denies new lymphadenopathy or easy bruising Behavioral/Psych: Mood is stable, no new changes  All other systems were reviewed with the patient and are negative.  I have reviewed the past medical history, past surgical history, social history and family history with the patient and they are unchanged from previous note.  ALLERGIES:  has No Known Allergies.  MEDICATIONS:  Current Outpatient Medications  Medication Sig Dispense Refill  . cyclobenzaprine (FLEXERIL) 10 MG tablet Take 1 tablet (10 mg total) 3 (three) times daily as needed by mouth for muscle spasms. 30 tablet 0  . feeding supplement, ENSURE ENLIVE, (ENSURE ENLIVE) LIQD Take 237 mLs  by mouth 2 (two) times daily between meals. 237 mL 12  . gabapentin (NEURONTIN) 300 MG capsule Take 1 capsule (300 mg total) by mouth 3 (three) times daily. 90 capsule 1  . HYDROcodone-acetaminophen (NORCO/VICODIN)  5-325 MG tablet Take 1 tablet by mouth every 6 (six) hours as needed. 90 tablet 0  . lisinopril (PRINIVIL,ZESTRIL) 20 MG tablet   0  . morphine (MS CONTIN) 15 MG 12 hr tablet Take 1 tablet (15 mg total) by mouth every 12 (twelve) hours. 60 tablet 0  . ondansetron (ZOFRAN) 8 MG tablet Take 1 tablet (8 mg total) by mouth every 8 (eight) hours as needed for nausea. 30 tablet 3  . prochlorperazine (COMPAZINE) 10 MG tablet Take 1 tablet (10 mg total) by mouth every 6 (six) hours as needed for nausea or vomiting. 30 tablet 1  . senna (SENOKOT) 8.6 MG TABS tablet Take 1 tablet (8.6 mg total) 2 (two) times daily by mouth. 120 each 0  . simvastatin (ZOCOR) 10 MG tablet Take 1 tablet (10 mg total) by mouth at bedtime. 30 tablet 1   No current facility-administered medications for this visit.     PHYSICAL EXAMINATION: ECOG PERFORMANCE STATUS: 2 - Symptomatic, <50% confined to bed  Vitals:   12/03/17 1503  BP: 110/65  Pulse: (!) 120  Resp: 18  Temp: 97.6 F (36.4 C)  SpO2: 100%   Filed Weights   12/03/17 1503  Weight: 109 lb 9.6 oz (49.7 kg)    GENERAL:alert, no distress and comfortable.  She looks thin and mildly cachectic SKIN: skin color, texture, turgor are normal, no rashes or significant lesions EYES: normal, Conjunctiva are pink and non-injected, sclera clear OROPHARYNX:no exudate, no erythema and lips, buccal mucosa, and tongue normal  NECK: supple, thyroid normal size, non-tender, without nodularity LYMPH:  no palpable lymphadenopathy in the cervical, axillary or inguinal LUNGS: clear to auscultation and percussion with normal breathing effort HEART: regular rate & rhythm and no murmurs and no lower extremity edema ABDOMEN:abdomen soft, non-tender and normal bowel sounds.  Well-healed surgical scar Musculoskeletal:no cyanosis of digits and no clubbing  NEURO: alert & oriented x 3 with fluent speech, noted left lower leg weakness  LABORATORY DATA:  I have reviewed the data as  listed    Component Value Date/Time   NA 135 (L) 10/19/2017 1354   NA 135 (L) 07/24/2017 1357   K 4.1 10/19/2017 1354   K 3.9 07/24/2017 1357   CL 99 10/19/2017 1354   CO2 26 10/19/2017 1354   CO2 24 07/24/2017 1357   GLUCOSE 121 10/19/2017 1354   GLUCOSE 141 (H) 07/24/2017 1357   BUN 16 10/19/2017 1354   BUN 10.4 07/24/2017 1357   CREATININE 0.97 10/19/2017 1354   CREATININE 1.2 (H) 07/24/2017 1357   CALCIUM 10.4 10/19/2017 1354   CALCIUM 9.2 07/24/2017 1357   PROT 8.0 10/19/2017 1354   PROT 6.7 07/24/2017 1357   ALBUMIN 3.7 10/19/2017 1354   ALBUMIN 3.3 (L) 07/24/2017 1357   AST 14 10/19/2017 1354   AST 12 07/24/2017 1357   ALT 10 10/19/2017 1354   ALT 22 07/24/2017 1357   ALKPHOS 80 10/19/2017 1354   ALKPHOS 71 07/24/2017 1357   BILITOT 0.3 10/19/2017 1354   BILITOT 0.39 07/24/2017 1357   GFRNONAA >60 10/19/2017 1354   GFRAA >60 10/19/2017 1354    No results found for: SPEP, UPEP  Lab Results  Component Value Date   WBC 11.0 (H) 10/19/2017   NEUTROABS 9.5 (H)  10/19/2017   HGB 12.5 10/19/2017   HCT 37.8 10/19/2017   MCV 88.5 10/19/2017   PLT 322 10/19/2017      Chemistry      Component Value Date/Time   NA 135 (L) 10/19/2017 1354   NA 135 (L) 07/24/2017 1357   K 4.1 10/19/2017 1354   K 3.9 07/24/2017 1357   CL 99 10/19/2017 1354   CO2 26 10/19/2017 1354   CO2 24 07/24/2017 1357   BUN 16 10/19/2017 1354   BUN 10.4 07/24/2017 1357   CREATININE 0.97 10/19/2017 1354   CREATININE 1.2 (H) 07/24/2017 1357      Component Value Date/Time   CALCIUM 10.4 10/19/2017 1354   CALCIUM 9.2 07/24/2017 1357   ALKPHOS 80 10/19/2017 1354   ALKPHOS 71 07/24/2017 1357   AST 14 10/19/2017 1354   AST 12 07/24/2017 1357   ALT 10 10/19/2017 1354   ALT 22 07/24/2017 1357   BILITOT 0.3 10/19/2017 1354   BILITOT 0.39 07/24/2017 1357       All questions were answered. The patient knows to call the clinic with any problems, questions or concerns. No barriers to learning  was detected.  I spent 40 minutes counseling the patient face to face. The total time spent in the appointment was 55 minutes and more than 50% was on counseling and review of test results  [name], MD 12/04/2017 12:57 PM

## 2017-12-05 ENCOUNTER — Telehealth: Payer: Self-pay

## 2017-12-05 ENCOUNTER — Encounter: Payer: Self-pay | Admitting: Hematology and Oncology

## 2017-12-05 NOTE — Telephone Encounter (Signed)
Per Lorre Nick at [phone] [facility]: ok with MSI testing but need to know which NTRK mutation and C-Kit.  Dr Alvy Bimler circled Tropomyosin Receptor Kinase Immunostain and C-Kit is [id number] on the faxed sheet from their office.  I called Lucy back and she said this will be ordered today or tomorrow.

## 2017-12-06 ENCOUNTER — Telehealth: Payer: Self-pay

## 2017-12-06 ENCOUNTER — Other Ambulatory Visit: Payer: Self-pay | Admitting: Hematology and Oncology

## 2017-12-06 MED ORDER — HYDROCODONE-ACETAMINOPHEN 5-325 MG PO TABS: 1.0000 | ORAL_TABLET | Freq: Four times a day (QID) | ORAL | 0 refills | Status: DC | PRN

## 2017-12-06 NOTE — Telephone Encounter (Signed)
Outgoing call to patient and let her know her Vicodin refill prescription was available for pick up at [facility] for her to take to her pharmacy, pt voiced understanding. No other needs per pt at this time.

## 2017-12-06 NOTE — Telephone Encounter (Signed)
Received VM from pt regarding she has emailed our office for a refill of her pain medicine, Returned her call, she took the last Vicodin this am.  Reviewed her email and copy/paste below:  Hi Dr Alvy Bimler,    I guess I am not superwoman and do need a prescription refill for hydrocodone-acetaminophen. I increased my exercise and walking and the pain increased    Thanks    Will forward to Dr Alvy Bimler at this time.

## 2017-12-21 ENCOUNTER — Encounter: Payer: Self-pay | Admitting: Hematology and Oncology

## 2017-12-21 ENCOUNTER — Inpatient Hospital Stay: Payer: 59 | Attending: Hematology and Oncology | Admitting: Hematology and Oncology

## 2017-12-21 DIAGNOSIS — G893 Neoplasm related pain (acute) (chronic): Secondary | ICD-10-CM | POA: Insufficient documentation

## 2017-12-21 DIAGNOSIS — C414 Malignant neoplasm of pelvic bones, sacrum and coccyx: Secondary | ICD-10-CM | POA: Diagnosis present

## 2017-12-21 DIAGNOSIS — R531 Weakness: Secondary | ICD-10-CM | POA: Insufficient documentation

## 2017-12-21 DIAGNOSIS — K5909 Other constipation: Secondary | ICD-10-CM | POA: Diagnosis not present

## 2017-12-21 DIAGNOSIS — R64 Cachexia: Secondary | ICD-10-CM | POA: Diagnosis not present

## 2017-12-21 MED ORDER — MORPHINE SULFATE ER 15 MG PO TBCR: 15.0000 mg | EXTENDED_RELEASE_TABLET | Freq: Two times a day (BID) | ORAL | 0 refills | Status: AC

## 2017-12-21 MED ORDER — CYCLOBENZAPRINE HCL 10 MG PO TABS: 10.0000 mg | ORAL_TABLET | Freq: Three times a day (TID) | ORAL | 3 refills | Status: AC | PRN

## 2017-12-21 MED ORDER — HYDROCODONE-ACETAMINOPHEN 10-325 MG PO TABS: 1.0000 | ORAL_TABLET | Freq: Four times a day (QID) | ORAL | 0 refills | Status: DC | PRN

## 2017-12-21 NOTE — Progress Notes (Signed)
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[place]) I have reviewed the general approach to treatment of sarcoma The size of her tumor is quite big and margins were positive I recommend repeat staging CT scan of the chest, abdomen and pelvis and to consider adjuvant chemotherapy I also recommend consideration for her to return to [place] to see if there are clinical trials available for her I have reviewed additional results regarding NTRK, c-kit and MSI testing on the tissue sample: results showed no actionable target She is weak and frail and I doubt she can tolerate conventional standard chemotherapy She will call me once she have seen sarcoma specialist at [facility] and I will see her back to discuss further plan of care  Cancer associated pain She has poorly controlled pain I plan to increase the dose of hydrocodone and start her on long-acting MS Contin for better pain control I warn her about risk of sedation, nausea and constipation  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([place]) She had recent weight loss I recommend frequent small meals and dietary supplement as tolerated   No orders of the defined types were placed in this encounter.   INTERVAL HISTORY: Please see below for problem oriented charting. 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 9.6 x 6.3 x 11.9 cm heterogeneous mass within the left pelvis at the level of the left iliacus as above. Primary differential considerations consist of possible sarcomatous tumor, potentially arising from the left  iliacus muscle. In neurogenic tumor could also be considered. Further evaluation with histologic sampling recommended, as this lesion would likely be amenable to percutaneous sampling. 2. Degenerative disc bulging and facet arthropathy at L4-5 and L5-S1 with resultant mild canal with moderate left subarticular stenosis. No frank neural impingement within the lumbar spine      05/31/2017 - 06/12/2017 Hospital Admission    She had recurrent admission to the hospital due to severe pain.  She underwent CT-guided biopsy and pain management      06/01/2017 Procedure    Technically successful CT-guided core biopsy, left pelvic mass      06/01/2017 Pathology Results    Soft tissue mass, biopsy, Left Pelvic Mass - MALIGNANT SPINDLE CELL PROLIFERATION. - SEE COMMENT. Microscopic Comment The biopsy fragments reveal an atypical spindle cell proliferation. The lesion shows increased cellularity around large caliber blood vessels. There is moderate cytologic atypia as well as mitoses. The tumor cells are positive for CD34, desmin, S100, and focally positive for p63. They are negative for smooth muscle myosin, smooth muscle actin, CD99, CD31, CD117, calponin, EMA, cytokeratin 8/18 and cytokeratin AE1/AE3. The immunohistochemical profile is non-specific; however, the differential diagnosis includes a sarcoma such as a malignant peripheral nerve sheath tumor. Dr. Claudette Laws has reviewed the case and is in essential agreement with this interpretation. (JBK:ah 10/30-31/18)      07/06/2017 -  Radiation Therapy    She received preoperative radiation therapy at [facility]       10/17/2017 Imaging    CHEST: .Thoracic inlet/axilla: Within normal limits. .Central  airways: Within normal limits. .Mediastinum/hila: Within normal limits.  .Heart/vessel: Normal heart size. No pericardial effusion. Aorta normal in caliber and appearance. No pulmonary embolism.  .Lungs: A linear subpleural nodule at the right  lower lobe on series 4 image 132 is unchanged. .Pleura: Within normal limits.   ABDOMEN: .Liver: Within normal limits. .Gallbladder/biliary: Within normal limits. .Spleen: Within normal limits. .Pancreas: Within normal limits. .Adrenals: Within normal limits. .Kidneys: Severe left hydronephrosis with delayed nephrogram. .Peritoneum: Within normal limits. .Mesentery: Within normal limits. .Extraperitoneum: The left psoas muscle is involved by the large extraperitoneal mass which is described below. .GI tract: Nonobstructed. .Vascular: Within normal limits.  PELVIS: .Peritoneum: Within normal limits. .Extraperitoneum: Left pelvic mass measuring up to 13.7 x 6.5 cm transaxial, extending from the level of the left lower kidney, involving the iliopsoas musculature, occupying the left iliac fossa. The mass extends into the left inguinal region as well, involving the proximal rectus femoris muscle.  The left external iliac artery is displaced anteriorly by the mass, while the left internal iliac artery courses posterior to the mass.Marland Kitchen Marland KitchenUreters: Within normal limits. .Bladder: Within normal limits. .Reproductive system: Within normal limits. .Vascular: Vessels surrounding the left pelvic mass described above. Additionally, these left common iliac vein is compressed by the tumor. The left femoral vein is patent, but no assessment is made of the veins within the pelvis.  MSK: .The left pelvic lesion is closely applied to the iliac wing and there is a focus of likely erosion on series 2 image 172. Otherwise the iliac wing appears intact.      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 The tumor mitotic rate is 10 mitosis/ 10 HPFs. Tumor necrosis is present, representing approximately 60-70% of the tumor.  Angiolymphatic invasion is not identified. MPNST involving the distal, lateral, medial and deep surgical margins      11/13/2017 Surgery    She has gross resection of her disease       REVIEW OF SYSTEMS:   Constitutional: Denies fevers, chills  Eyes: Denies blurriness of vision Ears, nose, mouth, throat, and face: Denies mucositis or sore throat Respiratory: Denies cough, dyspnea or wheezes Cardiovascular: Denies palpitation, chest discomfort or lower extremity swelling Gastrointestinal:  Denies nausea, heartburn or change in bowel habits Skin: Denies abnormal skin rashes Lymphatics: Denies new lymphadenopathy or easy bruising Neurological:Denies numbness, tingling or new weaknesses Behavioral/Psych: Mood is stable, no new changes  All other systems were reviewed with the patient and are negative.  I have reviewed the past medical history, past surgical history, social history and family history with the patient and they are unchanged from previous note.  ALLERGIES:  has No Known Allergies.  MEDICATIONS:  Current Outpatient Medications  Medication Sig Dispense Refill  . cyclobenzaprine (FLEXERIL) 10 MG tablet Take 1 tablet (10 mg total) by mouth 3 (three) times daily as needed for muscle spasms. 60 tablet 3  . feeding supplement, ENSURE ENLIVE, (ENSURE ENLIVE) LIQD Take 237 mLs by mouth 2 (two) times daily between meals. 237 mL 12  . gabapentin (NEURONTIN) 300 MG capsule Take 1 capsule (300 mg total) by mouth 3 (three) times daily. 90 capsule 1  . HYDROcodone-acetaminophen (NORCO) 10-325 MG tablet Take 1 tablet by mouth every 6 (six) hours as needed. 90 tablet 0  . HYDROcodone-acetaminophen (NORCO/VICODIN) 5-325 MG tablet Take 1 tablet by mouth every 6 (six) hours  as needed. 90 tablet 0  . lisinopril (PRINIVIL,ZESTRIL) 20 MG tablet   0  . morphine (MS CONTIN) 15 MG 12 hr tablet Take 1 tablet (15 mg total) by mouth every 12 (twelve) hours. 60 tablet 0  . ondansetron (ZOFRAN) 8 MG  tablet Take 1 tablet (8 mg total) by mouth every 8 (eight) hours as needed for nausea. 30 tablet 3  . prochlorperazine (COMPAZINE) 10 MG tablet Take 1 tablet (10 mg total) by mouth every 6 (six) hours as needed for nausea or vomiting. 30 tablet 1  . senna (SENOKOT) 8.6 MG TABS tablet Take 1 tablet (8.6 mg total) 2 (two) times daily by mouth. 120 each 0  . simvastatin (ZOCOR) 10 MG tablet Take 1 tablet (10 mg total) by mouth at bedtime. 30 tablet 1   No current facility-administered medications for this visit.     PHYSICAL EXAMINATION: ECOG PERFORMANCE STATUS: 2 - Symptomatic, <50% confined to bed  Vitals:   12/21/17 1221  BP: 125/67  Pulse: (!) 103  Resp: 18  Temp: 98.7 F (37.1 C)  SpO2: 100%   Filed Weights   12/21/17 1221  Weight: 103 lb 11.2 oz (47 kg)    GENERAL:alert, no distress and comfortable.  She looks thin and cachectic SKIN: skin color, texture, turgor are normal, no rashes or significant lesions Musculoskeletal:no cyanosis of digits and no clubbing  NEURO: alert & oriented x 3 with fluent speech, no focal motor/sensory deficits  LABORATORY DATA:  I have reviewed the data as listed    Component Value Date/Time   NA 135 (L) 10/19/2017 1354   NA 135 (L) 07/24/2017 1357   K 4.1 10/19/2017 1354   K 3.9 07/24/2017 1357   CL 99 10/19/2017 1354   CO2 26 10/19/2017 1354   CO2 24 07/24/2017 1357   GLUCOSE 121 10/19/2017 1354   GLUCOSE 141 (H) 07/24/2017 1357   BUN 16 10/19/2017 1354   BUN 10.4 07/24/2017 1357   CREATININE 0.97 10/19/2017 1354   CREATININE 1.2 (H) 07/24/2017 1357   CALCIUM 10.4 10/19/2017 1354   CALCIUM 9.2 07/24/2017 1357   PROT 8.0 10/19/2017 1354   PROT 6.7 07/24/2017 1357   ALBUMIN 3.7 10/19/2017 1354   ALBUMIN 3.3 (L) 07/24/2017 1357   AST 14 10/19/2017 1354   AST 12 07/24/2017 1357   ALT 10 10/19/2017 1354   ALT 22 07/24/2017 1357   ALKPHOS 80 10/19/2017 1354   ALKPHOS 71 07/24/2017 1357   BILITOT 0.3 10/19/2017 1354   BILITOT 0.39  07/24/2017 1357   GFRNONAA >60 10/19/2017 1354   GFRAA >60 10/19/2017 1354    No results found for: SPEP, UPEP  Lab Results  Component Value Date   WBC 11.0 (H) 10/19/2017   NEUTROABS 9.5 (H) 10/19/2017   HGB 12.5 10/19/2017   HCT 37.8 10/19/2017   MCV 88.5 10/19/2017   PLT 322 10/19/2017      Chemistry      Component Value Date/Time   NA 135 (L) 10/19/2017 1354   NA 135 (L) 07/24/2017 1357   K 4.1 10/19/2017 1354   K 3.9 07/24/2017 1357   CL 99 10/19/2017 1354   CO2 26 10/19/2017 1354   CO2 24 07/24/2017 1357   BUN 16 10/19/2017 1354   BUN 10.4 07/24/2017 1357   CREATININE 0.97 10/19/2017 1354   CREATININE 1.2 (H) 07/24/2017 1357      Component Value Date/Time   CALCIUM 10.4 10/19/2017 1354   CALCIUM 9.2 07/24/2017 1357  ALKPHOS 80 10/19/2017 1354   ALKPHOS 71 07/24/2017 1357   AST 14 10/19/2017 1354   AST 12 07/24/2017 1357   ALT 10 10/19/2017 1354   ALT 22 07/24/2017 1357   BILITOT 0.3 10/19/2017 1354   BILITOT 0.39 07/24/2017 1357     All questions were answered. The patient knows to call the clinic with any problems, questions or concerns. No barriers to learning was detected.  I spent 15 minutes counseling the patient face to face. The total time spent in the appointment was 20 minutes and more than 50% was on counseling and review of test results  [name], MD 12/21/2017 2:33 PM

## 2017-12-21 NOTE — Assessment & Plan Note (Signed)
I have reviewed the general approach to treatment of sarcoma The size of her tumor is quite big and margins were positive I recommend repeat staging CT scan of the chest, abdomen and pelvis and to consider adjuvant chemotherapy I also recommend consideration for her to return to [facility] to see if there are clinical trials available for her I have reviewed additional results regarding NTRK, c-kit and MSI testing on the tissue sample: results showed no actionable target She is weak and frail and I doubt she can tolerate conventional standard chemotherapy She will call me once she have seen sarcoma specialist at [facility] and I will see her back to discuss further plan of care

## 2017-12-21 NOTE — Assessment & Plan Note (Signed)
She had recent weight loss I recommend frequent small meals and dietary supplement as tolerated

## 2017-12-21 NOTE — Assessment & Plan Note (Signed)
She has poorly controlled pain I plan to increase the dose of hydrocodone and start her on long-acting MS Contin for better pain control I warn her about risk of sedation, nausea and constipation

## 2017-12-26 ENCOUNTER — Telehealth: Payer: Self-pay | Admitting: Hematology and Oncology

## 2017-12-26 NOTE — Telephone Encounter (Signed)
Left message for patient regarding upcoming may appointments per 5/22 sch message

## 2018-01-04 ENCOUNTER — Inpatient Hospital Stay (HOSPITAL_BASED_OUTPATIENT_CLINIC_OR_DEPARTMENT_OTHER): Payer: 59 | Admitting: Hematology and Oncology

## 2018-01-04 ENCOUNTER — Encounter: Payer: Self-pay | Admitting: Hematology and Oncology

## 2018-01-04 DIAGNOSIS — R64 Cachexia: Secondary | ICD-10-CM | POA: Diagnosis not present

## 2018-01-04 DIAGNOSIS — C414 Malignant neoplasm of pelvic bones, sacrum and coccyx: Secondary | ICD-10-CM | POA: Diagnosis not present

## 2018-01-04 DIAGNOSIS — R531 Weakness: Secondary | ICD-10-CM | POA: Diagnosis not present

## 2018-01-04 DIAGNOSIS — G893 Neoplasm related pain (acute) (chronic): Secondary | ICD-10-CM

## 2018-01-04 DIAGNOSIS — K5903 Drug induced constipation: Secondary | ICD-10-CM

## 2018-01-04 DIAGNOSIS — K5909 Other constipation: Secondary | ICD-10-CM

## 2018-01-04 DIAGNOSIS — R29898 Other symptoms and signs involving the musculoskeletal system: Secondary | ICD-10-CM

## 2018-01-04 MED ORDER — DOCUSATE SODIUM 100 MG PO CAPS: 200.0000 mg | ORAL_CAPSULE | Freq: Two times a day (BID) | ORAL | 3 refills | Status: AC

## 2018-01-04 MED ORDER — SENNA 8.6 MG PO TABS: 2.0000 | ORAL_TABLET | Freq: Two times a day (BID) | ORAL | 9 refills | Status: AC

## 2018-01-04 NOTE — Progress Notes (Signed)
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[place]) I am not able to review recommendation from [facility] According to the patient and her husband, they are in favor of waiting for repeat surveillance imaging from [facility] in September before deciding if she needs chemotherapy I will continue to provide supportive care I am delighted to see that she has gained some weight since the last time I saw her I plan to see her back after repeat imaging studies are available  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[facility]) She is doing well and is eating better I recommend increase oral intake as tolerated  Left leg weakness She is doing well and is getting physical therapy 3 times a week I encouraged her to continue physical therapy as tolerated  Drug induced constipation She continues to have significant severe constipation likely due to narcotic prescription I recommend addition of stool softener twice a day along with increased dose of Senokot twice a day  Cancer associated pain We discussed pain management Her pain is well controlled with current prescription pain medicine She does not need medication refill   No orders of the defined types were placed in this encounter.   INTERVAL HISTORY: Please see below for problem oriented charting. 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 9.6 x 6.3 x 11.9 cm heterogeneous mass within  the left pelvis at the level of the left iliacus as above. Primary differential considerations consist of possible sarcomatous tumor, potentially arising from the left iliacus muscle. In neurogenic tumor could also be considered. Further evaluation with histologic sampling recommended, as this lesion would likely be amenable to percutaneous sampling. 2. Degenerative disc bulging and facet arthropathy at L4-5 and L5-S1 with resultant mild canal with moderate left subarticular stenosis. No frank neural impingement within the lumbar spine      05/31/2017 - 06/12/2017 Hospital Admission    She had recurrent admission to the hospital due to severe pain.  She underwent CT-guided biopsy and pain management      06/01/2017 Procedure    Technically successful CT-guided core biopsy, left pelvic mass      06/01/2017 Pathology Results    Soft tissue mass, biopsy, Left Pelvic Mass - MALIGNANT SPINDLE CELL PROLIFERATION. - SEE COMMENT. Microscopic Comment The biopsy fragments reveal an atypical spindle cell proliferation. The lesion shows increased cellularity around large caliber blood vessels. There is moderate cytologic atypia as well as mitoses. The tumor cells are positive for CD34, desmin, S100, and focally positive for p63. They are negative for smooth muscle myosin, smooth muscle actin, CD99, CD31, CD117, calponin, EMA, cytokeratin 8/18 and cytokeratin AE1/AE3. The immunohistochemical profile is non-specific; however, the differential diagnosis includes a sarcoma such as a malignant peripheral nerve sheath tumor. Dr. Claudette Laws has reviewed the case and is in essential agreement with this interpretation. (JBK:ah 10/30-31/18)      07/06/2017 -  Radiation Therapy    She  received preoperative radiation therapy at [facility]       10/17/2017 Imaging    CHEST: .Thoracic inlet/axilla: Within normal limits. .Central airways: Within normal limits. .Mediastinum/hila: Within normal limits.   .Heart/vessel: Normal heart size. No pericardial effusion. Aorta normal in caliber and appearance. No pulmonary embolism.  .Lungs: A linear subpleural nodule at the right lower lobe on series 4 image 132 is unchanged. .Pleura: Within normal limits.   ABDOMEN: .Liver: Within normal limits. .Gallbladder/biliary: Within normal limits. .Spleen: Within normal limits. .Pancreas: Within normal limits. .Adrenals: Within normal limits. .Kidneys: Severe left hydronephrosis with delayed nephrogram. .Peritoneum: Within normal limits. .Mesentery: Within normal limits. .Extraperitoneum: The left psoas muscle is involved by the large extraperitoneal mass which is described below. .GI tract: Nonobstructed. .Vascular: Within normal limits.  PELVIS: .Peritoneum: Within normal limits. .Extraperitoneum: Left pelvic mass measuring up to 13.7 x 6.5 cm transaxial, extending from the level of the left lower kidney, involving the iliopsoas musculature, occupying the left iliac fossa. The mass extends into the left inguinal region as well, involving the proximal rectus femoris muscle.  The left external iliac artery is displaced anteriorly by the mass, while the left internal iliac artery courses posterior to the mass.Marland Kitchen Marland KitchenUreters: Within normal limits. .Bladder: Within normal limits. .Reproductive system: Within normal limits. .Vascular: Vessels surrounding the left pelvic mass described above. Additionally, these left common iliac vein is compressed by the tumor. The left femoral vein is patent, but no assessment is made of the veins within the pelvis.  MSK: .The left pelvic lesion is closely applied to the iliac wing and there is a focus of likely erosion on series 2 image 172. Otherwise the iliac wing appears intact.      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 The tumor mitotic rate is 10 mitosis/ 10 HPFs. Tumor necrosis is present, representing approximately 60-70% of the tumor. Angiolymphatic invasion is not identified. MPNST involving the distal, lateral, medial and deep surgical margins      11/13/2017 Surgery    She has gross resection of her disease       REVIEW OF SYSTEMS:   Constitutional: Denies fevers, chills or abnormal weight loss Eyes: Denies blurriness of vision Ears, nose, mouth, throat, and face: Denies mucositis or sore throat Respiratory: Denies cough, dyspnea or wheezes Cardiovascular: Denies palpitation, chest discomfort or lower extremity swelling Skin: Denies abnormal skin rashes Lymphatics: Denies new lymphadenopathy or easy bruising Neurological:Denies numbness, tingling or new weaknesses Behavioral/Psych: Mood is stable, no new changes  All other systems were reviewed with the patient and are negative.  I have reviewed the past medical history, past surgical history, social history and family history with the patient and they are unchanged from previous note.  ALLERGIES:  has No Known Allergies.  MEDICATIONS:  Current Outpatient Medications  Medication Sig Dispense Refill  . cyclobenzaprine (FLEXERIL) 10 MG tablet Take 1 tablet (10 mg total) by mouth 3 (three) times daily as needed for muscle spasms. 60 tablet 3  . docusate sodium (COLACE) 100 MG capsule Take 2 capsules (200 mg total) by mouth 2 (two) times daily. 60 capsule 3  . feeding supplement, ENSURE ENLIVE, (ENSURE ENLIVE) LIQD Take 237 mLs by mouth 2 (two) times daily between meals. 237 mL 12  . gabapentin (NEURONTIN) 300 MG capsule Take 1 capsule (300 mg total) by mouth 3 (three) times daily.  90 capsule 1  . HYDROcodone-acetaminophen (NORCO) 10-325 MG tablet Take 1 tablet by mouth every 6 (six) hours as needed. 90 tablet 0  . lisinopril (PRINIVIL,ZESTRIL) 20 MG tablet   0  . morphine (MS CONTIN) 15 MG 12  hr tablet Take 1 tablet (15 mg total) by mouth every 12 (twelve) hours. 60 tablet 0  . ondansetron (ZOFRAN) 8 MG tablet Take 1 tablet (8 mg total) by mouth every 8 (eight) hours as needed for nausea. 30 tablet 3  . prochlorperazine (COMPAZINE) 10 MG tablet Take 1 tablet (10 mg total) by mouth every 6 (six) hours as needed for nausea or vomiting. 30 tablet 1  . senna (SENOKOT) 8.6 MG TABS tablet Take 2 tablets (17.2 mg total) by mouth 2 (two) times daily. 120 each 9  . simvastatin (ZOCOR) 10 MG tablet Take 1 tablet (10 mg total) by mouth at bedtime. 30 tablet 1   No current facility-administered medications for this visit.     PHYSICAL EXAMINATION: ECOG PERFORMANCE STATUS: 1 - Symptomatic but completely ambulatory  Vitals:   01/04/18 1217  BP: 108/73  Pulse: (!) 122  Resp: 18  Temp: 99.2 F (37.3 C)  SpO2: 98%   Filed Weights   01/04/18 1217  Weight: 107 lb (48.5 kg)    GENERAL:alert, no distress and comfortable Musculoskeletal:no cyanosis of digits and no clubbing  NEURO: alert & oriented x 3 with fluent speech, no focal motor/sensory deficits  LABORATORY DATA:  I have reviewed the data as listed    Component Value Date/Time   NA 135 (L) 10/19/2017 1354   NA 135 (L) 07/24/2017 1357   K 4.1 10/19/2017 1354   K 3.9 07/24/2017 1357   CL 99 10/19/2017 1354   CO2 26 10/19/2017 1354   CO2 24 07/24/2017 1357   GLUCOSE 121 10/19/2017 1354   GLUCOSE 141 (H) 07/24/2017 1357   BUN 16 10/19/2017 1354   BUN 10.4 07/24/2017 1357   CREATININE 0.97 10/19/2017 1354   CREATININE 1.2 (H) 07/24/2017 1357   CALCIUM 10.4 10/19/2017 1354   CALCIUM 9.2 07/24/2017 1357   PROT 8.0 10/19/2017 1354   PROT 6.7 07/24/2017 1357   ALBUMIN 3.7 10/19/2017 1354   ALBUMIN 3.3 (L) 07/24/2017 1357   AST 14 10/19/2017 1354   AST 12 07/24/2017 1357   ALT 10 10/19/2017 1354   ALT 22 07/24/2017 1357   ALKPHOS 80 10/19/2017 1354   ALKPHOS 71 07/24/2017 1357   BILITOT 0.3 10/19/2017 1354   BILITOT  0.39 07/24/2017 1357   GFRNONAA >60 10/19/2017 1354   GFRAA >60 10/19/2017 1354    No results found for: SPEP, UPEP  Lab Results  Component Value Date   WBC 11.0 (H) 10/19/2017   NEUTROABS 9.5 (H) 10/19/2017   HGB 12.5 10/19/2017   HCT 37.8 10/19/2017   MCV 88.5 10/19/2017   PLT 322 10/19/2017      Chemistry      Component Value Date/Time   NA 135 (L) 10/19/2017 1354   NA 135 (L) 07/24/2017 1357   K 4.1 10/19/2017 1354   K 3.9 07/24/2017 1357   CL 99 10/19/2017 1354   CO2 26 10/19/2017 1354   CO2 24 07/24/2017 1357   BUN 16 10/19/2017 1354   BUN 10.4 07/24/2017 1357   CREATININE 0.97 10/19/2017 1354   CREATININE 1.2 (H) 07/24/2017 1357      Component Value Date/Time   CALCIUM 10.4 10/19/2017 1354   CALCIUM 9.2 07/24/2017 1357   ALKPHOS  80 10/19/2017 1354   ALKPHOS 71 07/24/2017 1357   AST 14 10/19/2017 1354   AST 12 07/24/2017 1357   ALT 10 10/19/2017 1354   ALT 22 07/24/2017 1357   BILITOT 0.3 10/19/2017 1354   BILITOT 0.39 07/24/2017 1357       All questions were answered. The patient knows to call the clinic with any problems, questions or concerns. No barriers to learning was detected.  I spent 15 minutes counseling the patient face to face. The total time spent in the appointment was 20 minutes and more than 50% was on counseling and review of test results  [name], MD 01/04/2018 12:51 PM

## 2018-01-04 NOTE — Assessment & Plan Note (Signed)
I am not able to review recommendation from [facility] According to the patient and her husband, they are in favor of waiting for repeat surveillance imaging from [facility] in September before deciding if she needs chemotherapy I will continue to provide supportive care I am delighted to see that she has gained some weight since the last time I saw her I plan to see her back after repeat imaging studies are available

## 2018-01-04 NOTE — Assessment & Plan Note (Signed)
She is doing well and is eating better I recommend increase oral intake as tolerated

## 2018-01-04 NOTE — Assessment & Plan Note (Signed)
She continues to have significant severe constipation likely due to narcotic prescription I recommend addition of stool softener twice a day along with increased dose of Senokot twice a day

## 2018-01-04 NOTE — Assessment & Plan Note (Signed)
She is doing well and is getting physical therapy 3 times a week I encouraged her to continue physical therapy as tolerated

## 2018-01-04 NOTE — Assessment & Plan Note (Signed)
We discussed pain management Her pain is well controlled with current prescription pain medicine She does not need medication refill

## 2018-01-28 ENCOUNTER — Other Ambulatory Visit: Payer: Self-pay | Admitting: Hematology and Oncology

## 2018-01-28 MED ORDER — HYDROCODONE-ACETAMINOPHEN 10-325 MG PO TABS: 1.0000 | ORAL_TABLET | Freq: Four times a day (QID) | ORAL | 0 refills | Status: AC | PRN

## 2018-02-20 ENCOUNTER — Other Ambulatory Visit: Payer: Self-pay

## 2018-02-20 ENCOUNTER — Encounter (HOSPITAL_COMMUNITY): Payer: Self-pay

## 2018-02-20 ENCOUNTER — Inpatient Hospital Stay (HOSPITAL_COMMUNITY)
Admission: EM | Admit: 2018-02-20 | Discharge: 2018-04-07 | DRG: 829 | Disposition: E | Payer: 59 | Attending: Family Medicine | Admitting: Family Medicine

## 2018-02-20 ENCOUNTER — Emergency Department (HOSPITAL_COMMUNITY): Payer: 59

## 2018-02-20 DIAGNOSIS — E785 Hyperlipidemia, unspecified: Secondary | ICD-10-CM | POA: Diagnosis present

## 2018-02-20 DIAGNOSIS — K56600 Partial intestinal obstruction, unspecified as to cause: Secondary | ICD-10-CM | POA: Diagnosis present

## 2018-02-20 DIAGNOSIS — K5669 Other partial intestinal obstruction: Secondary | ICD-10-CM | POA: Diagnosis present

## 2018-02-20 DIAGNOSIS — E46 Unspecified protein-calorie malnutrition: Secondary | ICD-10-CM | POA: Diagnosis not present

## 2018-02-20 DIAGNOSIS — R609 Edema, unspecified: Secondary | ICD-10-CM | POA: Diagnosis not present

## 2018-02-20 DIAGNOSIS — Z85831 Personal history of malignant neoplasm of soft tissue: Secondary | ICD-10-CM

## 2018-02-20 DIAGNOSIS — Z681 Body mass index (BMI) 19 or less, adult: Secondary | ICD-10-CM | POA: Diagnosis not present

## 2018-02-20 DIAGNOSIS — D500 Iron deficiency anemia secondary to blood loss (chronic): Secondary | ICD-10-CM

## 2018-02-20 DIAGNOSIS — R8271 Bacteriuria: Secondary | ICD-10-CM | POA: Diagnosis present

## 2018-02-20 DIAGNOSIS — Z90710 Acquired absence of both cervix and uterus: Secondary | ICD-10-CM

## 2018-02-20 DIAGNOSIS — I878 Other specified disorders of veins: Secondary | ICD-10-CM

## 2018-02-20 DIAGNOSIS — D473 Essential (hemorrhagic) thrombocythemia: Secondary | ICD-10-CM | POA: Diagnosis not present

## 2018-02-20 DIAGNOSIS — G893 Neoplasm related pain (acute) (chronic): Secondary | ICD-10-CM | POA: Diagnosis present

## 2018-02-20 DIAGNOSIS — Z87891 Personal history of nicotine dependence: Secondary | ICD-10-CM

## 2018-02-20 DIAGNOSIS — R110 Nausea: Secondary | ICD-10-CM | POA: Diagnosis not present

## 2018-02-20 DIAGNOSIS — R64 Cachexia: Secondary | ICD-10-CM | POA: Diagnosis present

## 2018-02-20 DIAGNOSIS — N179 Acute kidney failure, unspecified: Secondary | ICD-10-CM | POA: Diagnosis present

## 2018-02-20 DIAGNOSIS — R112 Nausea with vomiting, unspecified: Secondary | ICD-10-CM | POA: Diagnosis not present

## 2018-02-20 DIAGNOSIS — Z0189 Encounter for other specified special examinations: Secondary | ICD-10-CM

## 2018-02-20 DIAGNOSIS — R410 Disorientation, unspecified: Secondary | ICD-10-CM | POA: Diagnosis not present

## 2018-02-20 DIAGNOSIS — Z923 Personal history of irradiation: Secondary | ICD-10-CM

## 2018-02-20 DIAGNOSIS — Z86010 Personal history of colonic polyps: Secondary | ICD-10-CM

## 2018-02-20 DIAGNOSIS — C414 Malignant neoplasm of pelvic bones, sacrum and coccyx: Secondary | ICD-10-CM | POA: Diagnosis not present

## 2018-02-20 DIAGNOSIS — Z960 Presence of urogenital implants: Secondary | ICD-10-CM | POA: Diagnosis present

## 2018-02-20 DIAGNOSIS — N1330 Unspecified hydronephrosis: Secondary | ICD-10-CM | POA: Diagnosis not present

## 2018-02-20 DIAGNOSIS — K567 Ileus, unspecified: Secondary | ICD-10-CM

## 2018-02-20 DIAGNOSIS — I10 Essential (primary) hypertension: Secondary | ICD-10-CM | POA: Diagnosis present

## 2018-02-20 DIAGNOSIS — E872 Acidosis: Secondary | ICD-10-CM | POA: Diagnosis present

## 2018-02-20 DIAGNOSIS — F1290 Cannabis use, unspecified, uncomplicated: Secondary | ICD-10-CM | POA: Diagnosis present

## 2018-02-20 DIAGNOSIS — E870 Hyperosmolality and hypernatremia: Secondary | ICD-10-CM | POA: Diagnosis present

## 2018-02-20 DIAGNOSIS — E871 Hypo-osmolality and hyponatremia: Secondary | ICD-10-CM | POA: Diagnosis present

## 2018-02-20 DIAGNOSIS — Z8541 Personal history of malignant neoplasm of cervix uteri: Secondary | ICD-10-CM

## 2018-02-20 DIAGNOSIS — D649 Anemia, unspecified: Secondary | ICD-10-CM | POA: Diagnosis not present

## 2018-02-20 DIAGNOSIS — K56601 Complete intestinal obstruction, unspecified as to cause: Secondary | ICD-10-CM | POA: Diagnosis not present

## 2018-02-20 DIAGNOSIS — Z79899 Other long term (current) drug therapy: Secondary | ICD-10-CM

## 2018-02-20 DIAGNOSIS — C495 Malignant neoplasm of connective and soft tissue of pelvis: Secondary | ICD-10-CM

## 2018-02-20 DIAGNOSIS — B961 Klebsiella pneumoniae [K. pneumoniae] as the cause of diseases classified elsewhere: Secondary | ICD-10-CM | POA: Diagnosis present

## 2018-02-20 DIAGNOSIS — Z8589 Personal history of malignant neoplasm of other organs and systems: Secondary | ICD-10-CM

## 2018-02-20 DIAGNOSIS — K5903 Drug induced constipation: Secondary | ICD-10-CM

## 2018-02-20 DIAGNOSIS — Z66 Do not resuscitate: Secondary | ICD-10-CM | POA: Diagnosis present

## 2018-02-20 DIAGNOSIS — Z8249 Family history of ischemic heart disease and other diseases of the circulatory system: Secondary | ICD-10-CM

## 2018-02-20 DIAGNOSIS — R001 Bradycardia, unspecified: Secondary | ICD-10-CM | POA: Diagnosis not present

## 2018-02-20 DIAGNOSIS — E440 Moderate protein-calorie malnutrition: Secondary | ICD-10-CM | POA: Diagnosis present

## 2018-02-20 DIAGNOSIS — Z515 Encounter for palliative care: Secondary | ICD-10-CM

## 2018-02-20 DIAGNOSIS — C480 Malignant neoplasm of retroperitoneum: Principal | ICD-10-CM | POA: Diagnosis present

## 2018-02-20 DIAGNOSIS — K56699 Other intestinal obstruction unspecified as to partial versus complete obstruction: Secondary | ICD-10-CM | POA: Diagnosis not present

## 2018-02-20 DIAGNOSIS — G5792 Unspecified mononeuropathy of left lower limb: Secondary | ICD-10-CM | POA: Diagnosis present

## 2018-02-20 DIAGNOSIS — K56609 Unspecified intestinal obstruction, unspecified as to partial versus complete obstruction: Secondary | ICD-10-CM | POA: Diagnosis not present

## 2018-02-20 DIAGNOSIS — D630 Anemia in neoplastic disease: Secondary | ICD-10-CM | POA: Diagnosis present

## 2018-02-20 DIAGNOSIS — N390 Urinary tract infection, site not specified: Secondary | ICD-10-CM | POA: Diagnosis not present

## 2018-02-20 DIAGNOSIS — Z931 Gastrostomy status: Secondary | ICD-10-CM

## 2018-02-20 DIAGNOSIS — M7989 Other specified soft tissue disorders: Secondary | ICD-10-CM | POA: Diagnosis not present

## 2018-02-20 DIAGNOSIS — N3000 Acute cystitis without hematuria: Secondary | ICD-10-CM | POA: Diagnosis not present

## 2018-02-20 DIAGNOSIS — D72829 Elevated white blood cell count, unspecified: Secondary | ICD-10-CM | POA: Diagnosis present

## 2018-02-20 DIAGNOSIS — N136 Pyonephrosis: Secondary | ICD-10-CM | POA: Diagnosis present

## 2018-02-20 DIAGNOSIS — C499 Malignant neoplasm of connective and soft tissue, unspecified: Secondary | ICD-10-CM | POA: Diagnosis not present

## 2018-02-20 DIAGNOSIS — R000 Tachycardia, unspecified: Secondary | ICD-10-CM | POA: Diagnosis present

## 2018-02-20 DIAGNOSIS — F419 Anxiety disorder, unspecified: Secondary | ICD-10-CM | POA: Diagnosis present

## 2018-02-20 DIAGNOSIS — I5030 Unspecified diastolic (congestive) heart failure: Secondary | ICD-10-CM | POA: Diagnosis not present

## 2018-02-20 DIAGNOSIS — E860 Dehydration: Secondary | ICD-10-CM | POA: Diagnosis present

## 2018-02-20 DIAGNOSIS — D61818 Other pancytopenia: Secondary | ICD-10-CM | POA: Diagnosis not present

## 2018-02-20 DIAGNOSIS — D6869 Other thrombophilia: Secondary | ICD-10-CM | POA: Diagnosis present

## 2018-02-20 DIAGNOSIS — Z8041 Family history of malignant neoplasm of ovary: Secondary | ICD-10-CM

## 2018-02-20 DIAGNOSIS — Z7189 Other specified counseling: Secondary | ICD-10-CM | POA: Diagnosis not present

## 2018-02-20 DIAGNOSIS — C479 Malignant neoplasm of peripheral nerves and autonomic nervous system, unspecified: Secondary | ICD-10-CM | POA: Diagnosis present

## 2018-02-20 DIAGNOSIS — J309 Allergic rhinitis, unspecified: Secondary | ICD-10-CM | POA: Diagnosis present

## 2018-02-20 DIAGNOSIS — R600 Localized edema: Secondary | ICD-10-CM | POA: Diagnosis present

## 2018-02-20 DIAGNOSIS — R109 Unspecified abdominal pain: Secondary | ICD-10-CM

## 2018-02-20 DIAGNOSIS — R52 Pain, unspecified: Secondary | ICD-10-CM

## 2018-02-20 DIAGNOSIS — Z5111 Encounter for antineoplastic chemotherapy: Secondary | ICD-10-CM | POA: Diagnosis not present

## 2018-02-20 HISTORY — DX: Allergic rhinitis, unspecified: J30.9

## 2018-02-20 HISTORY — DX: Intra-abdominal and pelvic swelling, mass and lump, unspecified site: R19.00

## 2018-02-20 LAB — CBC
HCT: 33.8 % — ABNORMAL LOW (ref 36.0–46.0)
HEMOGLOBIN: 10.9 g/dL — AB (ref 12.0–15.0)
MCH: 25 pg — ABNORMAL LOW (ref 26.0–34.0)
MCHC: 32.2 g/dL (ref 30.0–36.0)
MCV: 77.5 fL — ABNORMAL LOW (ref 78.0–100.0)
PLATELETS: 667 10*3/uL — AB (ref 150–400)
RBC: 4.36 MIL/uL (ref 3.87–5.11)
RDW: 18.9 % — ABNORMAL HIGH (ref 11.5–15.5)
WBC: 12.8 10*3/uL — ABNORMAL HIGH (ref 4.0–10.5)

## 2018-02-20 LAB — URINALYSIS, ROUTINE W REFLEX MICROSCOPIC
Bilirubin Urine: NEGATIVE
Glucose, UA: NEGATIVE mg/dL
KETONES UR: NEGATIVE mg/dL
NITRITE: NEGATIVE
PROTEIN: NEGATIVE mg/dL
Specific Gravity, Urine: 1.005 (ref 1.005–1.030)
pH: 5 (ref 5.0–8.0)

## 2018-02-20 LAB — COMPREHENSIVE METABOLIC PANEL
ALK PHOS: 70 U/L (ref 38–126)
ALT: 10 U/L (ref 0–44)
ANION GAP: 13 (ref 5–15)
AST: 16 U/L (ref 15–41)
Albumin: 2.8 g/dL — ABNORMAL LOW (ref 3.5–5.0)
BILIRUBIN TOTAL: 0.4 mg/dL (ref 0.3–1.2)
BUN: 20 mg/dL (ref 6–20)
CO2: 24 mmol/L (ref 22–32)
CREATININE: 1.23 mg/dL — AB (ref 0.44–1.00)
Calcium: 8.9 mg/dL (ref 8.9–10.3)
Chloride: 92 mmol/L — ABNORMAL LOW (ref 98–111)
GFR calc non Af Amer: 47 mL/min — ABNORMAL LOW (ref >60)
GFR, EST AFRICAN AMERICAN: 55 mL/min — AB (ref >60)
GLUCOSE: 98 mg/dL (ref 70–99)
Potassium: 3.8 mmol/L (ref 3.5–5.1)
Sodium: 129 mmol/L — ABNORMAL LOW (ref 135–145)
TOTAL PROTEIN: 6.7 g/dL (ref 6.5–8.1)

## 2018-02-20 LAB — I-STAT CG4 LACTIC ACID, ED
LACTIC ACID, VENOUS: 1.91 mmol/L — AB (ref 0.5–1.9)
Lactic Acid, Venous: 2.2 mmol/L (ref 0.5–1.9)

## 2018-02-20 LAB — LIPASE, BLOOD: Lipase: 22 U/L (ref 11–51)

## 2018-02-20 MED ORDER — DEXTROSE-NACL 5-0.9 % IV SOLN
INTRAVENOUS | Status: DC
Start: 2018-02-20 — End: 2018-02-24
  Administered 2018-02-20 – 2018-02-23 (×5): via INTRAVENOUS

## 2018-02-20 MED ORDER — ONDANSETRON HCL 4 MG/2ML IJ SOLN
4.0000 mg | Freq: Four times a day (QID) | INTRAMUSCULAR | Status: DC | PRN
  Administered 2018-02-25: 4 mg via INTRAVENOUS
  Filled 2018-02-20: qty 2

## 2018-02-20 MED ORDER — CARVEDILOL 12.5 MG PO TABS
12.5000 mg | ORAL_TABLET | Freq: Two times a day (BID) | ORAL | Status: DC
  Administered 2018-02-20 – 2018-02-25 (×11): 12.5 mg via ORAL
  Filled 2018-02-20 (×11): qty 1

## 2018-02-20 MED ORDER — HYDROMORPHONE HCL 1 MG/ML IJ SOLN
1.0000 mg | Freq: Once | INTRAMUSCULAR | Status: AC
  Administered 2018-02-20: 1 mg via INTRAVENOUS
  Filled 2018-02-20: qty 1

## 2018-02-20 MED ORDER — ONDANSETRON HCL 4 MG PO TABS
4.0000 mg | ORAL_TABLET | Freq: Four times a day (QID) | ORAL | Status: DC | PRN
  Administered 2018-02-21 – 2018-02-22 (×2): 4 mg via ORAL
  Filled 2018-02-20 (×2): qty 1

## 2018-02-20 MED ORDER — ENOXAPARIN SODIUM 30 MG/0.3ML ~~LOC~~ SOLN
30.0000 mg | SUBCUTANEOUS | Status: DC
  Administered 2018-02-21 – 2018-02-25 (×5): 30 mg via SUBCUTANEOUS
  Filled 2018-02-20 (×5): qty 0.3

## 2018-02-20 MED ORDER — IOPAMIDOL (ISOVUE-300) INJECTION 61%
INTRAVENOUS | Status: AC
  Filled 2018-02-20: qty 30

## 2018-02-20 MED ORDER — IOHEXOL 300 MG/ML  SOLN
100.0000 mL | Freq: Once | INTRAMUSCULAR | Status: AC | PRN
  Administered 2018-02-20: 100 mL via INTRAVENOUS

## 2018-02-20 MED ORDER — SODIUM CHLORIDE 0.9 % IV BOLUS
500.0000 mL | Freq: Once | INTRAVENOUS | Status: AC
  Administered 2018-02-20: 500 mL via INTRAVENOUS

## 2018-02-20 MED ORDER — SODIUM CHLORIDE 0.9 % IV SOLN
1.0000 g | Freq: Once | INTRAVENOUS | Status: AC
  Administered 2018-02-20: 1 g via INTRAVENOUS
  Filled 2018-02-20: qty 10

## 2018-02-20 MED ORDER — HYDROCODONE-ACETAMINOPHEN 10-325 MG PO TABS
1.0000 | ORAL_TABLET | ORAL | Status: DC | PRN
  Administered 2018-02-20 – 2018-02-26 (×15): 1 via ORAL
  Filled 2018-02-20 (×15): qty 1

## 2018-02-20 MED ORDER — FENTANYL CITRATE (PF) 100 MCG/2ML IJ SOLN
12.5000 ug | INTRAMUSCULAR | Status: DC | PRN
  Administered 2018-02-21 – 2018-02-24 (×12): 12.5 ug via INTRAVENOUS
  Filled 2018-02-20 (×12): qty 2

## 2018-02-20 MED ORDER — FENTANYL CITRATE (PF) 100 MCG/2ML IJ SOLN
50.0000 ug | Freq: Once | INTRAMUSCULAR | Status: AC
  Administered 2018-02-20: 50 ug via INTRAVENOUS
  Filled 2018-02-20: qty 2

## 2018-02-20 NOTE — ED Provider Notes (Addendum)
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 (ATIVAN) 2 MG/ML injection (  Duplicate 1/76/16 0737)  lip balm (CARMEX) ointment (  Given 03/07/18 1233)     Initial Impression / Assessment and Plan / ED Course  I have reviewed the triage vital signs and the nursing notes.  Pertinent labs & imaging results that were available during my care of the patient were reviewed by me and considered in my medical decision making (see chart for details).     62 year old female with a h/o retroperitoneal sarcoma s/p resection and radiation and cervical CA s/p radiation  and TAH/BSO in [place], [place], and HTN presenting with abdominal bloating for 1 week and constipation for 3 days.  X-ray performed by primary care yesterday was concerning for small bowel obstruction.  She is passing flatus.  On exam, abdomen is nontender, but the right and left lower quadrants are moderately distended and are hard to palpation,  left greater than right.  Bilateral upper abdomen is soft.  Initial lactate 2.20. Tachycarica in the 110s. Afebrile.  IV fluid bolus given.  Fentanyl given for pain control.  Mild hyponatremia of 129 and hypo-chloremia, which should correct with IV fluid bolus.  Mild leukocytosis of 12.8, which could be hemoconcentrated since the patient appears dehydrated.  She also has a thrombocytosis of 6 to C7, which is likely reactive.  Creatinine 1.23 also secondary to clinical dehydration.  Patient continued to have severe pain and was given multiple doses of IV Dilaudid.  She would become tachycardic in the 120s when pain would worsen and would improve when pain was controlled.  She was given a second IV fluid bolus and dose of Rocephin for UTI.  CT with significant progression of left pelvic sarcoma causing bilateral hydronephrosis, left greater than right, compression of distal small bowel loops resulting in at least partial small bowel/colonic obstruction, likely obstruction of the left iliac venous system, and extraperitoneal spread along the bilateral abdominal wall including the Sister Wynona Dove node.  Spoke with Dr. Irene Limbo with oncology who recommended getting surgery involved in the patient's care.  He will speak with her primary oncologist so the patient can be seen tomorrow.  Spoke with Dr. Brantley Stage who recommended keeping the patient NPO, but since this the patient is not actively vomiting NG tube is not required at this time.  Surgery will follow. Spoke with Dr. Earney Hamburg, Family Medicine who will accept the patient for admission. The patient appears reasonably stabilized for admission considering the current resources, flow, and capabilities available in the ED at this time, and I doubt any other [facility] requiring further screening and/or treatment in the ED prior to admission.  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

## 2018-02-20 NOTE — Consult Note (Signed)
Reason for Consult:sbo Referring Physician: McDiarmid MD   Anita Whitehead is an 62 y.o. female.  HPI: Asked to see the patient at the request of Dr. Wendy Poet for a small bowel obstruction.  The patient has a history of a pelvic sarcoma which was operated on in April 2019 at [facility] by Dr. Clovis Riley.  She received preoperative radiation therapy and subsequent resection.  The patient does not think the entire tumor was removed.  She presents with a one-week history of abdominal bloating, nausea, and some vomiting.  She is passing flatus and having bowel movements.  CT scan shows rapid progression of previous left pelvic sarcoma causing displacement of internal viscera leading to partial small bowel obstruction.  Tumor extends down her left femoral canal occluding her left femoral vein it looks like.  Patient mostly has fatigue and bloating is her chief complaint.  She denies any pain in her extremities.  She has been seen by oncology.  She is still passing flatus.  She has a poor appetite.  Past Medical History:  Diagnosis Date  . Allergy   . Cervical cancer ([place]) 1989  . History of colon polyps    TA   . Hyperlipidemia   . Hypertension   . PMS (premenstrual syndrome)     Past Surgical History:  Procedure Laterality Date  . ABDOMINAL HYSTERECTOMY    . adenomatous colon polyps    . COLONOSCOPY     04-01-2009,2006 with [place] [facility]   . POLYPECTOMY      Family History  Problem Relation Age of Onset  . Diabetes Brother   . Hypertension Mother   . Ovarian cancer Mother 32  . Hypertension Sister   . Breast cancer Sister 18  . Lung cancer Father 63  . Colon cancer Neg Hx   . Colon polyps Neg Hx   . Esophageal cancer Neg Hx   . Rectal cancer Neg Hx   . Stomach cancer Neg Hx     Social History:  reports that she has quit smoking. She has never used smokeless tobacco. She reports that she drinks alcohol. She reports that she does not use drugs.  Allergies: No  Known Allergies  Medications: I have reviewed the patient's current medications.  Results for orders placed or performed during the hospital encounter of 02/16/2018 (from the past 48 hour(s))  I-Stat CG4 Lactic Acid, ED     Status: Abnormal   Collection Time: 02/28/2018 12:14 PM  Result Value Ref Range   Lactic Acid, Venous 1.91 (H) 0.5 - 1.9 mmol/L  Lipase, blood     Status: None   Collection Time: 02/04/2018 12:56 PM  Result Value Ref Range   Lipase 22 11 - 51 U/L    Comment: Performed at [facility], [place] [address] [place], [place] [address]  Comprehensive metabolic panel     Status: Abnormal   Collection Time: 02/07/2018 12:56 PM  Result Value Ref Range   Sodium 129 (L) 135 - 145 mmol/L   Potassium 3.8 3.5 - 5.1 mmol/L   Chloride 92 (L) 98 - 111 mmol/L    Comment: Please note change in reference range.   CO2 24 22 - 32 mmol/L   Glucose, Bld 98 70 - 99 mg/dL    Comment: Please note change in reference range.   BUN 20 6 - 20 mg/dL    Comment: Please note change in reference range.   Creatinine, Ser 1.23 (H) 0.44 - 1.00 mg/dL  Calcium 8.9 8.9 - 10.3 mg/dL   Total Protein 6.7 6.5 - 8.1 g/dL   Albumin 2.8 (L) 3.5 - 5.0 g/dL   AST 16 15 - 41 U/L   ALT 10 0 - 44 U/L    Comment: Please note change in reference range.   Alkaline Phosphatase 70 38 - 126 U/L   Total Bilirubin 0.4 0.3 - 1.2 mg/dL   GFR calc non Af Amer 47 (L) >60 mL/min   GFR calc Af Amer 55 (L) >60 mL/min    Comment: (NOTE) The eGFR has been calculated using the CKD EPI equation. This calculation has not been validated in all clinical situations. eGFR's persistently <60 mL/min signify possible Chronic Kidney Disease.    Anion gap 13 5 - 15    Comment: Performed at [facility] [address] [place], [place] [address]  CBC     Status: Abnormal   Collection Time: 02/10/2018 12:56 PM  Result Value Ref Range   WBC 12.8 (H) 4.0 - 10.5 K/uL   RBC 4.36 3.87 - 5.11 MIL/uL   Hemoglobin 10.9 (L) 12.0 - 15.0  g/dL   HCT 33.8 (L) 36.0 - 46.0 %   MCV 77.5 (L) 78.0 - 100.0 fL   MCH 25.0 (L) 26.0 - 34.0 pg   MCHC 32.2 30.0 - 36.0 g/dL   RDW 18.9 (H) 11.5 - 15.5 %   Platelets 667 (H) 150 - 400 K/uL    Comment: Performed at [facility] [address], [place], [place] [address]  I-Stat CG4 Lactic Acid, ED     Status: Abnormal   Collection Time: 02/13/2018  1:33 PM  Result Value Ref Range   Lactic Acid, Venous 2.20 (HH) 0.5 - 1.9 mmol/L   Comment NOTIFIED PHYSICIAN   Urinalysis, Routine w reflex microscopic     Status: Abnormal   Collection Time: 02/08/2018  1:54 PM  Result Value Ref Range   Color, Urine YELLOW YELLOW   APPearance CLOUDY (A) CLEAR   Specific Gravity, Urine 1.005 1.005 - 1.030   pH 5.0 5.0 - 8.0   Glucose, UA NEGATIVE NEGATIVE mg/dL   Hgb urine dipstick SMALL (A) NEGATIVE   Bilirubin Urine NEGATIVE NEGATIVE   Ketones, ur NEGATIVE NEGATIVE mg/dL   Protein, ur NEGATIVE NEGATIVE mg/dL   Nitrite NEGATIVE NEGATIVE   Leukocytes, UA LARGE (A) NEGATIVE   RBC / HPF 0-5 0 - 5 RBC/hpf   WBC, UA 11-20 0 - 5 WBC/hpf   Bacteria, UA MANY (A) NONE SEEN   Squamous Epithelial / LPF 0-5 0 - 5   WBC Clumps PRESENT     Comment: Performed at [facility], [place] [address] [place],  [address]    Ct Abdomen Pelvis W Contrast  Result Date: 02/26/2018 CLINICAL DATA:  62 year old female with a history of bloated sensation with diarrhea. Known left pelvis sarcoma, with the electronic records indicating treatment at [facility]. Biopsy was performed previously 06/01/2017. EXAM: CT ABDOMEN AND PELVIS WITH CONTRAST TECHNIQUE: Multidetector CT imaging of the abdomen and pelvis was performed using the standard protocol following bolus administration of intravenous contrast. CONTRAST:  139m OMNIPAQUE IOHEXOL 300 MG/ML  SOLN COMPARISON:  MR 05/31/2017, CT 06/02/2015 FINDINGS: Lower chest: Lower chest unremarkable. No pleural effusion or nodules of the lung bases. Unremarkable visualized cardiac  structures. Unremarkable chest wall. Hepatobiliary: Unremarkable appearance of liver parenchyma. Unremarkable gallbladder. Pancreas: Unremarkable pancreas. Spleen: Unremarkable spleen Adrenals/Urinary Tract: Unremarkable adrenal glands. Right kidney demonstrates moderate hydronephrosis  and dilation of the proximal right ureter. The right ureter is inseparable from soft tissue in the right anatomic pelvis, as the ureter crosses the iliac vasculature. Left kidney with moderate to large hydronephrosis. The left ureter is visualized proximally to be circumferentially involved with retroperitoneal tumor. There is loss of visualization of the left ureter distally. Tumor tissue involves the inferior aspect of the left kidney and the lateral aspect of the left kidney, with tissue extending superiorly to the inferior margin of the spleen. Stomach/Bowel: Unremarkable stomach. Tumor tissues inseparable from the posterior aspect of the stomach. Small bowel loops have been displaced towards the patient's right given the volume of tumor in the left retroperitoneum. Tissue of the rectum is inseparable from tumor tissue in the recto uterine space. The rectum/sigmoid rectal junction is displaced to the patient's right secondary to tumor tissue. The descending colon is up lifted and displaced, with the margins of sigmoid colon inseparable from tumor tissue in the abdomen/pelvis. The posterior aspects of the cecum is intimately associated with tumor tissue in the right anatomic pelvis. Unremarkable appendix. Proximal colon is somewhat distended into the transverse colon. The splenic flexure is not well visualized. Enteric contrast through the length of small bowel, with mild dilation of small bowel loops. Small bowel loops within the right low abdomen are inseparable from tumor tissue at the level of the sacrum/sacral base. Vascular/Lymphatic: Atherosclerotic changes of the abdominal aorta. The left iliac system is circumferential  involved with tumor tissue including both the distal common iliac artery, hypogastric artery, and external iliac artery which is uplifted through its course in the pelvis. The common femoral artery is circumferential involved with tumor in the proximal thigh. The left iliac vein is not visualized. Tumor tissue encroaches on the distal aorta just above the bifurcation, at the level of the inferior mesenteric artery origin. Mesenteric arteries of the low abdomen are displaced secondary to tumor tissue at the sacral base. Soft tissue implants overlying the right-sided rectus femora S measures 15 mm. Tumor tissue is inseparable from the left-sided abdominal musculature below the umbilicus, with tumor tissue at the umbilicus (sister Wynona Dove node). Reproductive: Hysterectomy. Tumor tissue occupies the anatomic pelvis, inseparable from the posterior urinary bladder and the rectum within the recto uterine space. Other: Interval enlargement of tumor within the left retroperitoneum/extra peritoneum, with tumor now extending from the left abdominal musculature medially to the lumbar spine and aorta, up lifting the bowel and sigmoid colon. Tumor extends superior along the lateral kidney within the perinephric space and extra peritoneum to the spleen. Tumor extends inferiorly within the gluteal musculature and the anatomic pelvis into the proximal thigh. Musculoskeletal: Irregularity of the left iliac bone with rarefaction of the bone adjacent to the tumor. Osteopenia throughout the musculoskeletal system. No acute fracture identified. IMPRESSION: CT demonstrates significant progression of known left pelvic sarcoma. The tumor demonstrates predominantly retroperitoneal and extraperitoneal growth, occupying the left retroperitoneum, with extension superiorly to the inferior spleen, lateral to the kidney, medially to the infrarenal aorta and lumbar spine, inferior medially into the anatomic pelvis, and inferior laterally into  the proximal left thigh. Sequela include: -left moderate to large hydronephrosis -mild to moderate right hydronephrosis -compression of distal small bowel loops and rectosigmoid junction, resulting in at least partial small bowel obstruction/colonic obstruction -circumferential involvement of the left iliac arteries including common, internal iliac, external iliac, and common femoral artery -likely obstruction of the left iliac venous system -extraperitoneal spread along the bilateral abdominal wall musculature, including lymphatic  involvement at the umbilicus (sister Wynona Dove node). These results were called by telephone at the time of interpretation on 02/09/2018 at 5:25 pm to Dr. M[facility]e [facility] , who verbally acknowledged these results. Electronically Signed   By: Corrie Mckusick D.O.   On: 02/16/2018 17:25    Review of Systems  Constitutional: Positive for malaise/fatigue. Negative for diaphoresis.  HENT: Negative.   Eyes: Negative.   Respiratory: Negative.   Cardiovascular: Negative.   Gastrointestinal: Positive for constipation, nausea and vomiting. Negative for abdominal pain.  Genitourinary: Negative.   Musculoskeletal: Negative.   Skin: Negative.   Neurological: Negative.   Endo/Heme/Allergies: Negative.   Psychiatric/Behavioral: Negative.    Blood pressure (!) 132/93, pulse (!) 117, temperature 98.5 F (36.9 C), temperature source Oral, resp. rate 18, height _0  (1.6 m), weight 48.5 kg (107 lb), SpO2 99 %. Physical Exam  Constitutional: She is oriented to person, place, and time. She appears well-developed and well-nourished.  HENT:  Head: Normocephalic and atraumatic.  Eyes: Pupils are equal, round, and reactive to light. EOM are normal.  Neck: Normal range of motion. Neck supple.  Cardiovascular: Normal rate and regular rhythm.  Respiratory: Effort normal and breath sounds normal.  GI: Soft. She exhibits distension. There is no tenderness. There is no rebound and no guarding.   Midline scar noted.  Hard left lower quadrant.  No rebound or guarding.  Mass left lower quadrant.  Scar extends into the left femoral canal  Musculoskeletal: Normal range of motion.  Neurological: She is alert and oriented to person, place, and time.  Skin: Skin is warm.  Psychiatric: She has a normal mood and affect. Her behavior is normal.    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  The patient is not vomiting nor in any distress.  N.p.o. for tonight.  If she has no further vomiting may start clears tomorrow.  We will check a KUB in a.m.  She may require transfer back to [facility] at some point if surgical therapy necessary  Turner Daniels 02/19/2018, 8:37 PM

## 2018-02-20 NOTE — ED Triage Notes (Signed)
Pt endorses feeling  Bloated with episodes of diarrhea x 4 days. Went for an x-ray yesterday and was called with results this morning to come to ER for CT scan for possible small bowel obstruction. Denies vomiting. Tachy.

## 2018-02-20 NOTE — ED Notes (Signed)
Pt ambulatory to bathroom with assistance.

## 2018-02-20 NOTE — H&P (Addendum)
[facility] Admission History and Physical Service Pager: [phone]  Patient name: Anita Whitehead Medical record number: [id number] Date of birth: 1955-11-24 Age: 62 y.o. Gender: female  Primary Care Provider: [name], NP Consultants: Surgery, Oncology Code Status: FULL  Chief Complaint: abdominal bloating  Assessment and Plan: Anita Whitehead is a 62 y.o. female presenting with abdominal bloating and pain. 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  Partial small bowel obstruction: Patient seen in her PCP's office yesterday due to increased abdominal distention x1 week.  Abdominal x-ray was suggestive of possible small bowel obstruction.  CT on 7/17 showed compression of distal small bowel loops and rectosigmoid junction resulting in at least partial small bowel obstruction/colonic obstruction.  Patient reports some diarrhea but has not had a bowel movement since Friday.  She had a few episodes of vomiting last week when she was bloated and had feelings of fullness.  She notes that she has had decreased oral intake since her diagnosis in October and notes decreased appetite.  Surgery was consulted in the ED, and recommended that she be n.p.o. for tonight.  She notes that her pain is currently well controlled.  She states that pain was 5 out of 10 prior to receiving pain medication in the ED.  Patient tachycardic on presentation to the ED, 100-135. -Admit to telemetry, Anita Whitehead's service -Monitor vitals -Out of bed with assistance -NPO per surgery recommendations -Start clear liquids tomorrow if no further vomiting -Recheck KUB in a.m. 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  Followed by Dr. Alvy Whitehead and Anita Whitehead oncology, Anita Whitehead   Surgery consulted in ED.  Resection in April following radiation treatments, CT on 7/17 shows significant progression of known left pelvic sarcoma with bilateral hydronephrosis, left greater than right and partial small bowel obstruction.  Patient has noticed increased abdominal distention x 1 week associated with abdominal pain that has worsened over the past few days.  Oncology consulted in the ED, patient's primary oncologist is not on-call this evening.  She will be seen by her oncologist in the morning.  Patient has not yet been started on chemotherapy, she was scheduled for follow-up in September to determine need for chemotherapy. -Follow-up oncology recommendations in the a.m. -Lovenox for DVT prophylaxis due to hypercoagulable state from malignancy -Fentanyl PRN for pain control - May need transfer to Anita Whitehead for further management  Tachycardia: likely 2/2 dehydration, poor intake, and pain.  - continue to monitor on telemetry - EKG in am  Hyponatremia: Sodium 129 on presentation.  Likely secondary to dehydration from poor oral intake.  No confusion or changes in mental status. -D5 normal saline 125 cc/h -Follow-up BMP in a.m.  AKI Cr today 1.23.  On 6/21 0.74, yesterday was 1.1.  Patient given total 1L fluids in ED. bilateral hydronephrosis, left greater than right shown on CT on 7/17. -Continue fluid resuscitation, D5 NS 125 cc/h -Follow-up BMP in a.m. -Consider nephrology consultation as needed - Avoid nephrotoxic agents, ensure adequate renal perfusion- holding home gabapentin she recently started  Lactic Acidosis: 1.91 on presentation to ED, repeat in 1 hour was 2.2.  Likely 2/2 to poor PO intake.   -Fluid resuscitation, D5 normal saline 125 cc/h -Repeat lactic acid, to ensure trending down  Leukocytosis WBC  12.8, up from 11, 4 months ago.  Likely due to malignancy versus UTI.  -Follow-up CBC in a.m.  Anemia: hemoglobin 10.9 in the ED, down from 12.5, 4 months ago.   Denies melena, hematochezia.  Denies chest pain, shortness of breath. -Follow-up CBC in a.m.  Thrombocytosis: platelets 667 in the ED, up from 322 4 months ago. -Follow-up CBC in a.m. -Lovenox for DVT prophylaxis  UTI: positive for a large leukocytes and many bacteria.  Received 1 dose of Rocephin in ED. patient denies dysuria, hematuria, increased frequency or urgency. -No further treatment indicated due to asymptomatic bacteriuria  Left Leg Weakness: Chronic.  Patient has 3 out of 5 strength in the left lower extremity following tumor resection in April.  She notes that part of the tumor was attached to her femoral nerve.  She has been working with physical therapy due to weakness.  She uses a walker for ambulation.  She denies any new weakness. -PT/OT  HTN: Chronic, stable.  118/88 on presentation to ED.  Has remained normotensive.  Coreg at home. -Continue home Coreg  Hx Cervical Cancer, S/P TAH 30 years ago.  Patient states that she received radiation therapy prior to surgery.  FEN/Whitehead: NPO Prophylaxis: Lovenox  Disposition: Admit to Telemetry  History of Present Illness:  Anita Whitehead is a 62 y.o. female presenting with abdominal bloating and pain x 1 week.  She has a known left sided retroperitoneal sarcoma and is followed by Dr. Alvy Whitehead as her primary oncologist. [facility] gave a second opinion, and [facility] with Dr. Octavia Whitehead who was a specialist in sarcomas. She was seen by her PCP yesterday for this complaint.  Abdominal X-ray was suggestive of possible small bowel obstruction and patient was told to come to ED.  She is reporting some abdominal pain that has been worsening over the past few days.   She has had tightness and bloating since last Monday. Reports some vomiting last week when she was bloated and had a feeling of fullness. She also reports some diarrhea, but no bowel movement since Friday. Patient reports that she does not eat a lot and has been a slow eater, and  it only worsened after her diagnosis last October.   Prior to this week, patient reports that she has been feeling good. She is doing physical therapy 3 times a week and recently started a part time job. She reports the surgery was performed on her L groin on 12/03/17. Reports they thought all of the tumor was removed, but they were going to recheck it 04/2018 and consider chemotherapy at that time. She has not received any chemotherapy, but received radiation in November-January for 28 doses.   Patient also received radiation 28 years ago from cervical cancer and had a total hysterectomy at that time.    Review Of Systems: Per HPI with the following additions:   Review of Systems  Constitutional: Positive for weight loss. Negative for chills and fever.  Eyes: Positive for blurred vision.  Respiratory: Negative for cough and shortness of breath.   Cardiovascular: Negative for chest pain.  Gastrointestinal: Positive for abdominal pain, diarrhea and vomiting. Negative for blood in stool and melena.  Genitourinary: Negative for dysuria, frequency, hematuria and urgency.  Skin: Negative for rash.  Neurological: Negative for headaches.       Weakness in L leg    Patient Active Problem List   Diagnosis Date Noted  . Partial small bowel obstruction ([place]) 02/22/2018  . Left leg  weakness 12/04/2017  . Dehydration 10/19/2017  . Nausea with vomiting 10/19/2017  . Left leg swelling 07/13/2017  . Drug induced constipation 07/06/2017  . 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[place]) 06/15/2017  . 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 Pelvic mass in female 05/31/2017  . Essential hypertension 05/06/2014  . Atrophic vaginitis 04/02/2012  . Routine general medical examination at a health care facility 04/02/2012  . HEARTBURN 02/24/2009  . 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 Hyperlipidemia 05/14/2007  . ALLERGIC RHINITIS 05/14/2007    Past Medical  History: Past Medical History:  Diagnosis Date  . Allergy   . Cervical cancer ([place]) 1989  . History of colon polyps    TA   . Hyperlipidemia   . Hypertension   . PMS (premenstrual syndrome)     Past Surgical History: Past Surgical History:  Procedure Laterality Date  . ABDOMINAL HYSTERECTOMY    . adenomatous colon polyps    . COLONOSCOPY     04-01-2009,2006 with [facility]   . POLYPECTOMY      Social History: Social History   Tobacco Use  . Smoking status: Former Smoker    Last attempt to quit: 02/21/2015    Years since quitting: 3.0  . Smokeless tobacco: Never Used  . Tobacco comment: hooka-STOPPED 2016  Substance Use Topics  . Alcohol use: Not Currently    Alcohol/week: 0.0 oz    Comment: 3 beers a week and 2 cocktails during the week- 1 drink a day. Last drink: About a month ago.   . Drug use: No   Additional social history: Stopped smoking 3 years ago, social smoker. No alcohol in 1 month, social drinker. Smoked some marijuana about 1 month ago to help with appetite. Please also refer to relevant sections of EMR.  Family History: Family History  Problem Relation Age of Onset  . Diabetes Brother   . Seizures Brother   . Hypertension Mother   . Ovarian cancer Mother 46  . Hypertension Sister   . Breast cancer Sister 52  . Lung cancer Father 75  . Hypertension Sister   . Colon cancer Neg Hx   . Colon polyps Neg Hx   . Esophageal cancer Neg Hx   . Rectal cancer Neg Hx   . Stomach cancer Neg Hx    Allergies and Medications: No Known Allergies No current facility-administered medications on file prior to encounter.    Current Outpatient Medications on File Prior to Encounter  Medication Sig Dispense Refill  . carvedilol (COREG) 12.5 MG tablet Take 12.5 mg by mouth 2 (two) times daily.  2  . cyclobenzaprine (FLEXERIL) 10 MG tablet Take 1 tablet (10 mg total) by mouth 3 (three) times daily as needed for muscle spasms. 60 tablet 3  . docusate sodium (COLACE)  100 MG capsule Take 2 capsules (200 mg total) by mouth 2 (two) times daily. (Patient taking differently: Take 200 mg by mouth daily as needed. ) 60 capsule 3  . feeding supplement, ENSURE ENLIVE, (ENSURE ENLIVE) LIQD Take 237 mLs by mouth 2 (two) times daily between meals. 237 mL 12  . ferrous sulfate 325 (65 FE) MG EC tablet Take 325 mg by mouth daily.  3  . gabapentin (NEURONTIN) 300 MG capsule Take 1 capsule (300 mg total) by mouth 3 (three) times daily. (Patient taking differently: Take 300 mg by mouth at bedtime. ) 90 capsule 1  . HYDROcodone-acetaminophen (NORCO) 10-325 MG tablet Take 1  tablet by mouth every 6 (six) hours as needed. 90 tablet 0  . megestrol (MEGACE ES) 625 MG/5ML suspension Take 5 mLs by mouth daily.  3  . morphine (MS CONTIN) 15 MG 12 hr tablet Take 1 tablet (15 mg total) by mouth every 12 (twelve) hours. (Patient taking differently: Take 15 mg by mouth as needed. ) 60 tablet 0  . ondansetron (ZOFRAN) 8 MG tablet Take 1 tablet (8 mg total) by mouth every 8 (eight) hours as needed for nausea. 30 tablet 3  . prochlorperazine (COMPAZINE) 10 MG tablet Take 1 tablet (10 mg total) by mouth every 6 (six) hours as needed for nausea or vomiting. 30 tablet 1  . senna (SENOKOT) 8.6 MG TABS tablet Take 2 tablets (17.2 mg total) by mouth 2 (two) times daily. 120 each 9  . Vitamin D, Ergocalciferol, (DRISDOL) 50000 units CAPS capsule Take 50,000 Units by mouth once a week.    . simvastatin (ZOCOR) 10 MG tablet Take 1 tablet (10 mg total) by mouth at bedtime. (Patient not taking: Reported on 02/21/2018) 30 tablet 1    Objective: BP (!) 134/91 (BP Location: Right Arm)   Pulse (!) 113   Temp 98.7 F (37.1 C) (Oral)   Resp 16   Ht 5\' 3"  (1.6 m)   Wt 104 lb 15 oz (47.6 kg)   SpO2 100%   BMI 18.59 kg/m    Physical Exam  Constitutional: No distress.  HENT:  Head: Normocephalic and atraumatic.  Oropharynx clear, dry  Cardiovascular: Regular rhythm.  No murmur heard. Tachycardic   Respiratory: Effort normal and breath sounds normal. No respiratory distress. She has no wheezes. She has no rales.  Whitehead: Bowel sounds are normal. She exhibits mass (LLQ hard mass, non-tender).  Midline lower abdominal scar that extends to left leg  Musculoskeletal: She exhibits no edema.  5/5 strength RLE, 3/5 strength LLE, sensation intact BLE  Skin: Skin is warm and dry. She is not diaphoretic.   Labs and Imaging: CBC BMET  Recent Labs  Lab 02/15/2018 1256  WBC 12.8*  HGB 10.9*  HCT 33.8*  PLT 667*   Recent Labs  Lab 02/19/2018 1256  NA 129*  K 3.8  CL 92*  CO2 24  BUN 20  CREATININE 1.23*  GLUCOSE 98  CALCIUM 8.9     Ct Abdomen Pelvis W Contrast  Result Date: 02/06/2018 CLINICAL DATA:  62 year old female with a history of bloated sensation with diarrhea. Known left pelvis sarcoma, with the electronic records indicating treatment at [facility] Biopsy was performed previously 06/01/2017. EXAM: CT ABDOMEN AND PELVIS WITH CONTRAST TECHNIQUE: Multidetector CT imaging of the abdomen and pelvis was performed using the standard protocol following bolus administration of intravenous contrast. CONTRAST:  154mL OMNIPAQUE IOHEXOL 300 MG/ML  SOLN COMPARISON:  MR 05/31/2017, CT 06/02/2015 FINDINGS: Lower chest: Lower chest unremarkable. No pleural effusion or nodules of the lung bases. Unremarkable visualized cardiac structures. Unremarkable chest wall. Hepatobiliary: Unremarkable appearance of liver parenchyma. Unremarkable gallbladder. Pancreas: Unremarkable pancreas. Spleen: Unremarkable spleen Adrenals/Urinary Tract: Unremarkable adrenal glands. Right kidney demonstrates moderate hydronephrosis and dilation of the proximal right ureter. The right ureter is inseparable from soft tissue in the right anatomic pelvis, as the ureter crosses the iliac vasculature. Left kidney with moderate to large hydronephrosis. The left ureter is visualized proximally to be circumferentially involved with  retroperitoneal tumor. There is loss of visualization of the left ureter distally. Tumor tissue involves the inferior aspect of the left kidney and the lateral  aspect of the left kidney, with tissue extending superiorly to the inferior margin of the spleen. Stomach/Bowel: Unremarkable stomach. Tumor tissues inseparable from the posterior aspect of the stomach. Small bowel loops have been displaced towards the patient's right given the volume of tumor in the left retroperitoneum. Tissue of the rectum is inseparable from tumor tissue in the recto uterine space. The rectum/sigmoid rectal junction is displaced to the patient's right secondary to tumor tissue. The descending colon is up lifted and displaced, with the margins of sigmoid colon inseparable from tumor tissue in the abdomen/pelvis. The posterior aspects of the cecum is intimately associated with tumor tissue in the right anatomic pelvis. Unremarkable appendix. Proximal colon is somewhat distended into the transverse colon. The splenic flexure is not well visualized. Enteric contrast through the length of small bowel, with mild dilation of small bowel loops. Small bowel loops within the right low abdomen are inseparable from tumor tissue at the level of the sacrum/sacral base. Vascular/Lymphatic: Atherosclerotic changes of the abdominal aorta. The left iliac system is circumferential involved with tumor tissue including both the distal common iliac artery, hypogastric artery, and external iliac artery which is uplifted through its course in the pelvis. The common femoral artery is circumferential involved with tumor in the proximal thigh. The left iliac vein is not visualized. Tumor tissue encroaches on the distal aorta just above the bifurcation, at the level of the inferior mesenteric artery origin. Mesenteric arteries of the low abdomen are displaced secondary to tumor tissue at the sacral base. Soft tissue implants overlying the right-sided rectus femora S  measures 15 mm. Tumor tissue is inseparable from the left-sided abdominal musculature below the umbilicus, with tumor tissue at the umbilicus (sister Wynona Dove node). Reproductive: Hysterectomy. Tumor tissue occupies the anatomic pelvis, inseparable from the posterior urinary bladder and the rectum within the recto uterine space. Other: Interval enlargement of tumor within the left retroperitoneum/extra peritoneum, with tumor now extending from the left abdominal musculature medially to the lumbar spine and aorta, up lifting the bowel and sigmoid colon. Tumor extends superior along the lateral kidney within the perinephric space and extra peritoneum to the spleen. Tumor extends inferiorly within the gluteal musculature and the anatomic pelvis into the proximal thigh. Musculoskeletal: Irregularity of the left iliac bone with rarefaction of the bone adjacent to the tumor. Osteopenia throughout the musculoskeletal system. No acute fracture identified. IMPRESSION: CT demonstrates significant progression of known left pelvic sarcoma. The tumor demonstrates predominantly retroperitoneal and extraperitoneal growth, occupying the left retroperitoneum, with extension superiorly to the inferior spleen, lateral to the kidney, medially to the infrarenal aorta and lumbar spine, inferior medially into the anatomic pelvis, and inferior laterally into the proximal left thigh. Sequela include: -left moderate to large hydronephrosis -mild to moderate right hydronephrosis -compression of distal small bowel loops and rectosigmoid junction, resulting in at least partial small bowel obstruction/colonic obstruction -circumferential involvement of the left iliac arteries including common, internal iliac, external iliac, and common femoral artery -likely obstruction of the left iliac venous system -extraperitoneal spread along the bilateral abdominal wall musculature, including lymphatic involvement at the umbilicus (sister Wynona Dove  node). These results were called by telephone at the time of interpretation on 02/22/2018 at 5:25 pm to Dr. M[facility] , who verbally acknowledged these results. Electronically Signed   By: Corrie Mckusick D.O.   On: 02/25/2018 17:25    Rittberger, Bernita Raisin, DO 02/05/2018, 11:49 PM PGY-1, [place] Intern pager: [phone], text pages welcome  FPTS Upper-Level Resident Addendum   I have independently interviewed and examined the patient. I have discussed the above with the original author and agree with their documentation. My edits for correction/addition/clarification are in purple. Please see also any attending notes.    Martinique Lakendria Nicastro, DO PGY-2, [facility] 02/17/2018 11:52 PM  [place] Service pager: [phone] (text pages welcome through [place])

## 2018-02-20 NOTE — ED Notes (Signed)
Patient transported to CT 

## 2018-02-21 ENCOUNTER — Encounter (HOSPITAL_COMMUNITY): Payer: Self-pay | Admitting: Family Medicine

## 2018-02-21 ENCOUNTER — Inpatient Hospital Stay (HOSPITAL_COMMUNITY): Payer: 59

## 2018-02-21 DIAGNOSIS — Z681 Body mass index (BMI) 19 or less, adult: Secondary | ICD-10-CM

## 2018-02-21 DIAGNOSIS — K56609 Unspecified intestinal obstruction, unspecified as to partial versus complete obstruction: Secondary | ICD-10-CM

## 2018-02-21 DIAGNOSIS — G5792 Unspecified mononeuropathy of left lower limb: Secondary | ICD-10-CM | POA: Diagnosis present

## 2018-02-21 DIAGNOSIS — I10 Essential (primary) hypertension: Secondary | ICD-10-CM

## 2018-02-21 DIAGNOSIS — E871 Hypo-osmolality and hyponatremia: Secondary | ICD-10-CM | POA: Diagnosis present

## 2018-02-21 DIAGNOSIS — D630 Anemia in neoplastic disease: Secondary | ICD-10-CM

## 2018-02-21 DIAGNOSIS — C495 Malignant neoplasm of connective and soft tissue of pelvis: Secondary | ICD-10-CM

## 2018-02-21 DIAGNOSIS — R609 Edema, unspecified: Secondary | ICD-10-CM

## 2018-02-21 DIAGNOSIS — M7989 Other specified soft tissue disorders: Secondary | ICD-10-CM

## 2018-02-21 DIAGNOSIS — D473 Essential (hemorrhagic) thrombocythemia: Secondary | ICD-10-CM

## 2018-02-21 DIAGNOSIS — E785 Hyperlipidemia, unspecified: Secondary | ICD-10-CM

## 2018-02-21 DIAGNOSIS — R64 Cachexia: Secondary | ICD-10-CM

## 2018-02-21 DIAGNOSIS — C480 Malignant neoplasm of retroperitoneum: Principal | ICD-10-CM

## 2018-02-21 DIAGNOSIS — N3000 Acute cystitis without hematuria: Secondary | ICD-10-CM

## 2018-02-21 DIAGNOSIS — R8271 Bacteriuria: Secondary | ICD-10-CM | POA: Diagnosis present

## 2018-02-21 DIAGNOSIS — Z87891 Personal history of nicotine dependence: Secondary | ICD-10-CM

## 2018-02-21 DIAGNOSIS — Z923 Personal history of irradiation: Secondary | ICD-10-CM

## 2018-02-21 LAB — CBC
HEMATOCRIT: 29.8 % — AB (ref 36.0–46.0)
HEMOGLOBIN: 9.7 g/dL — AB (ref 12.0–15.0)
MCH: 25.5 pg — AB (ref 26.0–34.0)
MCHC: 32.6 g/dL (ref 30.0–36.0)
MCV: 78.2 fL (ref 78.0–100.0)
PLATELETS: 601 10*3/uL — AB (ref 150–400)
RBC: 3.81 MIL/uL — ABNORMAL LOW (ref 3.87–5.11)
RDW: 19.1 % — ABNORMAL HIGH (ref 11.5–15.5)
WBC: 14.2 10*3/uL — ABNORMAL HIGH (ref 4.0–10.5)

## 2018-02-21 LAB — BASIC METABOLIC PANEL
ANION GAP: 10 (ref 5–15)
BUN: 14 mg/dL (ref 6–20)
CHLORIDE: 99 mmol/L (ref 98–111)
CO2: 20 mmol/L — AB (ref 22–32)
Calcium: 8.3 mg/dL — ABNORMAL LOW (ref 8.9–10.3)
Creatinine, Ser: 1.01 mg/dL — ABNORMAL HIGH (ref 0.44–1.00)
GFR calc non Af Amer: 60 mL/min — ABNORMAL LOW (ref >60)
Glucose, Bld: 94 mg/dL (ref 70–99)
POTASSIUM: 4.3 mmol/L (ref 3.5–5.1)
Sodium: 129 mmol/L — ABNORMAL LOW (ref 135–145)

## 2018-02-21 LAB — LACTIC ACID, PLASMA
Lactic Acid, Venous: 1.2 mmol/L (ref 0.5–1.9)
Lactic Acid, Venous: 1.2 mmol/L (ref 0.5–1.9)

## 2018-02-21 NOTE — Progress Notes (Signed)
Patient ID: Anita Whitehead, female   DOB: 1955-11-24, 62 y.o.   MRN: [id number]       Subjective: Patient with no complaints this morning.  No nausea or vomiting.  Passing lots of flatus.  No pain.  States that full tumor was removed in April, but Dr. Clovis Riley stated that there "may be some left on the abdominal wall."  She is aware of her CT scan results.  Objective: Vital signs in last 24 hours: Temp:  [98.1 F (36.7 C)-98.7 F (37.1 C)] 98.1 F (36.7 C) (07/18 0401) Pulse Rate:  [90-135] 90 (07/18 0401) Resp:  [12-18] 12 (07/18 0401) BP: (110-145)/(78-103) 110/83 (07/18 0401) SpO2:  [96 %-100 %] 100 % (07/18 0401) Weight:  [47.6 kg (104 lb 15 oz)-48.5 kg (107 lb)] 47.6 kg (104 lb 15 oz) (07/17 2300) Last BM Date: 02/18/18  Intake/Output from previous day: 07/17 0701 - 07/18 0700 In: 500 [I.V.:500] Out: 300 [Urine:300] Intake/Output this shift: No intake/output data recorded.  PE: Abd: distended secondary to tumor burden and compression, NT, +BS  Lab Results:  Recent Labs    02/06/2018 1256 02/21/18 0235  WBC 12.8* 14.2*  HGB 10.9* 9.7*  HCT 33.8* 29.8*  PLT 667* 601*   BMET Recent Labs    03/05/2018 1256 02/21/18 0235  NA 129* 129*  K 3.8 4.3  CL 92* 99  CO2 24 20*  GLUCOSE 98 94  BUN 20 14  CREATININE 1.23* 1.01*  CALCIUM 8.9 8.3*   PT/INR No results for input(s): LABPROT, INR in the last 72 hours. CMP     Component Value Date/Time   NA 129 (L) 02/21/2018 0235   NA 135 (L) 07/24/2017 1357   K 4.3 02/21/2018 0235   K 3.9 07/24/2017 1357   CL 99 02/21/2018 0235   CO2 20 (L) 02/21/2018 0235   CO2 24 07/24/2017 1357   GLUCOSE 94 02/21/2018 0235   GLUCOSE 141 (H) 07/24/2017 1357   BUN 14 02/21/2018 0235   BUN 10.4 07/24/2017 1357   CREATININE 1.01 (H) 02/21/2018 0235   CREATININE 1.2 (H) 07/24/2017 1357   CALCIUM 8.3 (L) 02/21/2018 0235   CALCIUM 9.2 07/24/2017 1357   PROT 6.7 02/19/2018 1256   PROT 6.7 07/24/2017 1357   ALBUMIN 2.8 (L) 02/19/2018  1256   ALBUMIN 3.3 (L) 07/24/2017 1357   AST 16 02/16/2018 1256   AST 12 07/24/2017 1357   ALT 10 02/11/2018 1256   ALT 22 07/24/2017 1357   ALKPHOS 70 02/23/2018 1256   ALKPHOS 71 07/24/2017 1357   BILITOT 0.4 02/05/2018 1256   BILITOT 0.39 07/24/2017 1357   GFRNONAA 60 (L) 02/21/2018 0235   GFRAA >60 02/21/2018 0235   Lipase     Component Value Date/Time   LIPASE 22 02/28/2018 1256       Studies/Results: Ct Abdomen Pelvis W Contrast  Result Date: 02/17/2018 CLINICAL DATA:  62 year old female with a history of bloated sensation with diarrhea. Known left pelvis sarcoma, with the electronic records indicating treatment at [facility]. Biopsy was performed previously 06/01/2017. EXAM: CT ABDOMEN AND PELVIS WITH CONTRAST TECHNIQUE: Multidetector CT imaging of the abdomen and pelvis was performed using the standard protocol following bolus administration of intravenous contrast. CONTRAST:  171mL OMNIPAQUE IOHEXOL 300 MG/ML  SOLN COMPARISON:  MR 05/31/2017, CT 06/02/2015 FINDINGS: Lower chest: Lower chest unremarkable. No pleural effusion or nodules of the lung bases. Unremarkable visualized cardiac structures. Unremarkable chest wall. Hepatobiliary: Unremarkable appearance of liver parenchyma. Unremarkable gallbladder. Pancreas: Unremarkable pancreas.  Spleen: Unremarkable spleen Adrenals/Urinary Tract: Unremarkable adrenal glands. Right kidney demonstrates moderate hydronephrosis and dilation of the proximal right ureter. The right ureter is inseparable from soft tissue in the right anatomic pelvis, as the ureter crosses the iliac vasculature. Left kidney with moderate to large hydronephrosis. The left ureter is visualized proximally to be circumferentially involved with retroperitoneal tumor. There is loss of visualization of the left ureter distally. Tumor tissue involves the inferior aspect of the left kidney and the lateral aspect of the left kidney, with tissue extending superiorly to the inferior  margin of the spleen. Stomach/Bowel: Unremarkable stomach. Tumor tissues inseparable from the posterior aspect of the stomach. Small bowel loops have been displaced towards the patient's right given the volume of tumor in the left retroperitoneum. Tissue of the rectum is inseparable from tumor tissue in the recto uterine space. The rectum/sigmoid rectal junction is displaced to the patient's right secondary to tumor tissue. The descending colon is up lifted and displaced, with the margins of sigmoid colon inseparable from tumor tissue in the abdomen/pelvis. The posterior aspects of the cecum is intimately associated with tumor tissue in the right anatomic pelvis. Unremarkable appendix. Proximal colon is somewhat distended into the transverse colon. The splenic flexure is not well visualized. Enteric contrast through the length of small bowel, with mild dilation of small bowel loops. Small bowel loops within the right low abdomen are inseparable from tumor tissue at the level of the sacrum/sacral base. Vascular/Lymphatic: Atherosclerotic changes of the abdominal aorta. The left iliac system is circumferential involved with tumor tissue including both the distal common iliac artery, hypogastric artery, and external iliac artery which is uplifted through its course in the pelvis. The common femoral artery is circumferential involved with tumor in the proximal thigh. The left iliac vein is not visualized. Tumor tissue encroaches on the distal aorta just above the bifurcation, at the level of the inferior mesenteric artery origin. Mesenteric arteries of the low abdomen are displaced secondary to tumor tissue at the sacral base. Soft tissue implants overlying the right-sided rectus femora S measures 15 mm. Tumor tissue is inseparable from the left-sided abdominal musculature below the umbilicus, with tumor tissue at the umbilicus (sister Wynona Dove node). Reproductive: Hysterectomy. Tumor tissue occupies the anatomic  pelvis, inseparable from the posterior urinary bladder and the rectum within the recto uterine space. Other: Interval enlargement of tumor within the left retroperitoneum/extra peritoneum, with tumor now extending from the left abdominal musculature medially to the lumbar spine and aorta, up lifting the bowel and sigmoid colon. Tumor extends superior along the lateral kidney within the perinephric space and extra peritoneum to the spleen. Tumor extends inferiorly within the gluteal musculature and the anatomic pelvis into the proximal thigh. Musculoskeletal: Irregularity of the left iliac bone with rarefaction of the bone adjacent to the tumor. Osteopenia throughout the musculoskeletal system. No acute fracture identified. IMPRESSION: CT demonstrates significant progression of known left pelvic sarcoma. The tumor demonstrates predominantly retroperitoneal and extraperitoneal growth, occupying the left retroperitoneum, with extension superiorly to the inferior spleen, lateral to the kidney, medially to the infrarenal aorta and lumbar spine, inferior medially into the anatomic pelvis, and inferior laterally into the proximal left thigh. Sequela include: -left moderate to large hydronephrosis -mild to moderate right hydronephrosis -compression of distal small bowel loops and rectosigmoid junction, resulting in at least partial small bowel obstruction/colonic obstruction -circumferential involvement of the left iliac arteries including common, internal iliac, external iliac, and common femoral artery -likely obstruction of the left  iliac venous system -extraperitoneal spread along the bilateral abdominal wall musculature, including lymphatic involvement at the umbilicus (sister Wynona Dove node). These results were called by telephone at the time of interpretation on 02/16/2018 at 5:25 pm to Dr. [facility] , who verbally acknowledged these results. Electronically Signed   By: Corrie Mckusick D.O.   On: 03/01/2018 17:25      Anti-infectives: Anti-infectives (From admission, onward)   Start     Dose/Rate Route Frequency Ordered Stop   02/11/2018 1445  cefTRIAXone (ROCEPHIN) 1 g in sodium chloride 0.9 % 100 mL IVPB     1 g 200 mL/hr over 30 Minutes Intravenous  Once 02/15/2018 1432 02/13/2018 1515       Assessment/Plan Pelvic sarcoma, with recurrence and psbo -patient s/p resection in April by Dr. Clovis Riley.  Now with significant rapid recurrence.  Patient has not been able to undergo chemotherapy since due to weight loss and poor nutrition, etc.  She now appears to either has a psbo or more like tumor compression on her bowels causing her intermittent symptoms.  Her film this morning shows contrast in her colon.  She is asymptomatic currently.  She can have clear liquids.  We discussed that she may require a soft/pureed type diet going forward secondary to the compression of the tumor on her bowels.  She is aware of this finding and this concept for her diet.  No plans for surgical intervention at this time.  If she were to need that, she would need [place] [facility] back to [place] - clear liquids VTE - Lovenox ID - none   LOS: 1 day    Henreitta Cea , [facility] Surgery 02/21/2018, 8:59 AM Pager: [phone]

## 2018-02-21 NOTE — Progress Notes (Signed)
Patient ID: Anita Whitehead, female   DOB: Nov 27, 1955, 62 y.o.   MRN: [id number]   Port a cath requested in IR  Pt with wbc 14.2 (12.8.yesterday) +UTI Afeb  IR can either schedule for Mon and wait for labs to be in better shape and UTI to clear Or MD to consider performed as OP  Will recheck chart in am

## 2018-02-21 NOTE — Consult Note (Signed)
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 The patient is slowly improving since admission Obviously, her bowel obstruction has resolve before we can start chemotherapy We discussed referral back to [facility] but the patient declined I recommend port placement along with baseline echocardiogram I will order CT scan of the chest for complete staging If she gets discharged over the next few days, I will schedule her to meet me back in the office early next week for further chemotherapy discussion However, if her bowel obstruction does not resolve, we might have to consider transferring her to [facility] to begin inpatient chemotherapy  Subacute bowel obstruction Overall, she is improving and able to tolerate some clear diet I would defer to primary service to advance her diet as tolerated If she gets discharged over the next few days, I will schedule outpatient follow-up for further discussion about chemotherapy  Anemia Thrombocytosis Likely related to recurrence of malignancy Iron deficiency cannot be excluded and I will order some iron studies tomorrow  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Moderate protein calorie malnutrition She will continue oral diet as tolerated  Goals of care She is aware that her cancer is incurable but is interested to try palliative treatment if possible  Discharge planning Will defer to primary service If her bowel obstruction resolves, she can defer treatment to outpatient I will return to follow-up tomorrow  CHIEF COMPLAINTS/PURPOSE OF CONSULTATION:  Recurrent sarcoma, subacute bowel obstruction  HISTORY OF PRESENTING ILLNESS:  Anita Whitehead 61 y.o. female is here because of signs and symptoms of bowel obstruction secondary to recurrent of disease The patient is well-known to me. I have reviewed her  chart and materials related to her cancer extensively and collaborated history with the patient. Summary of oncologic history is as follows:   Retroperitoneal sarcoma (HCC)   05/31/2017 Imaging    MR pelvis 1. 9.6 x 6.3 x 11.9 cm heterogeneous mass within the left pelvis at the level of the left iliacus as above. Primary differential considerations consist of possible sarcomatous tumor, potentially arising from the left iliacus muscle. In neurogenic tumor could also be considered. Further evaluation with histologic sampling recommended, as this lesion would likely be amenable to percutaneous sampling. 2. Degenerative disc bulging and facet arthropathy at L4-5 and L5-S1 with resultant mild canal with moderate left subarticular stenosis. No frank neural impingement within the lumbar spine      05/31/2017 - 06/12/2017 Hospital Admission    She had recurrent admission to the hospital due to severe pain.  She underwent CT-guided biopsy and pain management      06/01/2017 Procedure    Technically successful CT-guided core biopsy, left pelvic mass      06/01/2017 Pathology Results    Soft tissue mass, biopsy, Left Pelvic Mass - MALIGNANT SPINDLE CELL PROLIFERATION. - SEE COMMENT. Microscopic Comment The biopsy fragments reveal an atypical spindle cell proliferation. The lesion shows increased cellularity around large caliber blood vessels. There is moderate cytologic atypia as well as mitoses. The tumor cells are positive for CD34, desmin, S100, and focally positive for p63. They are negative for smooth muscle myosin, smooth muscle actin, CD99, CD31, CD117, calponin, EMA, cytokeratin 8/18 and cytokeratin AE1/AE3. The immunohistochemical profile is non-specific; however, the differential diagnosis includes a sarcoma such as a malignant peripheral nerve sheath tumor. Dr. John Patrick has reviewed the case and is in essential agreement   with this interpretation. (JBK:ah 10/30-31/18)      07/06/2017 -   Radiation Therapy    She received preoperative radiation therapy at [facility]       10/17/2017 Imaging    CHEST: .Thoracic inlet/axilla: Within normal limits. .Central airways: Within normal limits. .Mediastinum/hila: Within normal limits.  .Heart/vessel: Normal heart size. No pericardial effusion. Aorta normal in caliber and appearance. No pulmonary embolism.  .Lungs: A linear subpleural nodule at the right lower lobe on series 4 image 132 is unchanged. .Pleura: Within normal limits.   ABDOMEN: .Liver: Within normal limits. .Gallbladder/biliary: Within normal limits. .Spleen: Within normal limits. .Pancreas: Within normal limits. .Adrenals: Within normal limits. .Kidneys: Severe left hydronephrosis with delayed nephrogram. .Peritoneum: Within normal limits. .Mesentery: Within normal limits. .Extraperitoneum: The left psoas muscle is involved by the large extraperitoneal mass which is described below. .GI tract: Nonobstructed. .Vascular: Within normal limits.  PELVIS: .Peritoneum: Within normal limits. .Extraperitoneum: Left pelvic mass measuring up to 13.7 x 6.5 cm transaxial, extending from the level of the left lower kidney, involving the iliopsoas musculature, occupying the left iliac fossa. The mass extends into the left inguinal region as well, involving the proximal rectus femoris muscle.  The left external iliac artery is displaced anteriorly by the mass, while the left internal iliac artery courses posterior to the mass.. .Ureters: Within normal limits. .Bladder: Within normal limits. .Reproductive system: Within normal limits. .Vascular: Vessels surrounding the left pelvic mass described above. Additionally, these left common iliac vein is compressed by the tumor. The left femoral vein is patent, but no assessment is made of the veins within the pelvis.  MSK: .The left pelvic lesion is closely applied to the iliac wing and  there is a focus of likely erosion on series 2 image 172. Otherwise the iliac wing appears intact.      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 The tumor mitotic rate is 10 mitosis/ 10 HPFs. Tumor necrosis is present, representing approximately 60-70% of the tumor. Angiolymphatic invasion is not identified. MPNST involving the distal, lateral, medial and deep surgical margins      11/13/2017 Surgery    She has gross resection of her disease      Since the last time I saw her, she was improving with outpatient therapy and rehab Her appetite was good She denies worsening pain Approximately a week ago, she started to notice abdominal bloating and changes in bowel habits She had a small frequent bowel movement followed by constipation She was evaluated by her primary care doctor who eventually order imaging study and was noted to have significant signs of cancer progression along with subacute bowel obstruction She was being admitted for further management Since admission, she felt better She denies nausea or vomiting She is able to tolerate some clear diet She denies significant pain The patient denies any recent signs or symptoms of bleeding such as spontaneous epistaxis, hematuria or hematochezia.   MEDICAL HISTORY:  Past Medical History:  Diagnosis Date  . ALLERGIC RHINITIS 05/14/2007   Qualifier: Diagnosis of  By: Callahan CMA, Jacqualynn    . Allergy   . Cervical cancer (HCC) 1989  . History of colon polyps    TA   . Hyperlipidemia   . Hypertension   . Pelvic mass in female 05/31/2017  . PMS (premenstrual syndrome)     SURGICAL HISTORY: Past Surgical   History:  Procedure Laterality Date  . ABDOMINAL HYSTERECTOMY    . adenomatous colon polyps    . COLONOSCOPY     04-01-2009,2006 with [facility]   . POLYPECTOMY    .  RESECTION OF ABDOMINAL MASS N/A 11/13/2017   Surgery at [facility]. 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 URETERAL REIMPLANTION Left 4/919   Surgeon Ronald Davis at [facility]. Left ureteral reimplantation with Boari flap; 2. Left 8 x 26 cm ureteral stent placement; 3. Cystotomy closure    SOCIAL HISTORY: Social History   Socioeconomic History  . Marital status: Married    Spouse name: James  . Number of children: 1  . Years of education: Not on file  . Highest education level: Not on file  Occupational History  . Occupation: financial director  Social Needs  . Financial resource strain: Not on file  . Food insecurity:    Worry: Not on file    Inability: Not on file  . Transportation needs:    Medical: Not on file    Non-medical: Not on file  Tobacco Use  . Smoking status: Former Smoker    Last attempt to quit: 02/21/2015    Years since quitting: 3.0  . Smokeless tobacco: Never Used  . Tobacco comment: hooka-STOPPED 2016  Substance and Sexual Activity  . Alcohol use: Not Currently    Alcohol/week: 0.0 oz    Comment: 3 beers a week and 2 cocktails during the week- 1 drink a day. Last drink: About a month ago.   . Drug use: No  . Sexual activity: Yes    Birth control/protection: None    Comment: TAH  Lifestyle  . Physical activity:    Days per week: Not on file    Minutes per session: Not on file  . Stress: Not on file  Relationships  . Social connections:    Talks on phone: Not on file    Gets together: Not on file    Attends religious service: Not on file    Active member of club or organization: Not on file    Attends meetings of clubs or organizations: Not on file    Relationship status: Not on file  . Intimate partner violence:    Fear of current or ex partner: Not on file    Emotionally abused: Not on file     Physically abused: Not on file    Forced sexual activity: Not on file  Other Topics Concern  . Not on file  Social History Narrative   Finance officer for [employer]   Married for 16 years   2 boys, one in [place] and one in [place]    FAMILY HISTORY: Family History  Problem Relation Age of Onset  . Diabetes Brother   . Seizures Brother   . Hypertension Mother   . Ovarian cancer Mother 50  . Hypertension Sister   . Breast cancer Sister 52  . Lung cancer Father 63  . Hypertension Sister   . Colon cancer Neg Hx   . Colon polyps Neg Hx   . Esophageal cancer Neg Hx   . Rectal cancer Neg Hx   . Stomach cancer Neg Hx     ALLERGIES:  has No Known Allergies.  MEDICATIONS:  Current Facility-Administered Medications  Medication Dose Route Frequency Provider Last Rate Last Dose  .   carvedilol (COREG) tablet 12.5 mg  12.5 mg Oral BID Shirley, Jordan, DO   12.5 mg at 02/21/18 0954  . dextrose 5 %-0.9 % sodium chloride infusion   Intravenous Continuous Shirley, Jordan, DO 125 mL/hr at 02/21/18 1517    . enoxaparin (LOVENOX) injection 30 mg  30 mg Subcutaneous Q24H Shirley, Jordan, DO   30 mg at 02/21/18 0954  . fentaNYL (SUBLIMAZE) injection 12.5 mcg  12.5 mcg Intravenous Q2H PRN Shirley, Jordan, DO      . HYDROcodone-acetaminophen (NORCO) 10-325 MG per tablet 1 tablet  1 tablet Oral Q4H PRN Shirley, Jordan, DO   1 tablet at 02/21/18 1251  . ondansetron (ZOFRAN) tablet 4 mg  4 mg Oral Q6H PRN Shirley, Jordan, DO       Or  . ondansetron (ZOFRAN) injection 4 mg  4 mg Intravenous Q6H PRN Shirley, Jordan, DO        REVIEW OF SYSTEMS:   Constitutional: Denies fevers, chills or abnormal night sweats Eyes: Denies blurriness of vision, double vision or watery eyes Ears, nose, mouth, throat, and face: Denies mucositis or sore throat Respiratory: Denies cough, dyspnea or wheezes Cardiovascular: Denies palpitation, chest discomfort or lower extremity swelling Skin:  Denies abnormal skin rashes Lymphatics: Denies new lymphadenopathy or easy bruising Neurological:Denies numbness, tingling or new weaknesses Behavioral/Psych: Mood is stable, no new changes  All other systems were reviewed with the patient and are negative.  PHYSICAL EXAMINATION: ECOG PERFORMANCE STATUS: 1 - Symptomatic but completely ambulatory  Vitals:   02/21/18 0845 02/21/18 1558  BP: 121/86 126/84  Pulse: 94 98  Resp: 13 20  Temp: 98.2 F (36.8 C) 98 F (36.7 C)  SpO2: 100% 100%   Filed Weights   02/14/2018 1121 02/26/2018 2300  Weight: 107 lb (48.5 kg) 104 lb 15 oz (47.6 kg)    GENERAL:alert, no distress and comfortable. she is thin.   SKIN: skin color, texture, turgor are normal, no rashes or significant lesions EYES: normal, conjunctiva are pink and non-injected, sclera clear OROPHARYNX:no exudate, no erythema and lips, buccal mucosa, and tongue normal  NECK: supple, thyroid normal size, non-tender, without nodularity LYMPH:  no palpable lymphadenopathy in the cervical, axillary or inguinal LUNGS: clear to auscultation and percussion with normal breathing effort HEART: regular rate & rhythm and no murmurs and no lower extremity edema ABDOMEN:abdomen soft, non-tender and normal bowel sounds Musculoskeletal:no cyanosis of digits and no clubbing  PSYCH: alert & oriented x 3 with fluent speech NEURO: no focal motor/sensory deficits  LABORATORY DATA:  I have reviewed the data as listed Lab Results  Component Value Date   WBC 14.2 (H) 02/21/2018   HGB 9.7 (L) 02/21/2018   HCT 29.8 (L) 02/21/2018   MCV 78.2 02/21/2018   PLT 601 (H) 02/21/2018   Recent Labs    06/22/17 0818  09/10/17 0907 10/19/17 1354 02/15/2018 1256 02/21/18 0235  NA 134*   < > 137 135* 129* 129*  K 4.7   < > 3.9 4.1 3.8 4.3  CL 99  --  103 99 92* 99  CO2 25   < > 23 26 24 20*  GLUCOSE 82   < > 80 121 98 94  BUN 14   < > 14 16 20 14  CREATININE 0.64   < > 0.84 0.97 1.23* 1.01*  CALCIUM 9.9   <  > 9.8 10.4 8.9 8.3*  GFRNONAA  --   --  >60 >60 47* 60*  GFRAA  --   --  >  60 >60 55* >60  PROT 7.0   < > 7.0 8.0 6.7  --   ALBUMIN 4.4   < > 3.8 3.7 2.8*  --   AST 19   < > _0 --   ALT 95*   < > _1 --   ALKPHOS 67   < > 73 80 70  --   BILITOT 0.5   < > 0.6 0.3 0.4  --   BILIDIR 0.1  --   --   --   --   --    < > = values in this interval not displayed.    RADIOGRAPHIC STUDIES: I have personally reviewed the radiological images as listed and agreed with the findings in the report. Dg Abd 1 View  Result Date: 02/21/2018 CLINICAL DATA:  Abdominal pain and distention for 2 days EXAM: ABDOMEN - 1 VIEW COMPARISON:  CT abdomen pelvis of 02/10/2018 FINDINGS: FINDINGS Prominent soft tissue throughout the left flank appears to be due to the CT demonstrated retroperitoneal tumor apparently sarcoma, much of the bowel is pushed to the right abdomen. No bowel obstruction is seen. Some contrast is noted within distal small bowel loops without distension. Surgical clips are noted overlying the pelvis. Sclerotic lesions are present throughout the bones of the pelvis consistent with metastasis. IMPRESSION: 1. Paucity of bowel gas throughout the left flank most consistent with the CT demonstrated retroperitoneal soft tissue mass most consistent with sarcoma at by history. 2. No bowel obstruction. Some contrast node is noted within the small bowel distally. 3. Blastic pelvic bone lesions suggestive of metastasis. Electronically Signed   By: Ivar Drape M.D.   On: 02/21/2018 10:57   Ct Abdomen Pelvis W Contrast  Result Date: 02/26/2018 CLINICAL DATA:  62 year old female with a history of bloated sensation with diarrhea. Known left pelvis sarcoma, with the electronic records indicating treatment at [facility]. Biopsy was performed previously 06/01/2017. EXAM: CT ABDOMEN AND PELVIS WITH CONTRAST TECHNIQUE: Multidetector CT imaging of the abdomen and pelvis was performed using the standard protocol following  bolus administration of intravenous contrast. CONTRAST:  147m OMNIPAQUE IOHEXOL 300 MG/ML  SOLN COMPARISON:  MR 05/31/2017, CT 06/02/2015 FINDINGS: Lower chest: Lower chest unremarkable. No pleural effusion or nodules of the lung bases. Unremarkable visualized cardiac structures. Unremarkable chest wall. Hepatobiliary: Unremarkable appearance of liver parenchyma. Unremarkable gallbladder. Pancreas: Unremarkable pancreas. Spleen: Unremarkable spleen Adrenals/Urinary Tract: Unremarkable adrenal glands. Right kidney demonstrates moderate hydronephrosis and dilation of the proximal right ureter. The right ureter is inseparable from soft tissue in the right anatomic pelvis, as the ureter crosses the iliac vasculature. Left kidney with moderate to large hydronephrosis. The left ureter is visualized proximally to be circumferentially involved with retroperitoneal tumor. There is loss of visualization of the left ureter distally. Tumor tissue involves the inferior aspect of the left kidney and the lateral aspect of the left kidney, with tissue extending superiorly to the inferior margin of the spleen. Stomach/Bowel: Unremarkable stomach. Tumor tissues inseparable from the posterior aspect of the stomach. Small bowel loops have been displaced towards the patient's right given the volume of tumor in the left retroperitoneum. Tissue of the rectum is inseparable from tumor tissue in the recto uterine space. The rectum/sigmoid rectal junction is displaced to the patient's right secondary to tumor tissue. The descending colon is up lifted and displaced, with the margins of sigmoid colon inseparable from tumor tissue in the abdomen/pelvis. The posterior aspects of the cecum is intimately associated  with tumor tissue in the right anatomic pelvis. Unremarkable appendix. Proximal colon is somewhat distended into the transverse colon. The splenic flexure is not well visualized. Enteric contrast through the length of small bowel, with  mild dilation of small bowel loops. Small bowel loops within the right low abdomen are inseparable from tumor tissue at the level of the sacrum/sacral base. Vascular/Lymphatic: Atherosclerotic changes of the abdominal aorta. The left iliac system is circumferential involved with tumor tissue including both the distal common iliac artery, hypogastric artery, and external iliac artery which is uplifted through its course in the pelvis. The common femoral artery is circumferential involved with tumor in the proximal thigh. The left iliac vein is not visualized. Tumor tissue encroaches on the distal aorta just above the bifurcation, at the level of the inferior mesenteric artery origin. Mesenteric arteries of the low abdomen are displaced secondary to tumor tissue at the sacral base. Soft tissue implants overlying the right-sided rectus femora S measures 15 mm. Tumor tissue is inseparable from the left-sided abdominal musculature below the umbilicus, with tumor tissue at the umbilicus (sister Anita Whitehead node). Reproductive: Hysterectomy. Tumor tissue occupies the anatomic pelvis, inseparable from the posterior urinary bladder and the rectum within the recto uterine space. Other: Interval enlargement of tumor within the left retroperitoneum/extra peritoneum, with tumor now extending from the left abdominal musculature medially to the lumbar spine and aorta, up lifting the bowel and sigmoid colon. Tumor extends superior along the lateral kidney within the perinephric space and extra peritoneum to the spleen. Tumor extends inferiorly within the gluteal musculature and the anatomic pelvis into the proximal thigh. Musculoskeletal: Irregularity of the left iliac bone with rarefaction of the bone adjacent to the tumor. Osteopenia throughout the musculoskeletal system. No acute fracture identified. IMPRESSION: CT demonstrates significant progression of known left pelvic sarcoma. The tumor demonstrates predominantly  retroperitoneal and extraperitoneal growth, occupying the left retroperitoneum, with extension superiorly to the inferior spleen, lateral to the kidney, medially to the infrarenal aorta and lumbar spine, inferior medially into the anatomic pelvis, and inferior laterally into the proximal left thigh. Sequela include: -left moderate to large hydronephrosis -mild to moderate right hydronephrosis -compression of distal small bowel loops and rectosigmoid junction, resulting in at least partial small bowel obstruction/colonic obstruction -circumferential involvement of the left iliac arteries including common, internal iliac, external iliac, and common femoral artery -likely obstruction of the left iliac venous system -extraperitoneal spread along the bilateral abdominal wall musculature, including lymphatic involvement at the umbilicus (sister Anita Whitehead node). These results were called by telephone at the time of interpretation on 02/19/2018 at 5:25 pm to Dr. Maree Erie [facility] , who verbally acknowledged these results. Electronically Signed   By: Corrie Mckusick D.O.   On: 02/23/2018 17:25   All questions were answered. The patient knows to call the clinic with any problems, questions or concerns.  [name], MD 02/21/2018 4:03 PM

## 2018-02-21 NOTE — Progress Notes (Signed)
Preliminary notes--Bilateral lower extremities venous duplex exam completed.  Negative for DVT.  Incidental finding:   There is an ill-defined border heterogenous lesion with vascular supply feature, measuring 4.56x3.14cm at the proximal thigh region where CFV/SFJ located. Etiology unknown.  Anita Whitehead (RDMS RVT) 02/21/18 3:29 PM

## 2018-02-21 NOTE — Progress Notes (Signed)
Family Medicine Teaching Service Daily Progress Note Intern Pager: [phone]  Patient name: Anita Whitehead Medical record number: [id number] Date of birth: 14-Aug-1955 Age: 62 y.o. Gender: female  Primary Care Provider: [name], NP Consultants: Surgery, Oncology Code Status: FULL  Pt Overview and Major Events to Date:  7/17 - admit  Assessment and Plan: Loys Hayworth is a 62 year old female presenting with abdominal bloating and 2/2 small bowel obstruction. PMH is significant for left-sided retroperitoneal sarcoma.   Partialsmall bowel obstruction: 2/2 pelvic Sarcoma. Patient passing gas and is without vomiting.Condition improving. -Admit to telemetry, Dr.McDiarmid'sservice -Monitor vitals -Out of bed with assistance -Full liquids 7/19 -KUB 7/18: no small bowel obstruction, left-sided retroperitoneal soft tissue mass -Per oncology - recommends port placement, baseline Echo, will follow up regarding chemo discussions; appreciate recommendations! 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 Followed byDr. Alvy Bimler and [facility] consulted in ED. - Follow-up oncology recommendations in the a.m. - Lovenox for DVT prophylaxis due to hypercoagulable state from malignancy - Fentanyl PRN for pain control  Asymptomatic Bacteruria: patient grew Klebsiella on U.Cx (7/18), received Rocephin x1 for E. Coli in Urine in ED. - patient without frequency, urgency, dysuria - no treatment at this time  Tachycardia: likely 2/2 dehydration, poor intake, and pain.  - continue to monitor on telemetry - EKG 7/18: normal sinus with nonspecific t-wave abnormality, unchanged  Hyponatremia:Na 129 on presentation, 2/2 Dehydration for poor PO.No confusion, AMS -D5 normal saline 125 cc/h -Follow-up BMP 7/18: Na unchanged  AKI, improving: Cr 1.23 > 1.01. On 6/21 0.74, yesterday was 1.1. Bilateral  hydronephrosis, L > R shown on CT on 7/17. - Continue fluid resuscitation,D5NS125 cc/h - Follow-up BMP 7/18: improved Cr. - Consider nephrology consultation as needed - Avoid nephrotoxic agents, ensure adequate renal perfusion   Lactic Acidosis, resolved:1.91 > 2.2 > 1.2 nml(7/18) - Fluid resuscitation, D5 normal saline 125 cc/h - Repeat LA?  LeukocytosisWBC 12.8, up from 11,4 months ago. Likely due to malignancy versus UTI. - Follow-up CBC in a.m.  Anemia:hemoglobin 10.9 in the ED, down from 12.5,4 months ago. Denies melena, hematochezia. Denies chest pain, shortness of breath. - Follow-up CBC in a.m.  Thrombocytosis:platelets 667 in the ED, up from 322 4 months ago. - Follow-up CBC in a.m. - Lovenox for DVT prophylaxis  NOT:RRNHAFBX for a large leukocytes and many bacteria. Received 1 dose of Rocephin in ED.Patient denies dysuria, hematuria, increased frequency or urgency. - No further treatment indicated due to asymptomatic bacteriuria  Left Leg Weakness:Chronic.Patient has 3 out of 5 strength in the left lower extremity following tumor resection in April. She notes that part of the tumor was attached to her femoral nerve. She has been working with physical therapy due to weakness. She uses a walker for ambulation. She denies any new weakness. - PT/OT  HTN: Chronic, stable.118/88 on presentation to ED. Has remained normotensive.Coreg at home. - Continue home Coreg  Hx Cervical Cancer, S/P TAH30 years ago. Patient states that she received radiation therapy prior to surgery.  FEN/GI:NPO Prophylaxis:Lovenox  Disposition:Admit to Telemetry  Subjective:  Patient seen this morning reading in bed in NAD. She reports feeling well this morning and that she is passing gas, but no bowel movement. She is consuming full liquids today but has not started them yet. She denies headaches, fevers, chest pain, shortness of breath, diarrhea, dysuria,  frequency, and weakness. She is aware of the growing sarcoma but remains a positive, upbeat person.  She says she is aware of the plan for chemo and the needed steps to make that happen.  Objective: Vitals:   02/21/18 2058 02/22/18 0442  BP: (!) 136/93 130/86  Pulse: 97 92  Resp: 16 16  Temp: 98.2 F (36.8 C) 98.3 F (36.8 C)  SpO2: 100% 100%   Physical Exam: Constitutional: She is oriented to person, place, and time. She appears well-developed.  Non-toxic appearance. She appears ill (frail).  Head: Normocephalic and atraumatic.  Eyes: EOM are normal. No scleral icterus.  Cardiovascular: Normal rate.  Pulmonary/Chest: Effort normal and breath sounds normal.  Abdominal: Bowel sounds are normal. There is no hepatosplenomegaly. There is no tenderness. No hernia.  Firm mass palpable in left abdomen, esp LLQ. Vertical abdominal scar from previous surgeries, bowel sounds in right abdomen and RUQ, none auscultated in LLQ due to mass.  Neurological: She is alert and oriented to person, place, and time.  Skin: Skin is warm and dry. Capillary refill takes less than 2 seconds.  Psychiatric: She has a normal mood and affect. Her behavior is normal  Laboratory: Vitals:   02/21/18 2058 02/22/18 0442  BP: (!) 136/93 130/86  Pulse: 97 92  Resp: 16 16  Temp: 98.2 F (36.8 C) 98.3 F (36.8 C)  SpO2: 100% 100%   CMP Latest Ref Rng & Units 02/21/2018 02/07/2018 10/19/2017  Glucose 70 - 99 mg/dL 94 98 121  BUN 6 - 20 mg/dL '14 20 16  '$ Creatinine 0.44 - 1.00 mg/dL 1.01(H) 1.23(H) 0.97  Sodium 135 - 145 mmol/L 129(L) 129(L) 135(L)  Potassium 3.5 - 5.1 mmol/L 4.3 3.8 4.1  Chloride 98 - 111 mmol/L 99 92(L) 99  CO2 22 - 32 mmol/L 20(L) 24 26  Calcium 8.9 - 10.3 mg/dL 8.3(L) 8.9 10.4  Total Protein 6.5 - 8.1 g/dL - 6.7 8.0  Total Bilirubin 0.3 - 1.2 mg/dL - 0.4 0.3  Alkaline Phos 38 - 126 U/L - 70 80  AST 15 - 41 U/L - 16 14  ALT 0 - 44 U/L - 10 10   Urine Cx (7/17): 100,000+ colonies Klebsiella  Pneumoniae Lipase (7/17): 22 nml Lactic Acid: 1.91 > 2.2 > 1.2 > 1.2 (7/18) Urinalysis (7/17): cloudy with small hgb, large leukocytes and many bacteria ESR (7/18): 50 H Iron (7/18): 12 L TIBC (7/18): 167 L Ferritin (7/18): 257 N CBC w/ Diff (7/18):  Ref Range & Units 7/19 7/18 7/21 November 2017  WBC 4.0 - 10.5 K/uL 13.0High   14.2High   12.8High   11.0High  R  RBC 3.87 - 5.11 MIL/uL 3.75Low   3.81Low   4.36  4.27 R  Hemoglobin 12.0 - 15.0 g/dL 9.3Low   9.7Low   10.9Low   12.5 R  HCT 36.0 - 46.0 % 31.2Low   29.8Low   33.8Low   37.8 R  MCV 78.0 - 100.0 fL 83.2  78.2  77.5Low   88.5 R  MCH 26.0 - 34.0 pg 24.8Low   25.5Low   25.0Low   29.3 R  MCHC 30.0 - 36.0 g/dL 29.8Low   32.6  32.2  33.1 R  RDW 11.5 - 15.5 % 20.2High   19.1High   18.9High   13.4 R  Platelets 150 - 400 K/uL 508High   601High  CM 667High  CM 322 R  Neutrophils Relative % % 85    85   Neutro Abs 1.7 - 7.7 K/uL 11.1High     9.5High  R  Lymphocytes Relative % 6  7   Lymphs Abs 0.7 - 4.0 K/uL 0.8    0.7Low  R  Monocytes Relative % 7    7   Monocytes Absolute 0.1 - 1.0 K/uL 1.0    0.7 R  Eosinophils Relative % 1    1   Eosinophils Absolute 0.0 - 0.7 K/uL 0.1    0.1 R  Basophils Relative % 0    0   Basophils Absolute 0.0 - 0.1 K/uL 0.0    0.0 CM   Imaging/Diagnostic Tests: Dg Abd 1 View  Result Date: 02/21/2018 CLINICAL DATA:  Abdominal pain and distention for 2 days EXAM: ABDOMEN - 1 VIEW COMPARISON:  CT abdomen pelvis of 02/15/2018 FINDINGS: FINDINGS Prominent soft tissue throughout the left flank appears to be due to the CT demonstrated retroperitoneal tumor apparently sarcoma, much of the bowel is pushed to the right abdomen. No bowel obstruction is seen. Some contrast is noted within distal small bowel loops without distension. Surgical clips are noted overlying the pelvis. Sclerotic lesions are present throughout the bones of the pelvis consistent with metastasis. IMPRESSION: 1. Paucity of bowel gas throughout the left  flank most consistent with the CT demonstrated retroperitoneal soft tissue mass most consistent with sarcoma at by history. 2. No bowel obstruction. Some contrast node is noted within the small bowel distally. 3. Blastic pelvic bone lesions suggestive of metastasis. Electronically Signed   By: Ivar Drape M.D.   On: 02/21/2018 10:57   Ct Abdomen Pelvis W Contrast: 03/04/2018 IMPRESSION: CT demonstrates significant progression of known left pelvic sarcoma. The tumor demonstrates predominantly retroperitoneal and extraperitoneal growth, occupying the left retroperitoneum, with extension superiorly to the inferior spleen, lateral to the kidney, medially to the infrarenal aorta and lumbar spine, inferior medially into the anatomic pelvis, and inferior laterally into the proximal left thigh. Sequela include: -left moderate to large hydronephrosis -mild to moderate right hydronephrosis -compression of distal small bowel loops and rectosigmoid junction, resulting in at least partial small bowel obstruction/colonic obstruction -circumferential involvement of the left iliac arteries including common, internal iliac, external iliac, and common femoral artery -likely obstruction of the left iliac venous system -extraperitoneal spread along the bilateral abdominal wall musculature, including lymphatic involvement at the umbilicus (sister Wynona Dove node). These results were called by telephone at the time of interpretation on 02/13/2018 at 5:25 pm to Dr. Maree [facility] , who verbally acknowledged these results.   Milus Banister, [place], PGY-1 02/22/2018 7:50 AM FPTS Intern pager: [phone], text pages welcome

## 2018-02-21 NOTE — Progress Notes (Signed)
Family Medicine Teaching Service Daily Progress Note Intern Pager: [phone]  Patient name: Anita Whitehead Medical record number: [id number] Date of birth: 03/11/1956 Age: 62 y.o. Gender: female  Primary Care Provider: [name], NP Consultants: Surgery, Oncology Code Status: FULL  Pt Overview and Major Events to Date:  7/17 - admit  Assessment and Plan: Anita Whitehead is a 62 year old female presenting with abdominal bloating and 2/2 small bowel obstruction. PMH is significant for left-sided retroperitoneal sarcoma.   Partial small bowel obstruction: 2/2 pelvic Sarcoma. Patient passing gas and is without vomiting.Condition improving. -Admit to telemetry, Dr. 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  Followed by Dr. Alvy Bimler and [facility], [facility]  Surgery consulted in ED. -Follow-up oncology recommendations in the a.m. -Lovenox for DVT prophylaxis due to hypercoagulable state from malignancy -Fentanyl PRN for pain control  Tachycardia: likely 2/2 dehydration, poor intake, and pain.  - continue to monitor on telemetry - EKG 7/18: normal sinus with nonspecific t-wave abnormality, unchanged  Hyponatremia: Na 129 on presentation, 2/2 Dehydration for poor PO. No confusion, AMS -D5 normal saline 125 cc/h -Follow-up BMP 7/18: Na unchanged  AKI, improving: Cr 1.23 > 1.01. On 6/21 0.74, yesterday was 1.1. Bilateral hydronephrosis, L > R shown on CT on 7/17. - Continue fluid resuscitation, D5 NS 125 cc/h - Follow-up BMP 7/18: improved Cr. - Consider nephrology consultation as needed -  Avoid nephrotoxic agents, ensure adequate renal perfusion    Lactic Acidosis, resolved: 1.91 > 2.2 > 1.2 nml (7/18) - Fluid resuscitation, D5 normal saline 125 cc/h - Repeat LA?  Leukocytosis WBC 12.8, up from 11, 4 months ago.  Likely due to malignancy versus UTI.  - Follow-up CBC in a.m.  Anemia: hemoglobin 10.9 in the ED, down from 12.5, 4 months ago.  Denies melena, hematochezia.  Denies chest pain, shortness of breath. - Follow-up CBC in a.m.  Thrombocytosis: platelets 667 in the ED, up from 322 4 months ago. - Follow-up CBC in a.m. - Lovenox for DVT prophylaxis  UTI: positive for a large leukocytes and many bacteria.  Received 1 dose of Rocephin in ED. Patient denies dysuria, hematuria, increased frequency or urgency. - No further treatment indicated due to asymptomatic bacteriuria  Left Leg Weakness: Chronic.  Patient has 3 out of 5 strength in the left lower extremity following tumor resection in April.  She notes that part of the tumor was attached to her femoral nerve.  She has been working with physical therapy due to weakness.  She uses a walker for ambulation.  She denies any new weakness. - PT/OT  HTN: Chronic, stable. 118/88 on presentation to ED.  Has remained normotensive.  Coreg at home. - Continue home Coreg  Hx Cervical Cancer, S/P TAH 30 years ago.   Patient states that she received radiation therapy prior to surgery.  FEN/GI: NPO Prophylaxis: Lovenox  Disposition: Admit to Telemetry  Subjective:  Patient seen this morning lying in bed in NAD. She reports feeling well this morning and that she is passing gas, but no bowel movement. She is consuming clear liquids. She denies headaches, fevers, chest pain, shortness of breath, diarrhea, dysuria, frequency, and weakness. She is aware of the growing sarcoma but remains a positive, upbeat  person. She says she slept well and has no questions for me at this time.  Objective: Temp:  [98.1 F (36.7 C)-98.7 F (37.1 C)] 98.1 F (36.7 C) (07/18 0401) Pulse Rate:   [90-135] 90 (07/18 0401) Resp:  [12-18] 12 (07/18 0401) BP: (110-145)/(78-103) 110/83 (07/18 0401) SpO2:  [96 %-100 %] 100 % (07/18 0401) Weight:  [104 lb 15 oz (47.6 kg)-107 lb (48.5 kg)] 104 lb 15 oz (47.6 kg) (07/17 2300) Physical Exam  Constitutional: She is oriented to person, place, and time. She appears well-developed.  Non-toxic appearance. She appears ill (frail).  HENT:  Head: Normocephalic and atraumatic.  Eyes: EOM are normal. No scleral icterus.  Cardiovascular: Normal rate.  Pulmonary/Chest: Effort normal and breath sounds normal.  Abdominal: Bowel sounds are normal. There is no hepatosplenomegaly. There is no tenderness. No hernia.  Firm mass palpable in left abdomen, esp LLQ. Vertical abdominal scar from previous surgeries, bowel sounds in right abdomen and RUQ, none auscultated in LLQ due to mass.  Neurological: She is alert and oriented to person, place, and time.  Skin: Skin is warm and dry. Capillary refill takes less than 2 seconds.  Psychiatric: She has a normal mood and affect. Her behavior is normal.   Laboratory: Recent Labs  Lab 02/19/2018 1256 02/21/18 0235  WBC 12.8* 14.2*  HGB 10.9* 9.7*  HCT 33.8* 29.8*  PLT 667* 601*   Recent Labs  Lab 02/08/2018 1256 02/21/18 0235  NA 129* 129*  K 3.8 4.3  CL 92* 99  CO2 24 20*  BUN 20 14  CREATININE 1.23* 1.01*  CALCIUM 8.9 8.3*  PROT 6.7  --   BILITOT 0.4  --   ALKPHOS 70  --   ALT 10  --   AST 16  --   GLUCOSE 98 94   Lipase (7/17): 22 nml Lactic Acid: 1.91 > 2.2 > 1.2 > 1.2 (7/18) Urinalysis (7/17): cloudy with small hgb, large leukocytes and many bacteria Urine Cx (7/17): pending  Imaging/Diagnostic Tests: Ct Abdomen Pelvis W Contrast: 03/01/2018 IMPRESSION: CT demonstrates significant progression of known left pelvic sarcoma. The tumor demonstrates predominantly retroperitoneal and extraperitoneal growth, occupying the left retroperitoneum, with extension superiorly to the inferior spleen, lateral  to the kidney, medially to the infrarenal aorta and lumbar spine, inferior medially into the anatomic pelvis, and inferior laterally into the proximal left thigh. Sequela include: -left moderate to large hydronephrosis -mild to moderate right hydronephrosis -compression of distal small bowel loops and rectosigmoid junction, resulting in at least partial small bowel obstruction/colonic obstruction -circumferential involvement of the left iliac arteries including common, internal iliac, external iliac, and common femoral artery -likely obstruction of the left iliac venous system -extraperitoneal spread along the bilateral abdominal wall musculature, including lymphatic involvement at the umbilicus (sister Wynona Dove node).   Daisy Floro, DO 02/21/2018, 6:21 AM PGY-1, [place] Intern pager: [phone], text pages welcome

## 2018-02-22 ENCOUNTER — Inpatient Hospital Stay (HOSPITAL_COMMUNITY): Payer: 59

## 2018-02-22 DIAGNOSIS — G893 Neoplasm related pain (acute) (chronic): Secondary | ICD-10-CM

## 2018-02-22 DIAGNOSIS — R8271 Bacteriuria: Secondary | ICD-10-CM

## 2018-02-22 DIAGNOSIS — N1330 Unspecified hydronephrosis: Secondary | ICD-10-CM

## 2018-02-22 DIAGNOSIS — I5030 Unspecified diastolic (congestive) heart failure: Secondary | ICD-10-CM

## 2018-02-22 DIAGNOSIS — C499 Malignant neoplasm of connective and soft tissue, unspecified: Secondary | ICD-10-CM

## 2018-02-22 DIAGNOSIS — E871 Hypo-osmolality and hyponatremia: Secondary | ICD-10-CM

## 2018-02-22 LAB — CBC WITH DIFFERENTIAL/PLATELET
Abs Immature Granulocytes: 0.2 10*3/uL — ABNORMAL HIGH (ref 0.0–0.1)
BASOS PCT: 0 %
Basophils Absolute: 0 10*3/uL (ref 0.0–0.1)
EOS ABS: 0.1 10*3/uL (ref 0.0–0.7)
EOS PCT: 1 %
HEMATOCRIT: 31.2 % — AB (ref 36.0–46.0)
Hemoglobin: 9.3 g/dL — ABNORMAL LOW (ref 12.0–15.0)
IMMATURE GRANULOCYTES: 1 %
Lymphocytes Relative: 6 %
Lymphs Abs: 0.8 10*3/uL (ref 0.7–4.0)
MCH: 24.8 pg — AB (ref 26.0–34.0)
MCHC: 29.8 g/dL — AB (ref 30.0–36.0)
MCV: 83.2 fL (ref 78.0–100.0)
MONO ABS: 1 10*3/uL (ref 0.1–1.0)
Monocytes Relative: 7 %
NEUTROS ABS: 11.1 10*3/uL — AB (ref 1.7–7.7)
Neutrophils Relative %: 85 %
PLATELETS: 508 10*3/uL — AB (ref 150–400)
RBC: 3.75 MIL/uL — ABNORMAL LOW (ref 3.87–5.11)
RDW: 20.2 % — AB (ref 11.5–15.5)
WBC: 13 10*3/uL — ABNORMAL HIGH (ref 4.0–10.5)

## 2018-02-22 LAB — SEDIMENTATION RATE: SED RATE: 50 mm/h — AB (ref 0–22)

## 2018-02-22 LAB — IRON AND TIBC
IRON: 12 ug/dL — AB (ref 28–170)
SATURATION RATIOS: 7 % — AB (ref 10.4–31.8)
TIBC: 167 ug/dL — AB (ref 250–450)
UIBC: 155 ug/dL

## 2018-02-22 LAB — FERRITIN: FERRITIN: 257 ng/mL (ref 11–307)

## 2018-02-22 LAB — URINE CULTURE: Culture: >=100000 — AB

## 2018-02-22 LAB — ECHOCARDIOGRAM COMPLETE
Height: 63 in
Weight: 1679.02 oz

## 2018-02-22 MED ORDER — SULFAMETHOXAZOLE-TRIMETHOPRIM 400-80 MG PO TABS
1.0000 | ORAL_TABLET | Freq: Two times a day (BID) | ORAL | Status: DC
  Administered 2018-02-22 – 2018-02-23 (×3): 1 via ORAL
  Filled 2018-02-22 (×5): qty 1

## 2018-02-22 MED ORDER — IOHEXOL 300 MG/ML  SOLN
100.0000 mL | Freq: Once | INTRAMUSCULAR | Status: AC | PRN
  Administered 2018-02-22: 80 mL via INTRAVENOUS

## 2018-02-22 MED ORDER — SODIUM CHLORIDE 0.9 % IV SOLN
125.0000 mg | Freq: Once | INTRAVENOUS | Status: AC
  Administered 2018-02-22: 125 mg via INTRAVENOUS
  Filled 2018-02-22: qty 10

## 2018-02-22 MED ORDER — BOOST / RESOURCE BREEZE PO LIQD CUSTOM
1.0000 | Freq: Three times a day (TID) | ORAL | Status: DC
  Administered 2018-02-22 – 2018-02-25 (×7): 1 via ORAL
  Filled 2018-02-22 (×6): qty 1

## 2018-02-22 NOTE — Progress Notes (Signed)
See IR note from yesterday. Ongoing slow to resolve SBO Also with UTI with +Klebsiella culture  Best to allow acute issues to resolve and set up port placement as outpt. Discussed with Dr. Alvy Bimler.  Ascencion Dike PA-C Interventional Radiology 02/22/2018 10:43 AM

## 2018-02-22 NOTE — Progress Notes (Signed)
  Echocardiogram 2D Echocardiogram has been performed.  Anita Whitehead 02/22/2018, 11:18 AM

## 2018-02-22 NOTE — Progress Notes (Signed)
Patient ID: Anita Whitehead, female   DOB: [birth year], 62 y.o.   MRN: [id number]   Acute Care Surgery Service Progress Note:    Chief Complaint/Subjective: No BM yesterday. No n/v Fair amount of flatus Min burping  Objective: Vital signs in last 24 hours: Temp:  [98 F (36.7 C)-98.3 F (36.8 C)] 98.3 F (36.8 C) (07/19 0442) Pulse Rate:  [92-98] 92 (07/19 0442) Resp:  [16-20] 16 (07/19 0442) BP: (126-136)/(84-93) 130/86 (07/19 0442) SpO2:  [100 %] 100 % (07/19 0442) Last BM Date: 02/22/18  Intake/Output from previous day: 07/18 0701 - 07/19 0700 In: 4171.9 [P.O.:840; I.V.:3331.9] Out: 800 [Urine:800] Intake/Output this shift: No intake/output data recorded.  Lungs: cta, nonlabored  Cardiovascular: reg  Abd: full abd, NT  Extremities: no edema, +SCDs  Neuro: alert, nonfocal  Lab Results: CBC  Recent Labs    02/21/18 0235 02/22/18 0625  WBC 14.2* 13.0*  HGB 9.7* 9.3*  HCT 29.8* 31.2*  PLT 601* 508*   BMET Recent Labs    02/13/2018 1256 02/21/18 0235  NA 129* 129*  K 3.8 4.3  CL 92* 99  CO2 24 20*  GLUCOSE 98 94  BUN 20 14  CREATININE 1.23* 1.01*  CALCIUM 8.9 8.3*   LFT Hepatic Function Latest Ref Rng & Units 02/05/2018 10/19/2017 09/10/2017  Total Protein 6.5 - 8.1 g/dL 6.7 8.0 7.0  Albumin 3.5 - 5.0 g/dL 2.8(L) 3.7 3.8  AST 15 - 41 U/L '16 14 13  '$ ALT 0 - 44 U/L '10 10 17  '$ Alk Phosphatase 38 - 126 U/L 70 80 73  Total Bilirubin 0.3 - 1.2 mg/dL 0.4 0.3 0.6  Bilirubin, Direct 0.0 - 0.3 mg/dL - - -   PT/INR No results for input(s): LABPROT, INR in the last 72 hours. ABG No results for input(s): PHART, HCO3 in the last 72 hours.  Invalid input(s): PCO2, PO2  Studies/Results:  Anti-infectives: Anti-infectives (From admission, onward)   Start     Dose/Rate Route Frequency Ordered Stop   02/16/2018 1445  cefTRIAXone (ROCEPHIN) 1 g in sodium chloride 0.9 % 100 mL IVPB     1 g 200 mL/hr over 30 Minutes Intravenous  Once 02/18/2018 1432 02/21/2018 1515        Medications: Scheduled Meds: . carvedilol  12.5 mg Oral BID  . enoxaparin (LOVENOX) injection  30 mg Subcutaneous Q24H  . feeding supplement  1 Container Oral TID BM   Continuous Infusions: . dextrose 5 % and 0.9% NaCl Stopped (02/22/18 0531)   PRN Meds:.fentaNYL (SUBLIMAZE) injection, HYDROcodone-acetaminophen, ondansetron **OR** ondansetron (ZOFRAN) IV  Assessment/Plan: Patient Active Problem List   Diagnosis Date Noted  . Dehydration with hyponatremia 02/21/2018  . Asymptomatic bacteriuria 02/21/2018  . Mononeuropathy of left lower limb 02/21/2018  . Partial small bowel obstruction ([place]) 02/04/2018  . Left leg weakness 12/04/2017  . Dehydration 10/19/2017  . Nausea with vomiting 10/19/2017  . Left leg swelling 07/13/2017  . Drug induced constipation 07/06/2017  . Retroperitoneal sarcoma ([place]) 06/15/2017  . 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 Essential hypertension 05/06/2014  . Atrophic vaginitis 04/02/2012  . HEARTBURN 02/24/2009  . 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 Clovis Riley.  Now with significant rapid recurrence.  Patient has not been able to undergo chemotherapy since due to weight loss and poor nutrition, etc.  She now appears to  have  tumor compression on her bowels causing her intermittent symptoms.  Her film yesterday showed contrast in her colon.  She is asymptomatic currently.  She tolerated clear liquids yesterday.  We discussed that she may require a soft/pureed type diet going forward secondary to the compression of the tumor on her bowels.  She is aware of this finding and this concept for her diet.  No plans for surgical intervention at this time.  If she were to need that, she would need TX back to [place] - full liquids today, added protein shakes VTE -  Lovenox ID - UTI,  Disposition: she how she does with fulls. She may be able to only handle fulls. If tolerates fulls today and has a BM, then you could try a pureed diet but she may just have to stay on fulls. If she has a BM and tolerates fulls, then I think she can be discharged with f/u per Dr Alvy Bimler note.      LOS: 2 days    Leighton Ruff. Redmond Pulling, MD, FACS General, Bariatric, & Minimally Invasive Surgery [phone] [facility]

## 2018-02-22 NOTE — Progress Notes (Signed)
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 CT scan of the chest showed no evidence of distant metastatic disease. I will have further discussion with the patient and family as an outpatient next week. Treatment would include doxorubicin with or without ifosfamide but I would prefer to discuss this in great detail when her husband is around  Hydronephrosis Complicated urinary tract infection Port placement is placed on hold I recommend IV antibiotics to treat the urinary tract infection I recommend urology consultation to consider stent placement for hydronephrosis  Subacute bowel obstruction Overall, she is improving and able to tolerate some clear diet I would defer to primary service to advance her diet as tolerated  If she gets discharged over the next few days, I will schedule outpatient follow-up for further discussion about chemotherapy  Anemia Thrombocytosis Likely related to recurrence of malignancy There is a component of iron deficiency I will schedule I dose of intravenous iron today  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Moderate protein calorie malnutrition She will continue oral diet as tolerated  Goals of care She is aware that her cancer is incurable but is interested to try palliative treatment if possible I will continue to follow If she is discharged over the weekend, I will schedule her outpatient follow-up early next week However, if she is not improving, we will consider transferring her to [facility] long hospital to start inpatient chemotherapy  [name], MD 02/22/2018  4:10 PM   Subjective:  She felt better today.  She was able to tolerate full  liquid diet.  She has no bowel movement today or yesterday.  Denies nausea.  Abdominal bloating might have improved somewhat.  She denies abdominal pain  Objective:  Vitals:   02/22/18 0442 02/22/18 0956  BP: 130/86 99/80  Pulse: 92 (!) 108  Resp: 16 18  Temp: 98.3 F (36.8 C) 98.1 F (36.7 C)  SpO2: 100% 100%     Intake/Output Summary (Last 24 hours) at 02/22/2018 1610 Last data filed at 02/22/2018 0900 Gross per 24 hour  Intake 3811.94 ml  Output 1050 ml  Net 2761.94 ml    GENERAL:alert, no distress and comfortable ABDOMEN:abdomen soft, mildly distended.  Nontender Musculoskeletal:no cyanosis of digits and no clubbing  NEURO: alert & oriented x 3 with fluent speech, no focal motor/sensory deficits   Labs:  Lab Results  Component Value Date   WBC 13.0 (H) 02/22/2018   HGB 9.3 (L) 02/22/2018   HCT 31.2 (L) 02/22/2018   MCV 83.2 02/22/2018   PLT 508 (H) 02/22/2018   NEUTROABS 11.1 (H) 02/22/2018    Lab Results  Component Value Date   NA 129 (L) 02/21/2018   K 4.3 02/21/2018   CL 99 02/21/2018   CO2 20 (L) 02/21/2018    Studies:  Dg Abd 1 View  Result Date: 02/21/2018 CLINICAL DATA:  Abdominal pain and distention for 2 days EXAM: ABDOMEN - 1 VIEW COMPARISON:  CT abdomen pelvis of 03/05/2018 FINDINGS: FINDINGS Prominent soft tissue throughout the left flank appears to be due to the CT demonstrated retroperitoneal tumor apparently sarcoma, much of the bowel is pushed to the right abdomen. No bowel obstruction is seen. Some contrast is noted within distal small bowel loops without distension.  Surgical clips are noted overlying the pelvis. Sclerotic lesions are present throughout the bones of the pelvis consistent with metastasis. IMPRESSION: 1. Paucity of bowel gas throughout the left flank most consistent with the CT demonstrated retroperitoneal soft tissue mass most consistent with sarcoma at by history. 2. No bowel obstruction. Some contrast node is noted within the small  bowel distally. 3. Blastic pelvic bone lesions suggestive of metastasis. Electronically Signed   By: Ivar Drape M.D.   On: 02/21/2018 10:57   Ct Chest W Contrast  Result Date: 02/22/2018 CLINICAL DATA:  Retroperitoneal soft tissue sarcoma, for staging EXAM: CT CHEST WITH CONTRAST TECHNIQUE: Multidetector CT imaging of the chest was performed during intravenous contrast administration. CONTRAST:  72mL OMNIPAQUE IOHEXOL 300 MG/ML  SOLN COMPARISON:  Partial comparison to CT abdomen/pelvis dated 02/08/2018 FINDINGS: Cardiovascular: Heart is normal in size.  No pericardial effusion. No evidence of thoracic aortic aneurysm. Mild atherosclerotic calcifications of the aortic arch. Coronary atherosclerosis of the LAD. Mediastinum/Nodes: No suspicious mediastinal, hilar, or axillary lymphadenopathy. Visualized thyroid is unremarkable. Lungs/Pleura: Mild linear scarring/atelectasis in the right upper lobe (series 4/image 45). Mild linear/branching scarring/atelectasis in the left upper lobe (series 4/image 61). No suspicious pulmonary nodules. No focal consolidation. No pleural effusion or pneumothorax. Upper Abdomen: Better evaluated on recent CT abdomen/pelvis, noting a dominant left retroperitoneal sarcoma and left hydronephrosis. Musculoskeletal: Visualized osseous structures are within normal limits. IMPRESSION: No evidence of metastatic disease in the chest. Left retroperitoneal sarcoma with left hydronephrosis, incompletely visualized and better evaluated on recent CT abdomen/pelvis. Aortic Atherosclerosis (ICD10-I70.0). Electronically Signed   By: Julian Hy M.D.   On: 02/22/2018 10:38   Ct Abdomen Pelvis W Contrast  Result Date: 03/04/2018 CLINICAL DATA:  62 year old female with a history of bloated sensation with diarrhea. Known left pelvis sarcoma, with the electronic records indicating treatment at [facility]. Biopsy was performed previously 06/01/2017. EXAM: CT ABDOMEN AND PELVIS WITH CONTRAST TECHNIQUE:  Multidetector CT imaging of the abdomen and pelvis was performed using the standard protocol following bolus administration of intravenous contrast. CONTRAST:  133mL OMNIPAQUE IOHEXOL 300 MG/ML  SOLN COMPARISON:  MR 05/31/2017, CT 06/02/2015 FINDINGS: Lower chest: Lower chest unremarkable. No pleural effusion or nodules of the lung bases. Unremarkable visualized cardiac structures. Unremarkable chest wall. Hepatobiliary: Unremarkable appearance of liver parenchyma. Unremarkable gallbladder. Pancreas: Unremarkable pancreas. Spleen: Unremarkable spleen Adrenals/Urinary Tract: Unremarkable adrenal glands. Right kidney demonstrates moderate hydronephrosis and dilation of the proximal right ureter. The right ureter is inseparable from soft tissue in the right anatomic pelvis, as the ureter crosses the iliac vasculature. Left kidney with moderate to large hydronephrosis. The left ureter is visualized proximally to be circumferentially involved with retroperitoneal tumor. There is loss of visualization of the left ureter distally. Tumor tissue involves the inferior aspect of the left kidney and the lateral aspect of the left kidney, with tissue extending superiorly to the inferior margin of the spleen. Stomach/Bowel: Unremarkable stomach. Tumor tissues inseparable from the posterior aspect of the stomach. Small bowel loops have been displaced towards the patient's right given the volume of tumor in the left retroperitoneum. Tissue of the rectum is inseparable from tumor tissue in the recto uterine space. The rectum/sigmoid rectal junction is displaced to the patient's right secondary to tumor tissue. The descending colon is up lifted and displaced, with the margins of sigmoid colon inseparable from tumor tissue in the abdomen/pelvis. The posterior aspects of the cecum is intimately associated with tumor tissue in the right anatomic pelvis. Unremarkable appendix. Proximal colon  is somewhat distended into the transverse  colon. The splenic flexure is not well visualized. Enteric contrast through the length of small bowel, with mild dilation of small bowel loops. Small bowel loops within the right low abdomen are inseparable from tumor tissue at the level of the sacrum/sacral base. Vascular/Lymphatic: Atherosclerotic changes of the abdominal aorta. The left iliac system is circumferential involved with tumor tissue including both the distal common iliac artery, hypogastric artery, and external iliac artery which is uplifted through its course in the pelvis. The common femoral artery is circumferential involved with tumor in the proximal thigh. The left iliac vein is not visualized. Tumor tissue encroaches on the distal aorta just above the bifurcation, at the level of the inferior mesenteric artery origin. Mesenteric arteries of the low abdomen are displaced secondary to tumor tissue at the sacral base. Soft tissue implants overlying the right-sided rectus femora S measures 15 mm. Tumor tissue is inseparable from the left-sided abdominal musculature below the umbilicus, with tumor tissue at the umbilicus (sister Wynona Dove node). Reproductive: Hysterectomy. Tumor tissue occupies the anatomic pelvis, inseparable from the posterior urinary bladder and the rectum within the recto uterine space. Other: Interval enlargement of tumor within the left retroperitoneum/extra peritoneum, with tumor now extending from the left abdominal musculature medially to the lumbar spine and aorta, up lifting the bowel and sigmoid colon. Tumor extends superior along the lateral kidney within the perinephric space and extra peritoneum to the spleen. Tumor extends inferiorly within the gluteal musculature and the anatomic pelvis into the proximal thigh. Musculoskeletal: Irregularity of the left iliac bone with rarefaction of the bone adjacent to the tumor. Osteopenia throughout the musculoskeletal system. No acute fracture identified. IMPRESSION: CT  demonstrates significant progression of known left pelvic sarcoma. The tumor demonstrates predominantly retroperitoneal and extraperitoneal growth, occupying the left retroperitoneum, with extension superiorly to the inferior spleen, lateral to the kidney, medially to the infrarenal aorta and lumbar spine, inferior medially into the anatomic pelvis, and inferior laterally into the proximal left thigh. Sequela include: -left moderate to large hydronephrosis -mild to moderate right hydronephrosis -compression of distal small bowel loops and rectosigmoid junction, resulting in at least partial small bowel obstruction/colonic obstruction -circumferential involvement of the left iliac arteries including common, internal iliac, external iliac, and common femoral artery -likely obstruction of the left iliac venous system -extraperitoneal spread along the bilateral abdominal wall musculature, including lymphatic involvement at the umbilicus (sister Wynona Dove node). These results were called by telephone at the time of interpretation on 02/25/2018 at 5:25 pm to Dr. Maree Erie [facility] , who verbally acknowledged these results. Electronically Signed   By: Corrie Mckusick D.O.   On: 02/16/2018 17:25

## 2018-02-23 ENCOUNTER — Encounter: Payer: Self-pay | Admitting: Hematology and Oncology

## 2018-02-23 DIAGNOSIS — M7989 Other specified soft tissue disorders: Secondary | ICD-10-CM

## 2018-02-23 DIAGNOSIS — G5792 Unspecified mononeuropathy of left lower limb: Secondary | ICD-10-CM

## 2018-02-23 DIAGNOSIS — R112 Nausea with vomiting, unspecified: Secondary | ICD-10-CM

## 2018-02-23 DIAGNOSIS — C499 Malignant neoplasm of connective and soft tissue, unspecified: Secondary | ICD-10-CM

## 2018-02-23 MED ORDER — BACLOFEN 10 MG PO TABS
5.0000 mg | ORAL_TABLET | Freq: Three times a day (TID) | ORAL | Status: DC | PRN
  Administered 2018-02-23: 5 mg via ORAL
  Filled 2018-02-23 (×2): qty 1

## 2018-02-23 MED ORDER — FLEET ENEMA 7-19 GM/118ML RE ENEM
1.0000 | ENEMA | Freq: Once | RECTAL | Status: AC
  Administered 2018-02-23: 1 via RECTAL
  Filled 2018-02-23: qty 1

## 2018-02-23 NOTE — Progress Notes (Signed)
Pt tolerating full liquids.  Pos flatus but no BM's.  Would cont full liquid diet for now.  Nutrition consult for education for home FLD regimen.  Will see PRN going forwards.  Please call us with any questions or concerns.  Rosario Adie, MD  [facility]

## 2018-02-23 NOTE — Progress Notes (Signed)
Family Medicine Teaching Service Daily Progress Note Intern Pager: [phone]  Patient name: Anita Whitehead Medical record number: [id number] Date of birth: 09-15-1955 Age: 62 y.o. Gender: female  Primary Care Provider: [name], NP Consultants: Surgery, Oncology Code Status: FULL  Pt Overview and Major Events to Date:  7/17- admit  Assessment and Plan: Anita Whitehead is a 62 year old female presenting with abdominal bloating and 2/2 small bowel obstruction. PMH is significant for left-sided retroperitoneal sarcoma, h/o cervical cancer 20 yr ago, HTN, HLD.    Partialsmall bowel obstruction: 2/2 pelvic Sarcoma. Patient passing gas and is without vomiting. Patient has had no bm as of yet and as such will follow surgery recs to continue full diet. Condition improving. 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 Followed byDr. Alvy Bimler and [place] following, appreciate recommendations, will need port for palliative chemotherapy - Lovenox for DVT prophylaxis due to hypercoagulable state from malignancy - Fentanyl PRN for pain control  Asymptomatic Bacteruria: patient grew Klebsiella on U.Cx (7/18), received Rocephin x1 for E. Coli in Urine in ED. - will start Bactrim as patient to have port placement for palliative chemotherapy. Patient will be adequately treated with 3 days of oral therapy - consider repeat UA prior to port placement  Tachycardia: likely 2/2 dehydration, poor intake, and pain.  - continue to monitor on telemetry - EKG 7/18: normal sinus with nonspecific t-wave abnormality, unchanged  Hyponatremia:Na 129 on presentation, 2/2 Dehydration for poor PO.No confusion, AMS.  -D5 NS 100 cc/h  AKI, improving: Cr 1.23 >  1.01 on 7/19. Bilateral hydronephrosis, L > R shown on CT on 7/17. - Continue fluid resuscitation,D5NS100 cc/h - Trend BMP / urinary output - Avoid nephrotoxic agents, ensure adequate renal perfusion   Lactic Acidosis, resolved:1.91 > 2.2 > 1.2 nml(7/18) - Fluid resuscitation, D5 normal saline 125 cc/h - Repeat LA?  LeukocytosisWBC 12.8, up from 11,4 months ago. Likely due to malignancy. - Follow-up CBC in a.m.  Anemia:hemoglobin 10.9 in the ED, 9.3 on 7/20. Denies chest pain, shortness of breath. - Follow-up CBC in a.m. - s/p IV iron  Thrombocytosis, improved:platelets 667 in the ED, 508 on 7/20 - Follow-up outpatient as needed - Lovenox for DVT prophylaxis  Left Leg Weakness:Chronic.Patient has 3 out of 5 strength in the left lower extremity following tumor resection in April. She notes that part of the tumor was attached to her femoral nerve. She has been working with physical therapy due to weakness. She uses a walker for ambulation. She denies any new weakness. - PT outpatient  HTN: Chronic, stable.Normotensive overnight. Coreg at home. - Continue home Coreg  Hx Cervical Cancer, S/P TAH30 years ago. Patient states that she received radiation therapy prior to surgery.  FEN/GI:Full liquid diet Prophylaxis:Lovenox  Disposition:stable for home if cleared by surgery  Subjective:  Patient with back pain and cramping overnight, requesting K pad and muscle relaxer. Patient very eager to go home  Objective: Vitals:   02/22/18 2105 02/23/18 0522  BP: 121/81 139/87  Pulse: (!) 110 100  Resp: 16 18  Temp: 98.7 F (37.1 C) 98.1 F (36.7 C)  SpO2: 100% 99%   Physical Exam: General: NAD, pleasant Eyes: PERRL, EOMI, no conjunctival pallor or injection ENTM: Moist mucous membranes, no pharyngeal erythema or exudate Neck: Supple, no LAD Cardiovascular: RRR,  no m/r/g, no LE edema Respiratory: CTA BL, normal work of  breathing Gastrointestinal: hard LLQ with midline scar, nontender, nondistended, normoactive BS MSK: moves 4 extremities equally Derm: no rashes appreciated Neuro: CN II-XII grossly intact Psych: AOx3, appropriate affect  Laboratory: Vitals:   02/22/18 2105 02/23/18 0522  BP: 121/81 139/87  Pulse: (!) 110 100  Resp: 16 18  Temp: 98.7 F (37.1 C) 98.1 F (36.7 C)  SpO2: 100% 99%   CMP Latest Ref Rng & Units 02/21/2018 02/21/2018 10/19/2017  Glucose 70 - 99 mg/dL 94 98 121  BUN 6 - 20 mg/dL _0 Creatinine 0.44 - 1.00 mg/dL 1.01(H) 1.23(H) 0.97  Sodium 135 - 145 mmol/L 129(L) 129(L) 135(L)  Potassium 3.5 - 5.1 mmol/L 4.3 3.8 4.1  Chloride 98 - 111 mmol/L 99 92(L) 99  CO2 22 - 32 mmol/L 20(L) 24 26  Calcium 8.9 - 10.3 mg/dL 8.3(L) 8.9 10.4  Total Protein 6.5 - 8.1 g/dL - 6.7 8.0  Total Bilirubin 0.3 - 1.2 mg/dL - 0.4 0.3  Alkaline Phos 38 - 126 U/L - 70 80  AST 15 - 41 U/L - 16 14  ALT 0 - 44 U/L - 10 10   Urine Cx (7/17): 100,000+ colonies Klebsiella Pneumoniae Lipase (7/17): 22 nml Lactic Acid: 1.91 > 2.2 > 1.2 > 1.2 (7/18) Urinalysis (7/17): cloudy with small hgb, large leukocytes and many bacteria ESR (7/18): 50 H Iron (7/18): 12 L TIBC (7/18): 167 L Ferritin (7/18): 257 N CBC w/ Diff (7/18):  Ref Range & Units 7/19 7/18 7/21 November 2017  WBC 4.0 - 10.5 K/uL 13.0High   14.2High   12.8High   11.0High  R  RBC 3.87 - 5.11 MIL/uL 3.75Low   3.81Low   4.36  4.27 R  Hemoglobin 12.0 - 15.0 g/dL 9.3Low   9.7Low   10.9Low   12.5 R  HCT 36.0 - 46.0 % 31.2Low   29.8Low   33.8Low   37.8 R  MCV 78.0 - 100.0 fL 83.2  78.2  77.5Low   88.5 R  MCH 26.0 - 34.0 pg 24.8Low   25.5Low   25.0Low   29.3 R  MCHC 30.0 - 36.0 g/dL 29.8Low   32.6  32.2  33.1 R  RDW 11.5 - 15.5 % 20.2High   19.1High   18.9High   13.4 R  Platelets 150 - 400 K/uL 508High   601High  CM 667High  CM 322 R  Neutrophils Relative % % 85    85   Neutro Abs 1.7 - 7.7 K/uL 11.1High     9.5High  R  Lymphocytes Relative  % 6    7   Lymphs Abs 0.7 - 4.0 K/uL 0.8    0.7Low  R  Monocytes Relative % 7    7   Monocytes Absolute 0.1 - 1.0 K/uL 1.0    0.7 R  Eosinophils Relative % 1    1   Eosinophils Absolute 0.0 - 0.7 K/uL 0.1    0.1 R  Basophils Relative % 0    0   Basophils Absolute 0.0 - 0.1 K/uL 0.0    0.0 CM   Imaging/Diagnostic Tests: No results found. ECHO: Study Conclusions  - Left ventricle: The cavity size was normal. There was mild   concentric hypertrophy. Systolic function was vigorous. The   estimated ejection fraction was in the range of 65% to 70%. Wall   motion was normal; there were no regional wall motion   abnormalities.  Doppler parameters are consistent with abnormal   left ventricular relaxation (grade 1 diastolic dysfunction).   There was no evidence of elevated ventricular filling pressure by   Doppler parameters. - Aortic valve: There was no regurgitation. - Left atrium: The atrium was normal in size. - Right ventricle: Systolic function was normal. - Right atrium: The atrium was normal in size. - Tricuspid valve: There was no regurgitation. - Pulmonic valve: There was no regurgitation. - Pulmonary arteries: Systolic pressure could not be accurately   estimated. - Inferior vena cava: The vessel was normal in size. - Pericardium, extracardiac: There was no pericardial effusion.   [place] Zhanna Melin, DO PGY-2, [facility]  02/23/2018 7:36 AM FPTS Intern pager: [phone], text pages welcome

## 2018-02-23 NOTE — Progress Notes (Signed)
Family Medicine Teaching Service Daily Progress Note Intern Pager: [phone]  Patient name: Anita Whitehead Medical record number: [id number] Date of birth: 11/20/1955 Age: 62 y.o. Gender: female  Primary Care Provider: [name], NP Consultants:Surgery, Oncology Code Status:FULL  Pt Overview and Major Events to Date: 7/17 - admit  Assessment and Plan: Anita Whitehead is a 62 year old female presenting with abdominal bloatingand 2/2 small bowel obstruction. PMH is significant for left-sided retroperitoneal sarcoma.  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 Patient passing gas and is without vomiting.Condition improving. 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 Followed byDr. Alvy Bimler and [facility] consulted in ED. - Follow-up oncology recommendations in the a.m. - Lovenox for DVT prophylaxis due to hypercoagulable state from malignancy - Fentanyl PRN for pain control  Asymptomatic Bacteruria: patient grew Klebsiella on U.Cx (7/18), received Rocephin x1 for E. Coli in Urine in ED. - patient without frequency, urgency, dysuria - no treatment at this time  Tachycardia: likely 2/2 dehydration, poor intake, and pain.  - continue to monitor on telemetry - EKG7/18: normal sinus with nonspecific t-wave abnormality, unchanged  Hyponatremia:Na129 on presentation, 2/2 Dehydration for poor PO.No confusion, AMS -D5 normal saline 125 cc/h -Follow-up BMP7/18: Na unchanged  AKI,  improving:Cr 1.23> 1.01. On 6/21 0.74, yesterday was 1.1.Bilateral hydronephrosis,L > Rshown on CT on 7/17. - Continue fluid resuscitation,D5NS125 cc/h - Follow-up BMP7/18: improved Cr. - Consider nephrology consultation as needed -Avoid nephrotoxic agents, ensure adequate renal perfusion  Lactic Acidosis, resolved:1.91> 2.2 > 1.2 nml(7/18) - Fluid resuscitation, D5 normal saline 125 cc/h - Repeat LA?  LeukocytosisWBC 12.8, up from 11,4 months ago. Likely due to malignancy versus UTI. - Follow-up CBC in a.m.  Anemia:hemoglobin 10.9 in the ED, down from 12.5,4 months ago. Denies melena, hematochezia. Denies chest pain, shortness of breath. - Follow-up CBC in a.m.  Thrombocytosis:platelets 667 in the ED, up from 322 4 months ago. - Follow-up CBC in a.m. - Lovenox for DVT prophylaxis  Left Leg Weakness:Chronic.Patient has 3 out of 5 strength in the left lower extremity following tumor resection in April. She notes that part of the tumor was attached to her femoral nerve. She has been working with physical therapy due to weakness. She uses a walker for ambulation. She denies any new weakness. - PT/OT  HTN: Chronic, stable.118/88 on presentation to ED. Has remained normotensive.Coreg at home. - Continue home Coreg  Hx Cervical Cancer, S/P TAH30 years ago.Patient states that she received radiation therapy prior to surgery.  FEN/GI:Full liquid diet, suppository for constipation Prophylaxis:Lovenox  Disposition:Admit to Telemetry  Subjective: Patient seen this morning reading in bed in NAD. She reports feeling well this morning and that she is passing gas, but no bowel movement. She is consuming full liquids today but has not started them yet. She denies headaches, fevers, chest pain, shortness of breath, diarrhea, dysuria, frequency, and weakness. She is aware of the growing sarcoma but remains a positive, upbeat person. She says she is  aware of the plan for chemo and the needed steps to make that happen.  Objective: Temp:  [97.8 F (36.6  C)-98.7 F (37.1 C)] 98.1 F (36.7 C) (07/20 0522) Pulse Rate:  [100-110] 100 (07/20 0522) Resp:  [16-18] 18 (07/20 0522) BP: (99-139)/(69-87) 139/87 (07/20 0522) SpO2:  [99 %-100 %] 99 % (07/20 0522)  Physical Exam: Constitutional: She isoriented to person, place, and time. She appearswell-developed.Non-toxic appearance. Sheappears ill(frail).  Head:Normocephalicand atraumatic.  Eyes:EOMare normal. No scleral icterus.  Cardiovascular:Normal rate.  Pulmonary/Chest:Effort normaland breath sounds normal.  Abdominal:Bowel sounds are normal. There is nohepatosplenomegaly. There isno tenderness. Nohernia. Firm mass palpable in left abdomen, esp LLQ. Vertical abdominal scar from previous surgeries, bowel sounds in right abdomen and RUQ, none auscultated in LLQ due to mass. Neurological: She isalertand oriented to person, place, and time.  Skin: Skin iswarmand dry. Capillary refill takesless than 2 seconds.  Psychiatric: She has anormal mood and affect. Herbehavior is normal  Laboratory: Recent Labs  Lab 03/05/2018 1256 02/21/18 0235 02/22/18 0625  WBC 12.8* 14.2* 13.0*  HGB 10.9* 9.7* 9.3*  HCT 33.8* 29.8* 31.2*  PLT 667* 601* 508*   Recent Labs  Lab 03/02/2018 1256 02/21/18 0235  NA 129* 129*  K 3.8 4.3  CL 92* 99  CO2 24 20*  BUN 20 14  CREATININE 1.23* 1.01*  CALCIUM 8.9 8.3*  PROT 6.7  --   BILITOT 0.4  --   ALKPHOS 70  --   ALT 10  --   AST 16  --   GLUCOSE 98 94   Urine Cx (7/17): 100,000+ colonies Klebsiella Pneumoniae Lipase (7/17): 22 nml Lactic Acid: 1.91 > 2.2 > 1.2 > 1.2 (7/18) Urinalysis(7/17): cloudy with small hgb, large leukocytes and many bacteria ESR (7/18): 50 H Iron (7/18): 12 L TIBC (7/18): 167 L Ferritin (7/18): 257 N  Imaging/Diagnostic Tests: Dg Abd 1 View  Result Date: 02/21/2018 CLINICAL DATA:  Abdominal  pain and distention for 2 days EXAM: ABDOMEN - 1 VIEW COMPARISON:  CT abdomen pelvis of 02/07/2018 FINDINGS: FINDINGS Prominent soft tissue throughout the left flank appears to be due to the CT demonstrated retroperitoneal tumor apparently sarcoma, much of the bowel is pushed to the right abdomen. No bowel obstruction is seen. Some contrast is noted within distal small bowel loops without distension. Surgical clips are noted overlying the pelvis. Sclerotic lesions are present throughout the bones of the pelvis consistent with metastasis. IMPRESSION: 1. Paucity of bowel gas throughout the left flank most consistent with the CT demonstrated retroperitoneal soft tissue mass most consistent with sarcoma at by history. 2. No bowel obstruction. Some contrast node is noted within the small bowel distally. 3. Blastic pelvic bone lesions suggestive of metastasis. Electronically Signed   By: Ivar Drape M.D.   On: 02/21/2018 10:57   Ct Abdomen Pelvis W Contrast: 02/07/2018 IMPRESSION: CT demonstrates significant progression of known left pelvic sarcoma. The tumor demonstrates predominantly retroperitoneal and extraperitoneal growth, occupying the left retroperitoneum, with extension superiorly to the inferior spleen, lateral to the kidney, medially to the infrarenal aorta and lumbar spine, inferior medially into the anatomic pelvis, and inferior laterally into the proximal left thigh. Sequela include: -left moderate to large hydronephrosis -mild to moderate right hydronephrosis -compression of distal small bowel loops and rectosigmoid junction, resulting in at least partial small bowel obstruction/colonic obstruction -circumferential involvement of the left iliac arteries including common, internal iliac, external iliac, and common femoral artery -likely obstruction of the left iliac venous system -extraperitoneal spread along the bilateral abdominal wall musculature, including lymphatic involvement at the umbilicus  (sister Wynona Dove node). These results were called  by telephone at the time of interpretation on 03/04/2018 at 5:25 pm to Dr. Maree Erie [facility] , who verbally acknowledged these results.  Daisy Floro, DO 02/23/2018, 6:57 AM PGY-1, [place] Intern pager: [phone], text pages welcome

## 2018-02-23 NOTE — Progress Notes (Signed)
Paged by nurse as patient experiencing anxiety.  Patient seen and examined at bedside.  Patient stated that she was more bothered by back spasms than what she would refer to as "anxiety."  Denies CP/SOB.  She is requesting to not increase pain meds at this time.   - Order K pad to apply to lower back - patient encouraged to let her nurse know if she continues to experience uncontrolled pain  Gregery Na, DO PGY-1 Family Medicine 3:49 AM

## 2018-02-23 NOTE — Progress Notes (Signed)
Patient is going to be transferred to [facility] for a procedure.  She will be going to [place].  Gave report to [place] nurse at [place] and answered all her questions.  Called Care link to pick the patient.

## 2018-02-23 NOTE — Discharge Summary (Signed)
[facility] Transfer Summary  Patient name: Anita Whitehead Medical record number: [id number] Date of birth: 12/11/1955 Age: 62 y.o. Gender: female Date of Admission: 02/17/2018  Date of Discharge: 7/20 Admitting Physician: Blane Ohara McDiarmid, MD  Primary Care Provider: [name], NP Consultants: Surgery, Hematology, Urology  Indication for Hospitalization: Small Bowel Obstruction  Discharge Diagnoses/Problem List:  Partial Small bowel obstruction Retroperitoneal Sarcoma Asymptomatic Bacteruria Hyponatremia Anemia HTN Left leg weakness  Disposition: discharge to [facility] for surgery  Discharge Condition: stable  Discharge Exam:  Constitutional: She isoriented to person, place, and time. She appearswell-developed.Non-toxic appearance. Sheappears ill(frail).  Head:Normocephalicand atraumatic.  Eyes:EOMare normal. No scleral icterus.  Cardiovascular:Normal rate.  Pulmonary/Chest:Effort normaland breath sounds normal.  Abdominal:Bowel sounds are normal. There is nohepatosplenomegaly. There isno tenderness. Nohernia.Firm mass palpable in left abdomen, esp LLQ. Vertical abdominal scar from previous surgeries, bowel sounds in right abdomen and RUQ, none auscultated in LLQ due to mass. MSK: swollen left thigh > right 2/2 sarcoma. Otherwise full ROM and grossly intact without deformity. Neurological: She isalertand oriented to person, place, and time.  Skin: Skin iswarmand dry. Capillary refill takesless than 2 seconds.  Psychiatric: She has anormal mood and affect. Herbehavior is normal  Brief Hospital Course:  Anita Whitehead is a 62 year old female with pelvic and retroperitoneal sarcoma who was admitted for abdominal pain and nausea. KUB showed partial small bowel obstruction secondary to the tumor. General surgery was consulted for SBO management. The patient's bowel obstruction slowly improved and the patient's diet was  advanced to full liquid. Hematology was consulted for management of the Sarcoma and suggested that port placement and palliative chemotherapy were the next steps. The KUB also showed bilateral hydronephrosis and urology was consulted. Urine Culture grew klebsiella pneumoniae. Although the patient had no urinary symptoms, the patient was treated with Bactrim for UTI due to the hydronephrosis. Urology decided the patient needed stents and made the decision to transfer the patient to [facility] for the procedure.   Issues for Follow Up:  1. Follow up with your Urologist for treatment of hydronephrosis and procedures for ureter stents. 2. Continue Bactrim for treatment of asymptomatic Klebsiella pneumoniae bacteruria. 3. Maintain close follow up with Dr. Alvy Bimler for port placement, initiation of chemotherapy, and further management of sarcoma. 4. Continue a full liquid diet until you have had a bowel movement.  Significant Procedures: None  Significant Labs and Imaging:  Recent Labs  Lab 02/16/2018 1256 02/21/18 0235 02/22/18 0625  WBC 12.8* 14.2* 13.0*  HGB 10.9* 9.7* 9.3*  HCT 33.8* 29.8* 31.2*  PLT 667* 601* 508*   Recent Labs  Lab 02/19/2018 1256 02/21/18 0235  NA 129* 129*  K 3.8 4.3  CL 92* 99  CO2 24 20*  GLUCOSE 98 94  BUN 20 14  CREATININE 1.23* 1.01*  CALCIUM 8.9 8.3*  ALKPHOS 70  --   AST 16  --   ALT 10  --   ALBUMIN 2.8*  --    Urine Cx (7/17): 100,000+ colonies Klebsiella Pneumoniae Lipase (7/17): 22 nml Lactic Acid: 1.91 > 2.2 > 1.2 > 1.2 (7/18) Urinalysis(7/17): cloudy with small hgb, large leukocytes and many bacteria ESR (7/18):50 H Iron (7/18):12 L TIBC (7/18):167 L Ferritin (7/18):257 N  Results/Tests Pending at Time of Discharge: None  Discharge Medications:   Current Facility-Administered Medications:  .  baclofen (LIORESAL) tablet 5 mg, 5 mg, Oral, TID PRN, En[place] Derry, [place], DO, 5 mg at 02/23/18 1049 .  carvedilol (COREG) tablet  12.5 mg, 12.5  mg, Oral, BID, Shirley, [place], DO, 12.5 mg at 02/23/18 0934 .  dextrose 5 %-0.9 % sodium chloride infusion, , Intravenous, Continuous, Enid Derry, [place], DO, Last Rate: 100 mL/hr at 02/23/18 1422 .  enoxaparin (LOVENOX) injection 30 mg, 30 mg, Subcutaneous, Q24H, Shirley, [place], DO, 30 mg at 02/23/18 0934 .  feeding supplement (BOOST / RESOURCE BREEZE) liquid 1 Container, 1 Container, Oral, TID BM, Greer Pickerel, MD, 1 Container at 02/23/18 [phone] .  fentaNYL (SUBLIMAZE) injection 12.5 mcg, 12.5 mcg, Intravenous, Q2H PRN, Enid Derry, [place], DO, 12.5 mcg at 02/23/18 1419 .  HYDROcodone-acetaminophen (NORCO) 10-325 MG per tablet 1 tablet, 1 tablet, Oral, Q4H PRN, Shirley, [place], DO, 1 tablet at 02/23/18 1315 .  ondansetron (ZOFRAN) tablet 4 mg, 4 mg, Oral, Q6H PRN, 4 mg at 02/22/18 2237 **OR** ondansetron (ZOFRAN) injection 4 mg, 4 mg, Intravenous, Q6H PRN, Enid Derry, [place], DO .  sulfamethoxazole-trimethoprim (BACTRIM,SEPTRA) 400-80 MG per tablet 1 tablet, 1 tablet, Oral, Q12H, Daisy Floro, DO, 1 tablet at 02/23/18 2820  Discharge Instructions: Please refer to Patient Instructions section of EMR for full details.  Patient was counseled important signs and symptoms that should prompt return to medical care, changes in medications, dietary instructions, activity restrictions, and follow up appointments.   Follow-Up Appointments: Follow-up Information    [name], NP. Schedule an appointment as soon as possible for a visit.   Specialty:  Family Medicine Why:  hospital fup  Contact information: [place] [address] [phone]        [name], MD. Schedule an appointment as soon as possible for a visit.   Specialty:  Hematology and Oncology Contact information: [place] [phone] [place], [place], DO 02/23/2018, 3:00 PM PGY-1, [place]

## 2018-02-23 NOTE — Consult Note (Signed)
H&P Physician requesting consult: [name], MD  Chief Complaint: Bilateral hydronephrosis secondary to large intra-abdominal mass/sarcoma  History of Present Illness: 62 year old female with history of retroperitoneal sarcoma.  This is been resected in the past.  At that point in time, she required a left ureteral reimplant with Boari flap.  This was an extensive resection.  Patient is also undergone radiation therapy.  She is currently admitted for possible small bowel obstruction.  CT scan revealed recurrence with large intra-abdominal mass consistent with recurrent sarcoma.  This encased the entire left ureter causing severe hydronephrosis on the left.  She also had right-sided hydronephrosis.  The right ureter was inseparable from the soft tissue in the right pelvis.  The patient currently has minimal complaints.  No voiding complaints.  Denies bilateral flank pain currently.  Creatinine on 02/21/2018 was 1.01.  She is likely going to undergo chemotherapy next week.  Therefore, I was consulted to consider ureteral stent placement given the bilateral hydronephrosis to preserve renal function throughout chemotherapy.  Past Medical History:  Diagnosis Date  . ALLERGIC RHINITIS 05/14/2007   Qualifier: Diagnosis of  By: Rogue Bussing CMA, Maryann Alar    . Allergy   . Cervical cancer ([place]) 1989  . History of colon polyps    TA   . Hyperlipidemia   . Hypertension   . Pelvic mass in female 05/31/2017  . PMS (premenstrual syndrome)    Past Surgical History:  Procedure Laterality Date  . ABDOMINAL HYSTERECTOMY    . adenomatous colon polyps    . COLONOSCOPY     04-01-2009,2006 with [facility]   . POLYPECTOMY    . RESECTION OF ABDOMINAL MASS N/A 11/13/2017   Surgery at [facility]. 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 URETERAL REIMPLANTION Left 4/919    Surgeon Tresa Endo at [phone]. Left ureteral reimplantation with Boari flap; 2. Left 8 x 26 cm ureteral stent placement; 3. Cystotomy closure    Home Medications:  Medications Prior to Admission  Medication Sig Dispense Refill Last Dose  . carvedilol (COREG) 12.5 MG tablet Take 12.5 mg by mouth 2 (two) times daily.  2 03/02/2018 at 0600  . cyclobenzaprine (FLEXERIL) 10 MG tablet Take 1 tablet (10 mg total) by mouth 3 (three) times daily as needed for muscle spasms. 60 tablet 3 03/03/2018 at prn  . docusate sodium (COLACE) 100 MG capsule Take 2 capsules (200 mg total) by mouth 2 (two) times daily. (Patient taking differently: Take 200 mg by mouth daily as needed. ) 60 capsule 3 02/16/2018 at prn  . feeding supplement, ENSURE ENLIVE, (ENSURE ENLIVE) LIQD Take 237 mLs by mouth 2 (two) times daily between meals. 237 mL 12 02/18/2018  . ferrous sulfate 325 (65 FE) MG EC tablet Take 325 mg by mouth daily.  3 02/22/2018 at Unknown time  . gabapentin (NEURONTIN) 300 MG capsule Take 1 capsule (300 mg total) by mouth 3 (three) times daily. (Patient taking differently: Take 300 mg by mouth at bedtime. ) 90 capsule 1 02/19/2018 at Unknown time  . HYDROcodone-acetaminophen (NORCO) 10-325 MG tablet Take 1 tablet by mouth every 6 (six) hours as needed. 90 tablet 0 02/23/2018 at prn  . megestrol (MEGACE ES) 625 MG/5ML suspension Take 5 mLs by mouth daily.  3 02/19/2018 at Unknown time  . morphine (MS CONTIN) 15 MG 12 hr tablet Take 1 tablet (  15 mg total) by mouth every 12 (twelve) hours. (Patient taking differently: Take 15 mg by mouth as needed. ) 60 tablet 0 Past Week at Unknown time  . ondansetron (ZOFRAN) 8 MG tablet Take 1 tablet (8 mg total) by mouth every 8 (eight) hours as needed for nausea. 30 tablet 3 02/17/2018 at prn  . prochlorperazine (COMPAZINE) 10 MG tablet Take 1 tablet (10 mg total) by mouth every 6 (six) hours as needed for nausea or vomiting. 30 tablet 1 Past Week at prn  . senna (SENOKOT) 8.6 MG TABS  tablet Take 2 tablets (17.2 mg total) by mouth 2 (two) times daily. 120 each 9 Past Week at Unknown time  . Vitamin D, Ergocalciferol, (DRISDOL) 50000 units CAPS capsule Take 50,000 Units by mouth once a week.   02/19/2018  . simvastatin (ZOCOR) 10 MG tablet Take 1 tablet (10 mg total) by mouth at bedtime. (Patient not taking: Reported on 02/12/2018) 30 tablet 1 Not Taking at Unknown time   Allergies: No Known Allergies  Family History  Problem Relation Age of Onset  . Diabetes Brother   . Seizures Brother   . Hypertension Mother   . Ovarian cancer Mother 22  . Hypertension Sister   . Breast cancer Sister 81  . Lung cancer Father 20  . Hypertension Sister   . Colon cancer Neg Hx   . Colon polyps Neg Hx   . Esophageal cancer Neg Hx   . Rectal cancer Neg Hx   . Stomach cancer Neg Hx    Social History:  reports that she quit smoking about 3 years ago. She has never used smokeless tobacco. She reports that she drank alcohol. She reports that she does not use drugs.  ROS: A complete review of systems was performed.  All systems are negative except for pertinent findings as noted. ROS   Physical Exam:  Vital signs in last 24 hours: Temp:  [97.8 F (36.6 C)-98.7 F (37.1 C)] 98.5 F (36.9 C) (07/20 1049) Pulse Rate:  [100-110] 107 (07/20 1049) Resp:  [16-18] 18 (07/20 1049) BP: (102-139)/(69-87) 126/80 (07/20 1049) SpO2:  [99 %-100 %] 100 % (07/20 1049) General:  Alert and oriented, No acute distress HEENT: Normocephalic, atraumatic Neck: No JVD or lymphadenopathy Cardiovascular: Regular rate and rhythm Lungs: Regular rate and effort Abdomen: Soft, nontender, nondistended Back: No CVA tenderness Extremities: No edema Neurologic: Grossly intact  Laboratory Data:  No results found for this or any previous visit (from the past 24 hour(s)). Recent Results (from the past 240 hour(s))  Urine culture     Status: Abnormal   Collection Time: 02/10/2018  1:56 PM  Result Value Ref  Range Status   Specimen Description URINE, RANDOM  Final   Special Requests   Final    NONE Performed at [facility], [address] [place], [place] [address]    Culture >=100,000 COLONIES/mL KLEBSIELLA PNEUMONIAE (A)  Final   Report Status 02/22/2018 FINAL  Final   Organism ID, Bacteria KLEBSIELLA PNEUMONIAE (A)  Final      Susceptibility   Klebsiella pneumoniae - MIC*    AMPICILLIN RESISTANT Resistant     CEFAZOLIN <=4 SENSITIVE Sensitive     CEFTRIAXONE <=1 SENSITIVE Sensitive     CIPROFLOXACIN <=0.25 SENSITIVE Sensitive     GENTAMICIN <=1 SENSITIVE Sensitive     IMIPENEM <=0.25 SENSITIVE Sensitive     NITROFURANTOIN <=16 SENSITIVE Sensitive     TRIMETH/SULFA <=20 SENSITIVE Sensitive     AMPICILLIN/SULBACTAM  4 SENSITIVE Sensitive     PIP/TAZO <=4 SENSITIVE Sensitive     Extended ESBL NEGATIVE Sensitive     * >=100,000 COLONIES/mL KLEBSIELLA PNEUMONIAE   Creatinine: Recent Labs    02/19/2018 1256 02/21/18 0235  CREATININE 1.23* 1.01*   CT scan personally reviewed of the abdomen and pelvis with contrast with pertinent results listed in the history of present illness.  Impression/Assessment:  Bilateral hydronephrosis secondary to extrinsic compression from retroperitoneal sarcoma  Plan:  I discussed the options with the patient which include observation, bilateral ureteral stent placement, bilateral nephrostomy tube placement.  I am hopeful that we will be able to place a right ureteral stent to relieve the obstruction on this side.  The left side however will be more challenging considering her history of Boari flap and the fact that the mass is encasing the majority of the ureter.  Even if I am able to get a ureteral stent up, there is a good chance that the stent will fail to relieve the obstruction due to the extensive extrinsic compression.  Nonetheless, she would like for me to try to put a stent on both sides.  Nephrostomy tube will be an option if we are unable to  place a stent.  I will discuss this with the primary team and request that she be transferred to [facility] where we can perform a bilateral ureteral stent placement tomorrow and possibly start chemotherapy inpatient next week.  Marton Redwood, III 02/23/2018, 1:45 PM

## 2018-02-23 NOTE — Progress Notes (Signed)
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 CT scan of the chest showed no evidence of distant metastatic disease. I will have further discussion with the patient and family as an outpatient next week. Treatment would include doxorubicin with or without ifosfamide but I would prefer to discuss this in great detail when her husband is around  Hydronephrosis Complicated urinary tract infection Port placement is placed on hold I recommend IV antibiotics to treat the urinary tract infection I recommend urology consultation to consider stent placement for hydronephrosis; due to concern for ifosfamide induced toxicity, I spoke with Dr. Gloriann Loan who is on call.  He will come to evaluate the patient to see if internal stenting is indicated to preserve her kidney function  Subacute bowel obstruction Overall, she is improving and able to tolerate some clear diet I would defer to primary service to advance her diet as tolerated  If she gets discharged over the next few days, I will schedule outpatient follow-up for further discussion about chemotherapy She has no bowel movement since admission.  I recommend Fleet enema  Anemia Thrombocytosis Likely related to recurrence of malignancy There is a component of iron deficiency She has received 1 dose of intravenous iron yesterday  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Moderate protein calorie malnutrition She will continue oral diet as tolerated  Goals of care She is aware that her cancer is incurable but is interested to try palliative treatment if possible I will continue to follow If she is discharged over the weekend, I will schedule  her outpatient follow-up early next week However, if she is not improving, we will consider transferring her to [facility] to start inpatient chemotherapy  [name], MD 02/23/2018  9:41 AM   Subjective:  She ate well yesterday.  Denies nausea.  She had no bowel movement since admission.  She denies significant pain  Objective:  Vitals:   02/22/18 2105 02/23/18 0522  BP: 121/81 139/87  Pulse: (!) 110 100  Resp: 16 18  Temp: 98.7 F (37.1 C) 98.1 F (36.7 C)  SpO2: 100% 99%     Intake/Output Summary (Last 24 hours) at 02/23/2018 0941 Last data filed at 02/23/2018 0700 Gross per 24 hour  Intake 1750.45 ml  Output -  Net 1750.45 ml    GENERAL:alert, no distress and comfortable Musculoskeletal:no cyanosis of digits and no clubbing  NEURO: alert & oriented x 3 with fluent speech, no focal motor/sensory deficits   Labs:  Lab Results  Component Value Date   WBC 13.0 (H) 02/22/2018   HGB 9.3 (L) 02/22/2018   HCT 31.2 (L) 02/22/2018   MCV 83.2 02/22/2018   PLT 508 (H) 02/22/2018   NEUTROABS 11.1 (H) 02/22/2018    Lab Results  Component Value Date   NA 129 (L) 02/21/2018   K 4.3 02/21/2018   CL 99 02/21/2018   CO2 20 (L) 02/21/2018    Studies:  Ct Chest W Contrast  Result Date: 02/22/2018 CLINICAL DATA:  Retroperitoneal soft tissue sarcoma, for staging EXAM: CT CHEST WITH CONTRAST TECHNIQUE: Multidetector CT imaging of the chest was performed during intravenous contrast administration. CONTRAST:  9mL OMNIPAQUE IOHEXOL 300 MG/ML  SOLN COMPARISON:  Partial comparison to CT abdomen/pelvis dated 02/16/2018  FINDINGS: Cardiovascular: Heart is normal in size.  No pericardial effusion. No evidence of thoracic aortic aneurysm. Mild atherosclerotic calcifications of the aortic arch. Coronary atherosclerosis of the LAD. Mediastinum/Nodes: No suspicious mediastinal, hilar, or axillary lymphadenopathy. Visualized thyroid is unremarkable. Lungs/Pleura: Mild linear  scarring/atelectasis in the right upper lobe (series 4/image 45). Mild linear/branching scarring/atelectasis in the left upper lobe (series 4/image 61). No suspicious pulmonary nodules. No focal consolidation. No pleural effusion or pneumothorax. Upper Abdomen: Better evaluated on recent CT abdomen/pelvis, noting a dominant left retroperitoneal sarcoma and left hydronephrosis. Musculoskeletal: Visualized osseous structures are within normal limits. IMPRESSION: No evidence of metastatic disease in the chest. Left retroperitoneal sarcoma with left hydronephrosis, incompletely visualized and better evaluated on recent CT abdomen/pelvis. Aortic Atherosclerosis (ICD10-I70.0). Electronically Signed   By: Julian Hy M.D.   On: 02/22/2018 10:38

## 2018-02-24 ENCOUNTER — Encounter (HOSPITAL_COMMUNITY): Payer: Self-pay | Admitting: Anesthesiology

## 2018-02-24 ENCOUNTER — Encounter (HOSPITAL_COMMUNITY): Admission: EM | Disposition: E | Payer: Self-pay | Source: Home / Self Care | Attending: Internal Medicine

## 2018-02-24 ENCOUNTER — Inpatient Hospital Stay (HOSPITAL_COMMUNITY): Payer: 59

## 2018-02-24 ENCOUNTER — Inpatient Hospital Stay (HOSPITAL_COMMUNITY): Payer: 59 | Admitting: Anesthesiology

## 2018-02-24 DIAGNOSIS — N390 Urinary tract infection, site not specified: Secondary | ICD-10-CM

## 2018-02-24 DIAGNOSIS — Z7189 Other specified counseling: Secondary | ICD-10-CM

## 2018-02-24 DIAGNOSIS — D649 Anemia, unspecified: Secondary | ICD-10-CM

## 2018-02-24 DIAGNOSIS — K56699 Other intestinal obstruction unspecified as to partial versus complete obstruction: Secondary | ICD-10-CM

## 2018-02-24 DIAGNOSIS — E46 Unspecified protein-calorie malnutrition: Secondary | ICD-10-CM

## 2018-02-24 DIAGNOSIS — C479 Malignant neoplasm of peripheral nerves and autonomic nervous system, unspecified: Secondary | ICD-10-CM

## 2018-02-24 HISTORY — PX: CYSTOSCOPY WITH STENT PLACEMENT: SHX5790

## 2018-02-24 LAB — CBC
HCT: 33.5 % — ABNORMAL LOW (ref 36.0–46.0)
Hemoglobin: 10.9 g/dL — ABNORMAL LOW (ref 12.0–15.0)
MCH: 25.5 pg — ABNORMAL LOW (ref 26.0–34.0)
MCHC: 32.5 g/dL (ref 30.0–36.0)
MCV: 78.3 fL (ref 78.0–100.0)
PLATELETS: 660 10*3/uL — AB (ref 150–400)
RBC: 4.28 MIL/uL (ref 3.87–5.11)
RDW: 19.9 % — AB (ref 11.5–15.5)
WBC: 17.5 10*3/uL — AB (ref 4.0–10.5)

## 2018-02-24 LAB — BASIC METABOLIC PANEL
Anion gap: 10 (ref 5–15)
BUN: 10 mg/dL (ref 6–20)
CALCIUM: 9 mg/dL (ref 8.9–10.3)
CO2: 18 mmol/L — ABNORMAL LOW (ref 22–32)
Chloride: 113 mmol/L — ABNORMAL HIGH (ref 98–111)
Creatinine, Ser: 1.72 mg/dL — ABNORMAL HIGH (ref 0.44–1.00)
GFR, EST AFRICAN AMERICAN: 36 mL/min — AB (ref >60)
GFR, EST NON AFRICAN AMERICAN: 31 mL/min — AB (ref >60)
GLUCOSE: 93 mg/dL (ref 70–99)
Potassium: 3.5 mmol/L (ref 3.5–5.1)
SODIUM: 141 mmol/L (ref 135–145)

## 2018-02-24 SURGERY — CYSTOSCOPY, WITH STENT INSERTION
Anesthesia: General | Site: Ureter | Laterality: Bilateral

## 2018-02-24 MED ORDER — LACTATED RINGERS IV SOLN: INTRAVENOUS | Status: DC

## 2018-02-24 MED ORDER — HYDROMORPHONE HCL 1 MG/ML IJ SOLN
INTRAMUSCULAR | Status: AC
  Filled 2018-02-24: qty 1

## 2018-02-24 MED ORDER — SODIUM CHLORIDE 0.9 % IV SOLN: INTRAVENOUS | Status: DC

## 2018-02-24 MED ORDER — FENTANYL CITRATE (PF) 100 MCG/2ML IJ SOLN
INTRAMUSCULAR | Status: AC
  Filled 2018-02-24: qty 2

## 2018-02-24 MED ORDER — ONDANSETRON HCL 4 MG/2ML IJ SOLN
INTRAMUSCULAR | Status: DC | PRN
  Administered 2018-02-24: 4 mg via INTRAVENOUS

## 2018-02-24 MED ORDER — PROPOFOL 10 MG/ML IV BOLUS
INTRAVENOUS | Status: AC
  Filled 2018-02-24: qty 20

## 2018-02-24 MED ORDER — MEPERIDINE HCL 50 MG/ML IJ SOLN: 6.2500 mg | INTRAMUSCULAR | Status: DC | PRN

## 2018-02-24 MED ORDER — MORPHINE SULFATE 2 MG/ML IJ SOLN: 2.0000 mg | INTRAMUSCULAR | Status: DC | PRN

## 2018-02-24 MED ORDER — FENTANYL CITRATE (PF) 100 MCG/2ML IJ SOLN
INTRAMUSCULAR | Status: DC | PRN
  Administered 2018-02-24 (×2): 50 ug via INTRAVENOUS

## 2018-02-24 MED ORDER — CEFAZOLIN SODIUM-DEXTROSE 2-4 GM/100ML-% IV SOLN
INTRAVENOUS | Status: AC
  Filled 2018-02-24: qty 100

## 2018-02-24 MED ORDER — MIDAZOLAM HCL 2 MG/2ML IJ SOLN
INTRAMUSCULAR | Status: AC
  Filled 2018-02-24: qty 2

## 2018-02-24 MED ORDER — PROMETHAZINE HCL 25 MG/ML IJ SOLN: 6.2500 mg | INTRAMUSCULAR | Status: DC | PRN

## 2018-02-24 MED ORDER — DEXAMETHASONE SODIUM PHOSPHATE 10 MG/ML IJ SOLN
INTRAMUSCULAR | Status: DC | PRN
  Administered 2018-02-24: 10 mg via INTRAVENOUS

## 2018-02-24 MED ORDER — LIDOCAINE HCL (CARDIAC) PF 100 MG/5ML IV SOSY
PREFILLED_SYRINGE | INTRAVENOUS | Status: DC | PRN
  Administered 2018-02-24: 50 mg via INTRAVENOUS

## 2018-02-24 MED ORDER — ONDANSETRON HCL 4 MG/2ML IJ SOLN
INTRAMUSCULAR | Status: AC
  Filled 2018-02-24: qty 2

## 2018-02-24 MED ORDER — MORPHINE SULFATE (PF) 2 MG/ML IV SOLN
2.0000 mg | INTRAVENOUS | Status: DC | PRN
  Administered 2018-02-24 (×2): 4 mg via INTRAVENOUS
  Administered 2018-02-24: 2 mg via INTRAVENOUS
  Administered 2018-02-25 – 2018-02-26 (×7): 4 mg via INTRAVENOUS
  Administered 2018-02-26: 2 mg via INTRAVENOUS
  Administered 2018-02-26: 4 mg via INTRAVENOUS
  Filled 2018-02-24 (×4): qty 2
  Filled 2018-02-24: qty 1
  Filled 2018-02-24 (×7): qty 2

## 2018-02-24 MED ORDER — CEFAZOLIN SODIUM-DEXTROSE 2-3 GM-%(50ML) IV SOLR
INTRAVENOUS | Status: DC | PRN
  Administered 2018-02-24: 2 g via INTRAVENOUS

## 2018-02-24 MED ORDER — SODIUM CHLORIDE 0.9 % IV SOLN
INTRAVENOUS | Status: DC
  Administered 2018-02-24 – 2018-03-01 (×6): via INTRAVENOUS

## 2018-02-24 MED ORDER — CEFAZOLIN SODIUM-DEXTROSE 1-4 GM/50ML-% IV SOLN
1.0000 g | Freq: Two times a day (BID) | INTRAVENOUS | Status: DC
  Administered 2018-02-24 – 2018-02-26 (×5): 1 g via INTRAVENOUS
  Filled 2018-02-24 (×8): qty 50

## 2018-02-24 MED ORDER — FLEET ENEMA 7-19 GM/118ML RE ENEM: 1.0000 | ENEMA | Freq: Once | RECTAL | Status: DC

## 2018-02-24 MED ORDER — SODIUM CHLORIDE 0.9 % IR SOLN
Status: DC | PRN
  Administered 2018-02-24: 3000 mL

## 2018-02-24 MED ORDER — HYDROMORPHONE HCL 1 MG/ML IJ SOLN
0.2500 mg | INTRAMUSCULAR | Status: DC | PRN
  Administered 2018-02-24 (×5): 0.5 mg via INTRAVENOUS

## 2018-02-24 MED ORDER — PROPOFOL 10 MG/ML IV BOLUS
INTRAVENOUS | Status: DC | PRN
  Administered 2018-02-24: 100 mg via INTRAVENOUS
  Administered 2018-02-24: 20 mg via INTRAVENOUS

## 2018-02-24 MED ORDER — LACTATED RINGERS IV SOLN
INTRAVENOUS | Status: DC | PRN
  Administered 2018-02-24: 09:00:00 via INTRAVENOUS

## 2018-02-24 MED ORDER — IOHEXOL 300 MG/ML  SOLN
INTRAMUSCULAR | Status: DC | PRN
  Administered 2018-02-24: 20 mL via URETHRAL

## 2018-02-24 MED ORDER — MORPHINE SULFATE ER 15 MG PO TBCR
15.0000 mg | EXTENDED_RELEASE_TABLET | Freq: Two times a day (BID) | ORAL | Status: DC
  Administered 2018-02-24 – 2018-02-25 (×4): 15 mg via ORAL
  Filled 2018-02-24 (×4): qty 1

## 2018-02-24 MED ORDER — FLEET ENEMA 7-19 GM/118ML RE ENEM
1.0000 | ENEMA | Freq: Once | RECTAL | Status: AC
  Administered 2018-02-24: 1 via RECTAL
  Filled 2018-02-24: qty 1

## 2018-02-24 SURGICAL SUPPLY — 13 items
BAG URO CATCHER STRL LF (MISCELLANEOUS) ×3 IMPLANT
CATH INTERMIT  6FR 70CM (CATHETERS) ×3 IMPLANT
CLOTH BEACON ORANGE TIMEOUT ST (SAFETY) ×3 IMPLANT
COVER FOOTSWITCH UNIV (MISCELLANEOUS) IMPLANT
COVER SURGICAL LIGHT HANDLE (MISCELLANEOUS) ×3 IMPLANT
GLOVE BIO SURGEON STRL SZ7.5 (GLOVE) ×3 IMPLANT
GOWN STRL REUS W/TWL LRG LVL3 (GOWN DISPOSABLE) ×6 IMPLANT
GUIDEWIRE STR DUAL SENSOR (WIRE) ×3 IMPLANT
MANIFOLD NEPTUNE II (INSTRUMENTS) ×3 IMPLANT
PACK CYSTO (CUSTOM PROCEDURE TRAY) ×3 IMPLANT
STENT URET 6FRX24 CONTOUR (STENTS) ×4 IMPLANT
TUBING CONNECTING 10 (TUBING) ×2 IMPLANT
TUBING CONNECTING 10' (TUBING) ×1

## 2018-02-24 NOTE — Anesthesia Postprocedure Evaluation (Signed)
Anesthesia Post Note  Patient: Anita Whitehead  Procedure(s) Performed: CYSTOSCOPY, BILATERAL RETROGRADE PYELOGRAM, WITH BILATERAL URETERAL STENT PLACEMENT (Bilateral Ureter)     Patient location during evaluation: PACU Anesthesia Type: General Level of consciousness: awake and alert Pain management: pain level controlled Vital Signs Assessment: post-procedure vital signs reviewed and stable Respiratory status: spontaneous breathing, nonlabored ventilation, respiratory function stable and patient connected to nasal cannula oxygen Cardiovascular status: blood pressure returned to baseline and stable Postop Assessment: no apparent nausea or vomiting Anesthetic complications: no    Last Vitals:  Vitals:   02/10/2018 1145 02/25/2018 1158  BP: 137/90 (!) 139/94  Pulse: (!) 105 (!) 110  Resp: 15 16  Temp: 36.8 C 36.8 C  SpO2: 98% 100%    Last Pain:  Vitals:   03/02/2018 1145  TempSrc:   PainSc: Asleep                 Effie Berkshire

## 2018-02-24 NOTE — Progress Notes (Signed)
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 CT scan of the chest showed no evidence of distant metastatic disease. She has severe uncontrolled pain this morning Bowel obstruction has not resolved If her symptoms does not improve over the next few days, I will consider starting her on inpatient chemotherapy  Hydronephrosis with UTI She has complicated urinary tract infection Port placement is placed on hold She has gone for surgery this morning for stent placement and cystoscopy I switched her oral antibiotics to intravenous antibiotics  Subacute bowel obstruction She has no bowel movement despite enema With her severe abdominal pain, it could be due to recurrent bowel obstruction versus pain secondary to cancer or hydronephrosis Plan to repeat enema today Plan to consider repeat imaging study tomorrow if no results  Anemia Thrombocytosis Likely related to recurrence of malignancy There is a component of iron deficiency She has received 1 dose of intravenous iron on 02/22/2018  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Moderate protein calorie malnutrition She will continue oral diet as tolerated  Goals of care She is aware that her cancer is incurable but is interested to try palliative treatment if possible I will continue to follow  Severe cancer pain I will add MS Contin in addition to breakthrough pain medicine  Discharge planning Not ready for discharge I will return tomorrow to check on her.  [name], MD 02/21/2018  7:57 AM   Subjective:  The patient was transferred from [facility] to [facility] for urology procedure for possible bilateral hydronephrosis.  She had no bowel movement since admission despite enema.  She has severe flank pain/abdominal pain this  morning and appears tearful.  She denies nausea.  She is bloated.  Objective:  Vitals:   02/23/18 2239 02/10/2018 0608  BP: 135/85 (!) 130/93  Pulse: (!) 109 (!) 106  Resp:  14  Temp:  98.3 F (36.8 C)  SpO2: 100% 100%     Intake/Output Summary (Last 24 hours) at 02/06/2018 0757 Last data filed at 02/18/2018 1443 Gross per 24 hour  Intake 1077 ml  Output 500 ml  Net 577 ml    GENERAL:alert, in severe distress from feeling uncomfortable SKIN: skin color, texture, turgor are normal, no rashes or significant lesions EYES: normal, Conjunctiva are pink and non-injected, sclera clear OROPHARYNX:no exudate, no erythema and lips, buccal mucosa, and tongue normal  NECK: supple, thyroid normal size, non-tender, without nodularity LYMPH:  no palpable lymphadenopathy in the cervical, axillary or inguinal LUNGS: clear to auscultation and percussion with normal breathing effort HEART: regular rate & rhythm and no murmurs and no lower extremity edema ABDOMEN:abdomen soft, significant tenderness over the left flank area Musculoskeletal:no cyanosis of digits and no clubbing  NEURO: alert & oriented x 3 with fluent speech, no focal motor/sensory deficits   Labs:  Lab Results  Component Value Date   WBC 13.0 (H) 02/22/2018   HGB 9.3 (L) 02/22/2018   HCT 31.2 (L) 02/22/2018   MCV 83.2 02/22/2018   PLT 508 (H) 02/22/2018   NEUTROABS 11.1 (H) 02/22/2018    Lab Results  Component Value Date   NA 129 (L) 02/21/2018   K 4.3 02/21/2018   CL 99 02/21/2018   CO2 20 (L) 02/21/2018

## 2018-02-24 NOTE — Anesthesia Procedure Notes (Signed)
Procedure Name: LMA Insertion Date/Time: 02/10/2018 10:02 AM Performed by: Glory Buff, CRNA Patient Re-evaluated:Patient Re-evaluated prior to induction Oxygen Delivery Method: Circle system utilized Preoxygenation: Pre-oxygenation with 100% oxygen Induction Type: IV induction Ventilation: Mask ventilation without difficulty LMA: LMA inserted LMA Size: 4.0 Number of attempts: 1 Placement Confirmation: positive ETCO2 Tube secured with: Tape Dental Injury: Teeth and Oropharynx as per pre-operative assessment

## 2018-02-24 NOTE — Anesthesia Preprocedure Evaluation (Addendum)
Anesthesia Evaluation  Patient identified by MRN, date of birth, ID band Patient awake    Reviewed: Allergy & Precautions, NPO status , Patient's Chart, lab work & pertinent test results  Airway Mallampati: II  TM Distance: >3 FB Neck ROM: Full    Dental  (+) Teeth Intact, Dental Advisory Given   Pulmonary former smoker,    breath sounds clear to auscultation       Cardiovascular hypertension, Pt. on home beta blockers  Rhythm:Regular Rate:Normal     Neuro/Psych  Neuromuscular disease    GI/Hepatic negative GI ROS, Neg liver ROS,   Endo/Other  negative endocrine ROS  Renal/GU negative Renal ROS     Musculoskeletal negative musculoskeletal ROS (+)   Abdominal Normal abdominal exam  (+)   Peds  Hematology negative hematology ROS (+)   Anesthesia Other Findings - HLD  Reproductive/Obstetrics                            Anesthesia Physical Anesthesia Plan  ASA: II  Anesthesia Plan: General   Post-op Pain Management:    Induction: Intravenous  PONV Risk Score and Plan: 4 or greater and Ondansetron, Dexamethasone and Midazolam  Airway Management Planned: LMA  Additional Equipment: None  Intra-op Plan:   Post-operative Plan: Extubation in OR  Informed Consent: I have reviewed the patients History and Physical, chart, labs and discussed the procedure including the risks, benefits and alternatives for the proposed anesthesia with the patient or authorized representative who has indicated his/her understanding and acceptance.   Dental advisory given  Plan Discussed with: CRNA  Anesthesia Plan Comments:        Lab Results  Component Value Date   WBC 13.0 (H) 02/22/2018   HGB 9.3 (L) 02/22/2018   HCT 31.2 (L) 02/22/2018   MCV 83.2 02/22/2018   PLT 508 (H) 02/22/2018   Lab Results  Component Value Date   CREATININE 1.01 (H) 02/21/2018   BUN 14 02/21/2018   NA 129 (L)  02/21/2018   K 4.3 02/21/2018   CL 99 02/21/2018   CO2 20 (L) 02/21/2018   Lab Results  Component Value Date   INR 1.00 06/01/2017   EKG: normal sinus rhythm.  Echo: - Left ventricle: The cavity size was normal. There was mild   concentric hypertrophy. Systolic function was vigorous. The   estimated ejection fraction was in the range of 65% to 70%. Wall   motion was normal; there were no regional wall motion   abnormalities. Doppler parameters are consistent with abnormal   left ventricular relaxation (grade 1 diastolic dysfunction).   There was no evidence of elevated ventricular filling pressure by   Doppler parameters. - Aortic valve: There was no regurgitation. - Left atrium: The atrium was normal in size. - Right ventricle: Systolic function was normal. - Right atrium: The atrium was normal in size. - Tricuspid valve: There was no regurgitation. - Pulmonic valve: There was no regurgitation. - Pulmonary arteries: Systolic pressure could not be accurately   estimated. - Inferior vena cava: The vessel was normal in size. - Pericardium, extracardiac: There was no pericardial effusion.   Anesthesia Quick Evaluation

## 2018-02-24 NOTE — Transfer of Care (Signed)
Immediate Anesthesia Transfer of Care Note  Patient: Anita Whitehead  Procedure(s) Performed: CYSTOSCOPY, BILATERAL RETROGRADE PYELOGRAM, WITH BILATERAL URETERAL STENT PLACEMENT (Bilateral Ureter)  Patient Location: PACU  Anesthesia Type:General  Level of Consciousness: awake, alert  and oriented  Airway & Oxygen Therapy: Patient Spontanous Breathing and Patient connected to face mask oxygen  Post-op Assessment: Report given to RN and Post -op Vital signs reviewed and stable  Post vital signs: Reviewed and stable  Last Vitals:  Vitals Value Taken Time  BP    Temp    Pulse    Resp    SpO2      Last Pain:  Vitals:   02/05/2018 0736  TempSrc:   PainSc: 5       Patients Stated Pain Goal: 2 (29/29/09 0301)  Complications: No apparent anesthesia complications

## 2018-02-24 NOTE — Op Note (Signed)
Operative Note  Preoperative diagnosis:  1.  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  Cystoscopy with bilateral retrograde pyelogram and bilateral ureteral stent placement  Surgeon: Link Snuffer, MD  Assistants: None  Anesthesia: General  Complications: None immediate  EBL: Minimal  Specimens: 1.  None  Drains/Catheters: 1.  6 X 24 double-J ureteral stents bilaterally  Intraoperative findings: 1.  Normal urethra and bladder 2.  Right retrograde pyelogram revealed a filling defect at the level of the extrinsic compression with upstream hydroureteronephrosis.  There was lateral deviation of the ureter as expected. 3.  Left native ureteral orifice with blind-ending.  Boari flap reimplantation site at the dome of the bladder towards the left with left retrograde pyelogram revealing extrinsic compression causing hydroureteronephrosis.  Indication: 62 year old female with a history of retroperitoneal sarcoma status post resection in the past that required ureteral excision with Boari flap reimplant.  She has developed a significant recurrence in the abdomen.  This is caused bilateral hydronephrosis.  Her kidney function is stable however, plan is to proceed with chemotherapy next week.  She was also experiencing severe pain.  Therefore, decision was made to proceed with bilateral ureteral stent placement to maximize kidney function and to potentially improve discomfort/pain.  She understands there is a good possibility the pain will persist considering the large intra-abdominal mass.  Description of procedure:  The patient was identified and consent was obtained.  The patient was taken to the operating room and placed in the supine position.  The patient was placed under general anesthesia.  Perioperative  antibiotics were administered.  The patient was placed in dorsal lithotomy.  Patient was prepped and draped in a standard sterile fashion and a timeout was performed.  A 21 French rigid cystoscope was advanced into the urethra and into the bladder.  The right distal most portion of the ureter was cannulated with an open-ended ureteral catheter.  Retrograde pyelogram was performed with the findings noted above.  A sensor wire was then advanced up to the kidney under fluoroscopic guidance.  A 6 X 24 double-J ureteral stent was advanced up to the kidney under fluoroscopic guidance.  The wire was withdrawn and fluoroscopy confirmed good proximal placement and direct visualization confirmed a good coil within the bladder.   The left native ureteral orifice was cannulated with a sensor wire and metal blind ending as expected.  I located the left ureteral reimplantation site and cannulated that with a sensor wire.  I then advanced a open-ended ureteral catheter over the wire into the ureter and remove the wire.  I shot a retrograde pyelogram with the findings noted above.  And re-advance a wire up to the kidney under fluoroscopic guidance.  The open-ended ureteral catheter was removed and a 6 x 24 double-J ureteral stent was placed in a standard fashion followed by removal of the wire.  Fluoroscopy confirmed proximal placement and direct visualization confirmed a good coil within the bladder.  The bladder was drained and the scope withdrawn.  Patient tolerated procedure well and was stable postoperatively.  Plan: Stents can be exchanged every 3 months.  No further urological intervention necessary at this time.  May proceed with chemotherapy when deemed appropriate.

## 2018-02-24 NOTE — Progress Notes (Signed)
PROGRESS NOTE    Anita Whitehead  VHQ:[id number] DOB: 1955-12-26 DOA: 02/19/2018 PCP: [name], NP     Brief Narrative:  Anita Whitehead is a 62 year old female with pelvic and retroperitoneal sarcoma who was admitted for abdominal pain and nausea. KUB showed partial small bowel obstruction secondary to the tumor. General surgery was consulted for SBO management. The patient's bowel obstruction slowly improved and the patient's diet was advanced to full liquid. Hematology was consulted for management of the sarcoma and suggested that port placement and palliative chemotherapy were the next steps. The KUB also showed bilateral hydronephrosis and urology was consulted. Urine culture grew klebsiella pneumoniae. Patient was transferred from [facility] Medicine teaching service at [facility] to Hospitalist service at [facility] for ureteral stent placement.   New events last 24 hours / Subjective: Patient seen and evaluated post-procedure. She is feeling well. She denies any abdominal pain. Has left sided flank pain, well controlled at this time. No other complaints. Has been passing gas but no BM.   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 Alvy Bimler following. Possibly will place PICC line for initiation of chemotherapy inpatient vs outpatient depending on how patient is doing tomorrow. Discussed with Dr. Alvy Bimler today.  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 Gloriann Loan 7/21  -Stents  can be exchanged every 3 months.  No further urological intervention necessary at this time  Partialsmall bowel obstruction -General surgery now signed off -No BM so far. Repeat enema today.  -Repeat KUB in AM  -Currently on full liquid diet   Klebsiella UTI -Given Rocephin 7/17 -Bactrim 7/19-7/20 -Ancef started 4/13  -Given complicated UTI and ureteral stent placement, would treat total 10-14 days   AKI -Baseline Cr 0.9. On admission, Cr 1.23 --> 1.01 -Resolved, monitor BMP    HTN -Continue coreg     DVT prophylaxis: Lovenox Code Status: Full Family Communication: Multiple family members at bedside  Disposition Plan: Pending improvement   Consultants:   Oncology   Urology   Procedures:   S/p cystoscopy with bilateral ureteral stent placement Dr. Gloriann Loan 7/21   Antimicrobials:  Anti-infectives (From admission, onward)   Start     Dose/Rate Route Frequency Ordered Stop   02/15/2018 1000  ceFAZolin (ANCEF) IVPB 1 g/50 mL premix     1 g 100 mL/hr over 30 Minutes Intravenous Every 12 hours 02/05/2018 0741     03/02/2018 0942  ceFAZolin (ANCEF) 2-4 GM/100ML-% IVPB    Note to Pharmacy:  Chrystine Oiler: cabinet override      02/12/2018 0942 03/05/2018 2159   02/22/18 1800  sulfamethoxazole-trimethoprim (BACTRIM,SEPTRA) 400-80 MG per tablet 1 tablet  Status:  Discontinued     1 tablet Oral Every 12 hours 02/22/18 1620 02/23/2018 0741   02/23/2018 1445  cefTRIAXone (ROCEPHIN) 1 g in sodium chloride 0.9 % 100 mL IVPB     1 g 200 mL/hr over 30 Minutes Intravenous  Once 02/19/2018 1432 02/20/18 1515       Objective: Vitals:   02/27/2018  1122 02/16/2018 1130 03/01/2018 1145 03/03/2018 1158  BP:  (!) 143/94 137/90 (!) 139/94  Pulse: (!) 107 (!) 114 (!) 105 (!) 110  Resp: 18 (!) 25 15 16   Temp:   98.2 F (36.8 C) 98.2 F (36.8 C)  TempSrc:      SpO2: 100% 98% 98% 100%  Weight:      Height:        Intake/Output Summary (Last 24 hours) at 02/06/2018 1208 Last data filed at  03/03/2018 1138 Gross per 24 hour  Intake 1667 ml  Output 825 ml  Net 842 ml   Filed Weights   02/26/2018 1121 03/04/2018 2300  Weight: 48.5 kg (107 lb) 47.6 kg (104 lb 15 oz)    Examination:  General exam: Appears calm and comfortable  Respiratory system: Clear to auscultation. Respiratory effort normal. Cardiovascular system: S1 & S2 heard, tachycardic, regular rhythm. No JVD, murmurs, rubs, gallops or clicks. No pedal edema. Gastrointestinal system: Abdomen is nondistended, +firm to touch on left abdomen, soft and nontender. No organomegaly or masses felt. Normal bowel sounds heard. Central nervous system: Alert and oriented. No focal neurological deficits. Extremities: Symmetric 5 x 5 power. Skin: No rashes, lesions or ulcers Psychiatry: Judgement and insight appear normal. Mood & affect appropriate.   Data Reviewed: I have personally reviewed following labs and imaging studies  CBC: Recent Labs  Lab 02/23/2018 1256 02/21/18 0235 02/22/18 0625  WBC 12.8* 14.2* 13.0*  NEUTROABS  --   --  11.1*  HGB 10.9* 9.7* 9.3*  HCT 33.8* 29.8* 31.2*  MCV 77.5* 78.2 83.2  PLT 667* 601* 144*   Basic Metabolic Panel: Recent Labs  Lab 03/01/2018 1256 02/21/18 0235  NA 129* 129*  K 3.8 4.3  CL 92* 99  CO2 24 20*  GLUCOSE 98 94  BUN 20 14  CREATININE 1.23* 1.01*  CALCIUM 8.9 8.3*   GFR: Estimated Creatinine Clearance: 45.1 mL/min (A) (by C-G formula based on SCr of 1.01 mg/dL (H)). Liver Function Tests: Recent Labs  Lab 02/05/2018 1256  AST 16  ALT 10  ALKPHOS 70  BILITOT 0.4  PROT 6.7  ALBUMIN 2.8*   Recent Labs  Lab 03/04/2018 1256  LIPASE 22   No results for input(s): AMMONIA in the last 168 hours. Coagulation Profile: No results for input(s): INR, PROTIME in the last 168 hours. Cardiac Enzymes: No results for input(s): CKTOTAL, CKMB, CKMBINDEX, TROPONINI in the last 168 hours. BNP (last 3 results) No results for input(s): PROBNP in the last 8760 hours. HbA1C: No  results for input(s): HGBA1C in the last 72 hours. CBG: No results for input(s): GLUCAP in the last 168 hours. Lipid Profile: No results for input(s): CHOL, HDL, LDLCALC, TRIG, CHOLHDL, LDLDIRECT in the last 72 hours. Thyroid Function Tests: No results for input(s): TSH, T4TOTAL, FREET4, T3FREE, THYROIDAB in the last 72 hours. Anemia Panel: Recent Labs    02/22/18 0625  FERRITIN 257  TIBC 167*  IRON 12*   Sepsis Labs: Recent Labs  Lab 02/09/2018 1214 02/15/2018 1333 02/21/18 0008 02/21/18 0235  LATICACIDVEN 1.91* 2.20* 1.2 1.2    Recent Results (from the past 240 hour(s))  Urine culture     Status: Abnormal   Collection Time: 02/08/2018  1:56 PM  Result Value Ref Range Status   Specimen Description URINE, RANDOM  Final   Special Requests   Final    NONE Performed at [facility], [place] [address] [place], [place] [address]    Culture >=100,000  COLONIES/mL KLEBSIELLA PNEUMONIAE (A)  Final   Report Status 02/22/2018 FINAL  Final   Organism ID, Bacteria KLEBSIELLA PNEUMONIAE (A)  Final      Susceptibility   Klebsiella pneumoniae - MIC*    AMPICILLIN RESISTANT Resistant     CEFAZOLIN <=4 SENSITIVE Sensitive     CEFTRIAXONE <=1 SENSITIVE Sensitive     CIPROFLOXACIN <=0.25 SENSITIVE Sensitive     GENTAMICIN <=1 SENSITIVE Sensitive     IMIPENEM <=0.25 SENSITIVE Sensitive     NITROFURANTOIN <=16 SENSITIVE Sensitive     TRIMETH/SULFA <=20 SENSITIVE Sensitive     AMPICILLIN/SULBACTAM 4 SENSITIVE Sensitive     PIP/TAZO <=4 SENSITIVE Sensitive     Extended ESBL NEGATIVE Sensitive     * >=100,000 COLONIES/mL KLEBSIELLA PNEUMONIAE       Radiology Studies: Dg C-arm 1-60 Min-no Report  Result Date: 03/03/2018 Fluoroscopy was utilized by the requesting physician.  No radiographic interpretation.      Scheduled Meds: . carvedilol  12.5 mg Oral BID  . enoxaparin (LOVENOX) injection  30 mg Subcutaneous Q24H  . feeding supplement  1 Container Oral TID BM  . HYDROmorphone       . HYDROmorphone      . morphine  15 mg Oral Q12H  . sodium phosphate  1 enema Rectal Once   Continuous Infusions: . ceFAZolin    .  ceFAZolin (ANCEF) IV       LOS: 4 days    Time spent: 35 minutes   Dessa Phi, DO [facility] [email or web address] Password [facility] 02/16/2018, 12:08 PM

## 2018-02-25 ENCOUNTER — Encounter (HOSPITAL_COMMUNITY): Payer: Self-pay | Admitting: Urology

## 2018-02-25 ENCOUNTER — Inpatient Hospital Stay (HOSPITAL_COMMUNITY): Payer: 59

## 2018-02-25 DIAGNOSIS — R110 Nausea: Secondary | ICD-10-CM

## 2018-02-25 LAB — BASIC METABOLIC PANEL
ANION GAP: 10 (ref 5–15)
BUN: 12 mg/dL (ref 6–20)
CALCIUM: 8.7 mg/dL — AB (ref 8.9–10.3)
CO2: 17 mmol/L — AB (ref 22–32)
Chloride: 113 mmol/L — ABNORMAL HIGH (ref 98–111)
Creatinine, Ser: 1.3 mg/dL — ABNORMAL HIGH (ref 0.44–1.00)
GFR calc Af Amer: 51 mL/min — ABNORMAL LOW (ref >60)
GFR calc non Af Amer: 44 mL/min — ABNORMAL LOW (ref >60)
GLUCOSE: 107 mg/dL — AB (ref 70–99)
Potassium: 4.5 mmol/L (ref 3.5–5.1)
Sodium: 140 mmol/L (ref 135–145)

## 2018-02-25 LAB — CBC
HEMATOCRIT: 27.9 % — AB (ref 36.0–46.0)
Hemoglobin: 9 g/dL — ABNORMAL LOW (ref 12.0–15.0)
MCH: 25 pg — AB (ref 26.0–34.0)
MCHC: 32.3 g/dL (ref 30.0–36.0)
MCV: 77.5 fL — AB (ref 78.0–100.0)
PLATELETS: 575 10*3/uL — AB (ref 150–400)
RBC: 3.6 MIL/uL — ABNORMAL LOW (ref 3.87–5.11)
RDW: 19.9 % — ABNORMAL HIGH (ref 11.5–15.5)
WBC: 16.9 10*3/uL — AB (ref 4.0–10.5)

## 2018-02-25 MED ORDER — POLYETHYLENE GLYCOL 3350 17 G PO PACK
17.0000 g | PACK | Freq: Every day | ORAL | Status: DC
  Administered 2018-02-25: 17 g via ORAL
  Filled 2018-02-25: qty 1

## 2018-02-25 MED ORDER — SENNOSIDES-DOCUSATE SODIUM 8.6-50 MG PO TABS
1.0000 | ORAL_TABLET | Freq: Every day | ORAL | Status: DC
  Administered 2018-02-25: 1 via ORAL
  Filled 2018-02-25: qty 1

## 2018-02-25 NOTE — Progress Notes (Signed)
PROGRESS NOTE    Anita Whitehead  OFB:[id number] DOB: 05/29/1956 DOA: 02/12/2018 PCP: [name], NP     Brief Narrative:  Anita Whitehead is a 62 year old female with pelvic and retroperitoneal sarcoma who was admitted for abdominal pain and nausea. KUB showed partial small bowel obstruction secondary to the tumor. General surgery was consulted for SBO management. The patient's bowel obstruction slowly improved and the patient's diet was advanced to full liquid. Hematology was consulted for management of the sarcoma and suggested that port placement and palliative chemotherapy were the next steps. The KUB also showed bilateral hydronephrosis and urology was consulted. Urine culture grew klebsiella pneumoniae. Patient was transferred from [facility] Medicine teaching service at [facility] to Hospitalist service at [facility] for ureteral stent placement. She underwent cystoscopy with bilateral ureteral stent placement Dr. Gloriann Loan 7/21.  New events last 24 hours / Subjective: Doing well this morning, no BM despite enema. Tolerating full liquid diet without nausea, vomiting. Urinating well.   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 Gloriann Loan 7/21  -Stents can be exchanged every 3 months.  No further urological intervention necessary at this time  Partialsmall bowel obstruction -General  surgery now signed off. Recommended continuing full liquid diet -KUB 7/22 revealed distal SBO with contrast laden stool within colon  -No BM so far. Repeat enema today. Will also order miralax, senna  -Continue full liquid diet   Klebsiella UTI -Given Rocephin 7/17 -Bactrim 7/19-7/20 -Ancef started 7/82  -Given complicated UTI and ureteral stent placement, would treat total 10-14 days  -Trend WBC   AKI -Baseline Cr 0.9. On admission, Cr 1.23 --> 1.01 --> 1.72 --> 1.30  -Monitor BMP   -IVF   HTN -Continue coreg     DVT prophylaxis: Lovenox Code Status: Full Family Communication: No family at bedside  Disposition Plan: Pending improvement, need BM    Consultants:   Oncology   Urology   Procedures:   S/p cystoscopy with bilateral ureteral stent placement Dr. Gloriann Loan 7/21   Antimicrobials:  Anti-infectives (From admission, onward)   Start     Dose/Rate Route Frequency Ordered Stop   02/26/2018 1000  ceFAZolin (ANCEF) IVPB 1 g/50 mL premix     1 g 100 mL/hr over 30 Minutes Intravenous Every 12 hours 02/23/2018 0741     02/05/2018 0942  ceFAZolin (ANCEF) 2-4 GM/100ML-% IVPB    Note to Pharmacy:  Chrystine Oiler: cabinet override      02/11/2018 0942 02/13/2018 1000   02/22/18 1800  sulfamethoxazole-trimethoprim (BACTRIM,SEPTRA) 400-80 MG per tablet 1 tablet  Status:  Discontinued     1 tablet Oral Every 12 hours 02/22/18 1620 02/25/2018 0741   02/10/2018 1445  cefTRIAXone (ROCEPHIN) 1 g in sodium chloride 0.9 % 100 mL IVPB     1 g 200 mL/hr over 30 Minutes Intravenous  Once 02/19/2018 1432 02/09/2018 1515       Objective: Vitals:  02/23/2018 1158 02/09/2018 1634 02/09/2018 2149 02/25/18 0545  BP: (!) 139/94 130/89 (!) 150/99 (!) 143/91  Pulse: (!) 110 (!) 101 (!) 107 (!) 103  Resp: 16 16 20 20   Temp: 98.2 F (36.8 C)  98.1 F (36.7 C) 98.7 F (37.1 C)  TempSrc:   Oral Oral  SpO2: 100% 100% 100% 100%  Weight:      Height:        Intake/Output Summary (Last 24 hours) at  02/25/2018 0944 Last data filed at 02/25/2018 0711 Gross per 24 hour  Intake 2378.34 ml  Output 1050 ml  Net 1328.34 ml   Filed Weights   02/21/2018 1121 02/17/2018 2300  Weight: 48.5 kg (107 lb) 47.6 kg (104 lb 15 oz)    Examination: General exam: Appears calm and comfortable  Respiratory system: Clear to auscultation. Respiratory effort normal. Cardiovascular system: S1 & S2 heard, tachycardic, regular rhythm. No JVD, murmurs, rubs, gallops or clicks. No pedal edema. Gastrointestinal system: Abdomen is nondistended, soft and nontender. No organomegaly or masses felt. Normal bowel sounds heard.  Central nervous system: Alert and oriented. No focal neurological deficits. Extremities: Symmetric 5 x 5 power. Skin: No rashes, lesions or ulcers Psychiatry: Judgement and insight appear normal. Mood & affect appropriate.    Data Reviewed: I have personally reviewed following labs and imaging studies  CBC: Recent Labs  Lab 02/27/2018 1256 02/21/18 0235 02/22/18 0625 02/19/2018 1230 02/25/18 0522  WBC 12.8* 14.2* 13.0* 17.5* 16.9*  NEUTROABS  --   --  11.1*  --   --   HGB 10.9* 9.7* 9.3* 10.9* 9.0*  HCT 33.8* 29.8* 31.2* 33.5* 27.9*  MCV 77.5* 78.2 83.2 78.3 77.5*  PLT 667* 601* 508* 660* 937*   Basic Metabolic Panel: Recent Labs  Lab 02/19/2018 1256 02/21/18 0235 02/25/2018 1230 02/25/18 0522  NA 129* 129* 141 140  K 3.8 4.3 3.5 4.5  CL 92* 99 113* 113*  CO2 24 20* 18* 17*  GLUCOSE 98 94 93 107*  BUN 20 14 10 12   CREATININE 1.23* 1.01* 1.72* 1.30*  CALCIUM 8.9 8.3* 9.0 8.7*   GFR: Estimated Creatinine Clearance: 35 mL/min (A) (by C-G formula based on SCr of 1.3 mg/dL (H)). Liver Function Tests: Recent Labs  Lab 02/17/2018 1256  AST 16  ALT 10  ALKPHOS 70  BILITOT 0.4  PROT 6.7  ALBUMIN 2.8*   Recent Labs  Lab 03/06/2018 1256  LIPASE 22   No results for input(s): AMMONIA in the last 168 hours. Coagulation Profile: No results for input(s): INR, PROTIME in the last 168  hours. Cardiac Enzymes: No results for input(s): CKTOTAL, CKMB, CKMBINDEX, TROPONINI in the last 168 hours. BNP (last 3 results) No results for input(s): PROBNP in the last 8760 hours. HbA1C: No results for input(s): HGBA1C in the last 72 hours. CBG: No results for input(s): GLUCAP in the last 168 hours. Lipid Profile: No results for input(s): CHOL, HDL, LDLCALC, TRIG, CHOLHDL, LDLDIRECT in the last 72 hours. Thyroid Function Tests: No results for input(s): TSH, T4TOTAL, FREET4, T3FREE, THYROIDAB in the last 72 hours. Anemia Panel: No results for input(s): VITAMINB12, FOLATE, FERRITIN, TIBC, IRON, RETICCTPCT in the last 72 hours. Sepsis Labs: Recent Labs  Lab 02/27/2018 1214 03/02/2018 1333 02/21/18 0008 02/21/18 0235  LATICACIDVEN 1.91* 2.20* 1.2 1.2    Recent Results (from the past 240 hour(s))  Urine culture     Status: Abnormal   Collection Time: 03/02/2018  1:56 PM  Result Value Ref Range Status  Specimen Description URINE, RANDOM  Final   Special Requests   Final    NONE Performed at [facility], [place] [address] [place], [place] [address]    Culture >=100,000 COLONIES/mL KLEBSIELLA PNEUMONIAE (A)  Final   Report Status 02/22/2018 FINAL  Final   Organism ID, Bacteria KLEBSIELLA PNEUMONIAE (A)  Final      Susceptibility   Klebsiella pneumoniae - MIC*    AMPICILLIN RESISTANT Resistant     CEFAZOLIN <=4 SENSITIVE Sensitive     CEFTRIAXONE <=1 SENSITIVE Sensitive     CIPROFLOXACIN <=0.25 SENSITIVE Sensitive     GENTAMICIN <=1 SENSITIVE Sensitive     IMIPENEM <=0.25 SENSITIVE Sensitive     NITROFURANTOIN <=16 SENSITIVE Sensitive     TRIMETH/SULFA <=20 SENSITIVE Sensitive     AMPICILLIN/SULBACTAM 4 SENSITIVE Sensitive     PIP/TAZO <=4 SENSITIVE Sensitive     Extended ESBL NEGATIVE Sensitive     * >=100,000 COLONIES/mL KLEBSIELLA PNEUMONIAE       Radiology Studies: Dg Abd 1 View  Result Date: 02/25/2018 CLINICAL DATA:  Generalized abdominal pain and  distension this morning. History of small-bowel obstruction. EXAM: ABDOMEN - 1 VIEW COMPARISON:  KUB of February 21, 2018 FINDINGS: There remain loops of moderately distended gas-filled small bowel which are more conspicuous today. There is contrast within normal caliber colon. There are bilateral ureteral stents. There surgical clips in the left aspect of the pelvis. IMPRESSION: Findings consistent with distal small bowel obstruction. The degree of bowel distention has increased to a mild degree. Moderate amount of contrast laden stool within the colon. Electronically Signed   By: David  Martinique M.D.   On: 02/25/2018 07:53   Dg C-arm 1-60 Min-no Report  Result Date: 03/06/2018 Fluoroscopy was utilized by the requesting physician.  No radiographic interpretation.      Scheduled Meds: . carvedilol  12.5 mg Oral BID  . enoxaparin (LOVENOX) injection  30 mg Subcutaneous Q24H  . feeding supplement  1 Container Oral TID BM  . morphine  15 mg Oral Q12H  . polyethylene glycol  17 g Oral Daily  . senna-docusate  1 tablet Oral QHS   Continuous Infusions: . sodium chloride 100 mL/hr at 02/25/18 0615  .  ceFAZolin (ANCEF) IV Stopped (02/25/18 0001)     LOS: 5 days    Time spent: 20 minutes   Dessa Phi, DO [facility] [email or web address] Password [facility] 02/25/2018, 9:44 AM

## 2018-02-25 NOTE — Progress Notes (Signed)
Disability paperwork completed and taken to Ms. Wilma at front desk to pick up per patient request.

## 2018-02-25 NOTE — Progress Notes (Signed)
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 CT scan of the chest showed no evidence of distant metastatic disease. Bowel obstruction has not resolved X-ray abdomen showed persistent bowel obstruction  Hydronephrosis with UTI She has complicated urinary tract infection, will continue IV antibiotics Port placement is placed on hold She had successful stent placement by urologist. Continue close monitoring of kidney function  Subacute bowel obstruction She has no bowel movement despite enema With her severe abdominal pain, it could be due to recurrent bowel obstruction versus pain secondary to cancer or hydronephrosis Plan to repeat enema today  Anemia Thrombocytosis Likely related to recurrence of malignancy There is a component of iron deficiency She has received 1 dose of intravenous iron on 02/22/2018, will consider second dose of iron later this week  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Moderate protein calorie malnutrition She will continue oral diet as tolerated  Goals of care She is aware that her cancer is incurable but is interested to try palliative treatment if possible I will continue to follow  Severe cancer pain I will add MS Contin in addition to breakthrough pain medicine Pain is better controlled  Poor venous access I recommend PICC line placement  Discharge planning Not ready for discharge I will return tomorrow to check on her.  [name], MD 02/25/2018  8:18 AM   Subjective:  Her pain is better controlled since yesterday.  She continues to have no bowel movement, since admission.  Her abdomen remains bloated but she denies nausea.  She has no benefit from Fleet enema for 3 days. She denies hematuria.  Objective:  Vitals:   02/23/2018 2149 02/25/18 0545  BP:  (!) 150/99 (!) 143/91  Pulse: (!) 107 (!) 103  Resp: 20 20  Temp: 98.1 F (36.7 C) 98.7 F (37.1 C)  SpO2: 100% 100%     Intake/Output Summary (Last 24 hours) at 02/25/2018 0818 Last data filed at 02/25/2018 3220 Gross per 24 hour  Intake 2378.34 ml  Output 1250 ml  Net 1128.34 ml    GENERAL:alert, no distress and comfortable SKIN: skin color, texture, turgor are normal, no rashes or significant lesions EYES: normal, Conjunctiva are pink and non-injected, sclera clear OROPHARYNX:no exudate, no erythema and lips, buccal mucosa, and tongue normal  NECK: supple, thyroid normal size, non-tender, without nodularity LYMPH:  no palpable lymphadenopathy in the cervical, axillary or inguinal LUNGS: clear to auscultation and percussion with normal breathing effort HEART: regular rate & rhythm and no murmurs and no lower extremity edema ABDOMEN:abdomen soft, distended with no bowel sounds Musculoskeletal:no cyanosis of digits and no clubbing  NEURO: alert & oriented x 3 with fluent speech, no focal motor/sensory deficits   Labs:  Lab Results  Component Value Date   WBC 16.9 (H) 02/25/2018   HGB 9.0 (L) 02/25/2018   HCT 27.9 (L) 02/25/2018   MCV 77.5 (L) 02/25/2018   PLT 575 (H) 02/25/2018   NEUTROABS 11.1 (H) 02/22/2018    Lab Results  Component Value Date   NA 140 02/25/2018   K 4.5 02/25/2018   CL 113 (H) 02/25/2018   CO2 17 (L) 02/25/2018    Studies:  Dg Abd 1 View  Result Date: 02/25/2018 CLINICAL DATA:  Generalized abdominal pain and distension this morning. History of small-bowel obstruction. EXAM: ABDOMEN - 1 VIEW COMPARISON:  KUB of February 21, 2018 FINDINGS: There remain loops of moderately distended gas-filled small bowel which are more conspicuous today. There is contrast within normal caliber colon. There are bilateral ureteral stents. There surgical clips in the left aspect of the pelvis. IMPRESSION: Findings consistent with distal small bowel obstruction. The degree  of bowel distention has increased to a mild degree. Moderate amount of contrast laden stool within the colon. Electronically Signed   By: David  Martinique M.D.   On: 02/25/2018 07:53   Dg C-arm 1-60 Min-no Report  Result Date: 02/18/2018 Fluoroscopy was utilized by the requesting physician.  No radiographic interpretation.

## 2018-02-26 ENCOUNTER — Inpatient Hospital Stay (HOSPITAL_COMMUNITY): Payer: 59

## 2018-02-26 ENCOUNTER — Other Ambulatory Visit: Payer: Self-pay | Admitting: Hematology and Oncology

## 2018-02-26 ENCOUNTER — Inpatient Hospital Stay: Payer: Self-pay

## 2018-02-26 DIAGNOSIS — Z931 Gastrostomy status: Secondary | ICD-10-CM

## 2018-02-26 DIAGNOSIS — K56601 Complete intestinal obstruction, unspecified as to cause: Secondary | ICD-10-CM

## 2018-02-26 DIAGNOSIS — Z515 Encounter for palliative care: Secondary | ICD-10-CM

## 2018-02-26 LAB — URINALYSIS, ROUTINE W REFLEX MICROSCOPIC
BILIRUBIN URINE: NEGATIVE
GLUCOSE, UA: NEGATIVE mg/dL
KETONES UR: NEGATIVE mg/dL
NITRITE: NEGATIVE
PROTEIN: 30 mg/dL — AB
RBC / HPF: >50 RBC/hpf — ABNORMAL HIGH (ref 0–5)
Specific Gravity, Urine: 1.013 (ref 1.005–1.030)
pH: 5 (ref 5.0–8.0)

## 2018-02-26 LAB — BASIC METABOLIC PANEL
Anion gap: 7 (ref 5–15)
BUN: 13 mg/dL (ref 6–20)
CALCIUM: 8.7 mg/dL — AB (ref 8.9–10.3)
CHLORIDE: 113 mmol/L — AB (ref 98–111)
CO2: 20 mmol/L — AB (ref 22–32)
Creatinine, Ser: 0.99 mg/dL (ref 0.44–1.00)
GFR calc non Af Amer: >60 mL/min (ref >60)
Glucose, Bld: 94 mg/dL (ref 70–99)
Potassium: 3.8 mmol/L (ref 3.5–5.1)
Sodium: 140 mmol/L (ref 135–145)

## 2018-02-26 LAB — CBC
HEMATOCRIT: 29.9 % — AB (ref 36.0–46.0)
HEMOGLOBIN: 9.7 g/dL — AB (ref 12.0–15.0)
MCH: 25.3 pg — ABNORMAL LOW (ref 26.0–34.0)
MCHC: 32.4 g/dL (ref 30.0–36.0)
MCV: 78.1 fL (ref 78.0–100.0)
Platelets: 550 10*3/uL — ABNORMAL HIGH (ref 150–400)
RBC: 3.83 MIL/uL — AB (ref 3.87–5.11)
RDW: 20.4 % — AB (ref 11.5–15.5)
WBC: 16.7 10*3/uL — AB (ref 4.0–10.5)

## 2018-02-26 MED ORDER — HYDRALAZINE HCL 20 MG/ML IJ SOLN
5.0000 mg | INTRAMUSCULAR | Status: DC | PRN
  Administered 2018-02-26 – 2018-02-27 (×3): 5 mg via INTRAVENOUS
  Filled 2018-02-26 (×3): qty 1

## 2018-02-26 MED ORDER — SODIUM CHLORIDE 0.9% FLUSH: 9.0000 mL | INTRAVENOUS | Status: DC | PRN

## 2018-02-26 MED ORDER — SODIUM CHLORIDE 0.9 % IV SOLN
125.0000 mg | Freq: Once | INTRAVENOUS | Status: AC
  Administered 2018-02-26: 125 mg via INTRAVENOUS
  Filled 2018-02-26: qty 10

## 2018-02-26 MED ORDER — ENOXAPARIN SODIUM 40 MG/0.4ML ~~LOC~~ SOLN
40.0000 mg | SUBCUTANEOUS | Status: DC
  Administered 2018-02-26: 40 mg via SUBCUTANEOUS
  Filled 2018-02-26 (×2): qty 0.4

## 2018-02-26 MED ORDER — ONDANSETRON HCL 40 MG/20ML IJ SOLN
8.0000 mg | Freq: Three times a day (TID) | INTRAMUSCULAR | Status: DC | PRN
  Filled 2018-02-26: qty 4

## 2018-02-26 MED ORDER — NALOXONE HCL 0.4 MG/ML IJ SOLN: 0.4000 mg | INTRAMUSCULAR | Status: DC | PRN

## 2018-02-26 MED ORDER — PROCHLORPERAZINE EDISYLATE 10 MG/2ML IJ SOLN: 10.0000 mg | Freq: Four times a day (QID) | INTRAMUSCULAR | Status: DC | PRN

## 2018-02-26 MED ORDER — DIPHENHYDRAMINE HCL 12.5 MG/5ML PO ELIX: 12.5000 mg | ORAL_SOLUTION | Freq: Four times a day (QID) | ORAL | Status: DC | PRN

## 2018-02-26 MED ORDER — MORPHINE SULFATE (PF) 4 MG/ML IV SOLN
6.0000 mg | INTRAVENOUS | Status: DC | PRN
  Administered 2018-02-26 (×4): 6 mg via INTRAVENOUS
  Filled 2018-02-26 (×4): qty 2

## 2018-02-26 MED ORDER — HYDROMORPHONE 1 MG/ML IV SOLN
INTRAVENOUS | Status: DC
  Administered 2018-02-26: 0.905 mg via INTRAVENOUS
  Administered 2018-02-26: 30 mg via INTRAVENOUS
  Administered 2018-02-27: 2.32 mg via INTRAVENOUS
  Administered 2018-02-27: 2.29 mg via INTRAVENOUS
  Administered 2018-02-27: 3.42 mg via INTRAVENOUS
  Filled 2018-02-26: qty 30

## 2018-02-26 MED ORDER — HYDRALAZINE HCL 20 MG/ML IJ SOLN
10.0000 mg | Freq: Once | INTRAMUSCULAR | Status: AC
  Administered 2018-02-26: 10 mg via INTRAVENOUS
  Filled 2018-02-26: qty 1

## 2018-02-26 MED ORDER — DIPHENHYDRAMINE HCL 50 MG/ML IJ SOLN: 12.5000 mg | Freq: Four times a day (QID) | INTRAMUSCULAR | Status: DC | PRN

## 2018-02-26 MED ORDER — METOPROLOL TARTRATE 5 MG/5ML IV SOLN
5.0000 mg | Freq: Once | INTRAVENOUS | Status: AC
  Administered 2018-02-26: 5 mg via INTRAVENOUS
  Filled 2018-02-26: qty 5

## 2018-02-26 MED ORDER — LIDOCAINE HCL 1 % IJ SOLN
INTRAMUSCULAR | Status: AC
  Filled 2018-02-26: qty 20

## 2018-02-26 NOTE — Progress Notes (Signed)
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 CT scan of the chest showed no evidence of distant metastatic disease. Bowel obstruction has not resolved X-ray abdomen showed persistent bowel obstruction from 02/25/2018 The patient has failed conservative management. I recommend n.p.o., continue IV fluids and placement of venting gastrostomy tube. After discussion with radiologist, I have requested NG tube to be placed to decompress her stomach.  The radiologist felt that would be helpful before placement of venting gastrostomy tube.  I have a long discussion with the patient and her husband.  She is critically ill.  Without systemic chemotherapy, it is unlikely that her bowel obstruction will resolve. However, giving her treatment at this point will also be dangerous. We discussed the risks, benefits, side effects of doxorubicin with ifosfamide and mesna versus single agent doxorubicin or ifosfamide. Some of the discussed side effects including risk of nausea, vomiting, mucositis, pancytopenia, potential risk of heart failure, renal failure, requirement for blood transfusion, sepsis and death Ultimately, she wants to proceed with palliative chemotherapy I recommend minimum 48 hours for symptomatic control before we proceed with chemotherapy in the hospital.  The nursing staff and inpatient pharmacist has been alerted for potential need for inpatient chemotherapy soon.  Hydronephrosiswith UTI She has complicated urinary tract infection, will continue IV antibiotics Port placement is placed on hold She had successful stent placement by urologist. Continue close monitoring of kidney function Recommend repeat urine culture.  If urine culture is negative, maybe we can stop IV antibiotics   Soon  Complete bowel obstruction She has no bowel movement despite enema She has signs and symptoms of complete bowel obstruction She is not a surgical candidate As above, plan for n.p.o., IV fluids, NG tube placement and eventual venting gastrostomy tube, hopefully to be accomplished before we start systemic chemotherapy  Anemia Thrombocytosis Likely related to recurrence of malignancy There is a component of iron deficiency She has received 1 dose of intravenous ironon 02/22/2018, will give her second dose of iron today  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Moderate protein calorie malnutrition Due to recent infection, I would like to hold off total parenteral nutrition for now  Goals of care She is aware that her cancer is incurable but is interested to try palliative treatment if possible I have a long discussion with the patient and her husband. The risk of chemotherapy is very high.  However, without chemotherapy, she would likely succumb to the disease very quickly without supportive care We discussed the importance of advanced directive and medical healthcare power of attorney I have recently completed the paperwork to certify her permanently disabled We discussed CODE STATUS and the patient wants to be full code  Severe cancer pain She has unreliable GI absorption I have discontinued her oral prescription pain medicine Onc PICC line is placed, I would recommend patient controlled analgesia I have placed palliative care consult for symptom management and assistance in severe cancer pain management She responded well to additional IV pain medicine at 8 am.   Poor venous access I recommend PICC line placement  Discharge planning Not ready for discharge Given significant deterioration of her disease, she would likely be here for several weeks  [name], MD 02/26/2018  10:25 AM   Overall, I spent almost 45 minutes in direct patient counseling  and almost 60 minutes in total  patient  care today   Subjective:  The patient is seen this morning.  Her husband was present.  At the initial time of evaluation, she was almost in tears due to severe, uncontrolled pain.  She had nausea and vomiting yesterday.  Her abdomen is grossly distended this morning and she continues to feel nauseated.  She had received her last dose of pain medicine intravenous given around 6 AM.  I have requested her nurse to give her another dose at around 8 AM. She feels weak overall.  Objective:  Vitals:   02/26/18 0257 02/26/18 0504  BP: (!) 148/103 (!) 140/96  Pulse: (!) 119 (!) 113  Resp:  15  Temp:  98.2 F (36.8 C)  SpO2: 100% 100%     Intake/Output Summary (Last 24 hours) at 02/26/2018 1025 Last data filed at 02/26/2018 0536 Gross per 24 hour  Intake 1190 ml  Output 793 ml  Net 397 ml    GENERAL:alert, in severe distress and appears uncomfortable SKIN: skin color, texture, turgor are normal, no rashes or significant lesions EYES: normal, Conjunctiva are pink and non-injected, sclera clear OROPHARYNX:no exudate, no erythema and lips, buccal mucosa, and tongue normal  NECK: supple, thyroid normal size, non-tender, without nodularity LYMPH:  no palpable lymphadenopathy in the cervical, axillary or inguinal LUNGS: clear to auscultation and percussion with normal breathing effort HEART: Tachycardia, no murmurs, mild bilateral edema, left greater than the right ABDOMEN:abdomen appears tense, grossly distended, discomfort on deep palpation over the left lower quadrant Musculoskeletal:no cyanosis of digits and no clubbing  NEURO: alert & oriented x 3 with fluent speech, no focal motor/sensory deficits   Labs:  Lab Results  Component Value Date   WBC 16.7 (H) 02/26/2018   HGB 9.7 (L) 02/26/2018   HCT 29.9 (L) 02/26/2018   MCV 78.1 02/26/2018   PLT 550 (H) 02/26/2018   NEUTROABS 11.1 (H) 02/22/2018    Lab Results  Component Value Date   NA 140 02/26/2018   K 3.8 02/26/2018   CL 113  (H) 02/26/2018   CO2 20 (L) 02/26/2018    Studies:  Dg Abd 1 View  Result Date: 02/25/2018 CLINICAL DATA:  Generalized abdominal pain and distension this morning. History of small-bowel obstruction. EXAM: ABDOMEN - 1 VIEW COMPARISON:  KUB of February 21, 2018 FINDINGS: There remain loops of moderately distended gas-filled small bowel which are more conspicuous today. There is contrast within normal caliber colon. There are bilateral ureteral stents. There surgical clips in the left aspect of the pelvis. IMPRESSION: Findings consistent with distal small bowel obstruction. The degree of bowel distention has increased to a mild degree. Moderate amount of contrast laden stool within the colon. Electronically Signed   By: David  Martinique M.D.   On: 02/25/2018 07:53   Dg C-arm 1-60 Min-no Report  Result Date: 02/16/2018 Fluoroscopy was utilized by the requesting physician.  No radiographic interpretation.   [place] Ekg Site Rite  Result Date: 02/26/2018 If Site Rite image not attached, placement could not be confirmed due to current cardiac rhythm.

## 2018-02-26 NOTE — Consult Note (Signed)
Consultation Note Date: 02/26/2018   Patient Name: Anita Whitehead  DOB: May 12, 1956  MRN: [id number]  Age / Sex: 62 y.o., female  PCP: [name], NP Referring Physician: Dessa Phi, DO  Reason for Consultation: Pain control  HPI/Patient Profile: 62 y.o. 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 CT shows progressing sarcoma resulting in SBO. Bilateral ureteral stents placed 02/04/2018. Undergoing treatment of Klebsiella UTI.   Clinical Assessment and Goals of Care: I met today with Ms. Grandville Silos along with her daughter (she is an Therapist, sports and in NP school). We were joined by Ms. Ludlam's husband. I focused my visit today on pain management. Ms. Apuzzo describes her pain as L > R flank pain deep throbbing. Denies any radiating or shooting pain - no signs of neuropathic component to pain from description. She does describe overall pain from pressure that has been relieved some since NGT placed today. Now pain 5/10 but pain quickly escalated during my visit. Pain improved with NGT and IV morphine. Not made worse by movement/activity.   Discussed plans for venting PEG and chemo (awaiting PICC placement). Briefly discussed nutrition with liquids and venting PEG as well as TPN option. Discussed plans for PCA with basal and bolus rate with goal to likely transition to fentanyl patch for longer term pain management as she pursues treatment. All questions/concerns addressed. Emotional support provided. Will follow up in the morning.   Primary Decision Maker PATIENT    SUMMARY OF RECOMMENDATIONS   - PCA to better control and assess pain needs with goal to transition to fentanyl  patch for long acting pain needs.  - If concerned with GI absorption may consider SL morphine for breakthrough pain eventually.   Code Status/Advance Care Planning:  Full code   Symptom Management:   Bowel Regimen: SBO, no recent BM, no response to enemas. Plan for chemo for treatment of cancer. NGT placed to decompress abd.   L > R flank pain r/t pelvic/retroperitoneal sarcoma:   Dilaudid PCA (doses calculated from morphine IV 44 mg IV over past 24 hr + hydrocodone 10 mg):   Loading dose: 0.5 mg x 1  Basal rate: 0.3 mg  Bolus dose: 0.3 mg every 20 min  D/C morphine.   Palliative Prophylaxis:   Delirium Protocol and Frequent Pain Assessment  Additional Recommendations (Limitations, Scope, Preferences):  Full Scope Treatment  Psycho-social/Spiritual:   Desire for further Chaplaincy support:yes  Additional Recommendations: Caregiving  Support/Resources  Prognosis:   Cancer is not curable. Prognosis based on her tolerance/response to treatment as well as functional status.   Discharge Planning: To Be Determined      Primary Diagnoses: Present on Admission: . Partial small bowel obstruction ([place]) . Cancer associated pain . 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([place]) . Retroperitoneal sarcoma ([place]) . Nausea with vomiting . Essential hypertension .  Dehydration . Dehydration with hyponatremia . Asymptomatic bacteriuria . Mononeuropathy of left lower limb . Left leg swelling   I have reviewed the medical record, interviewed the patient and family, and examined the patient. The following aspects are pertinent.  Past Medical History:  Diagnosis Date  . ALLERGIC RHINITIS 05/14/2007   Qualifier: Diagnosis of  By: Rogue Bussing CMA, Maryann Alar    . Allergy   . Cervical cancer ([place]) 1989  . History of colon polyps    TA   . Hyperlipidemia   . Hypertension   . Pelvic mass in female 05/31/2017  . PMS (premenstrual syndrome)    Social History   Socioeconomic History  . Marital  status: Married    Spouse name: [name]  . Number of children: 1  . Years of education: Not on file  . Highest education level: Not on file  Occupational History  . Occupation: Education officer, environmental  Social Needs  . Financial resource strain: Not on file  . Food insecurity:    Worry: Not on file    Inability: Not on file  . Transportation needs:    Medical: Not on file    Non-medical: Not on file  Tobacco Use  . Smoking status: Former Smoker    Last attempt to quit: 02/21/2015    Years since quitting: 3.0  . Smokeless tobacco: Never Used  . Tobacco comment: hooka-STOPPED 2016  Substance and Sexual Activity  . Alcohol use: Not Currently    Alcohol/week: 0.0 oz    Comment: 3 beers a week and 2 cocktails during the week- 1 drink a day. Last drink: About a month ago.   . Drug use: No  . Sexual activity: Yes    Birth control/protection: None    Comment: TAH  Lifestyle  . Physical activity:    Days per week: Not on file    Minutes per session: Not on file  . Stress: Not on file  Relationships  . Social connections:    Talks on phone: Not on file    Gets together: Not on file    Attends religious service: Not on file    Active member of club or organization: Not on file    Attends meetings of clubs or organizations: Not on file    Relationship status: Not on file  Other Topics Concern  . Not on file  Social History Narrative   Civil engineer, contracting for [employer]   Married for 16 years   2 boys, one in [place] and one in [place]   Family History  Problem Relation Age of Onset  . Diabetes Brother   . Seizures Brother   . Hypertension Mother   . Ovarian cancer Mother 62  . Hypertension Sister   . Breast cancer Sister 81  . Lung cancer Father 65  . Hypertension Sister   . Colon cancer Neg Hx   . Colon polyps Neg Hx   . Esophageal cancer Neg Hx   . Rectal cancer Neg Hx   . Stomach cancer Neg Hx    Scheduled Meds: . enoxaparin (LOVENOX) injection  40  mg Subcutaneous Q24H  . HYDROmorphone   Intravenous Q4H   Continuous Infusions: . sodium chloride 100 mL/hr at 02/26/18 1204  .  ceFAZolin (ANCEF) IV Stopped (02/26/18 1100)  . ondansetron (ZOFRAN) IV     PRN Meds:.diphenhydrAMINE **OR** [DISCONTINUED] diphenhydrAMINE, morphine injection, naloxone **AND** sodium chloride flush, [DISCONTINUED] ondansetron **OR** ondansetron (ZOFRAN) IV, prochlorperazine No Known Allergies Review of Systems  Constitutional: Positive for activity change and appetite change.  Respiratory: Negative for shortness of breath.   Gastrointestinal: Positive for abdominal distention, abdominal pain, constipation and nausea.  Neurological: Positive for weakness.    Physical Exam  Constitutional: She is oriented to person, place, and time. She appears well-developed.  HENT:  Head: Normocephalic and atraumatic.  Cardiovascular: Tachycardia present.  Pulmonary/Chest: Effort normal. No accessory muscle usage. No tachypnea. No respiratory distress.  Abdominal: She exhibits distension. There is no tenderness.  Neurological: She is alert and oriented to person, place, and time.  Skin:  Swelling L down leg into feet Swelling R down to knee (less than L)  Nursing note and vitals reviewed.   Vital Signs: BP (!) 140/96 (BP Location: Right Arm)   Pulse (!) 113   Temp 98.2 F (36.8 C) (Oral)   Resp 15   Ht _0  (1.6 m)   Wt 47.6 kg (104 lb 15 oz)   SpO2 100%   BMI 18.59 kg/m  Pain Scale: 0-10 POSS *See Group Information*: 1-Acceptable,Awake and alert Pain Score: 4    SpO2: SpO2: 100 % O2 Device:SpO2: 100 % O2 Flow Rate: .O2 Flow Rate (L/min): 10 L/min  IO: Intake/output summary:   Intake/Output Summary (Last 24 hours) at 02/26/2018 1554 Last data filed at 02/26/2018 1128 Gross per 24 hour  Intake 1190 ml  Output 1393 ml  Net -203 ml    LBM: Last BM Date: 02/19/2018 Baseline Weight: Weight: 48.5 kg (107 lb) Most recent weight: Weight: 47.6 kg (104 lb  15 oz)     Palliative Assessment/Data: 50%     Time Total: 60 min  Greater than 50%  of this time was spent counseling and coordinating care related to the above assessment and plan.  Signed by: [name], NP Palliative Medicine Team Pager # [phone] (M-F 8a-5p) Team Phone # [phone] (Nights/Weekends)

## 2018-02-26 NOTE — Progress Notes (Signed)
START OFF PATHWAY REGIMEN - [Other Dx]   [id number]:Doxorubicin (50) q21d:   A cycle is every 21 days:     Doxorubicin   **Always confirm dose/schedule in your pharmacy ordering system**  Administration Notes: doxorubicin and ifosfamide regimen  Patient Characteristics: Intent of Therapy: Non-Curative / Palliative Intent, Discussed with Patient

## 2018-02-26 NOTE — Progress Notes (Addendum)
PROGRESS NOTE    Anita Whitehead  FUX:[id number] DOB: 04-30-1956 DOA: 02/09/2018 PCP: [name], NP     Brief Narrative:  Anita Whitehead is a 62 year old female with pelvic and retroperitoneal sarcoma who was admitted for abdominal pain and nausea. KUB showed partial small bowel obstruction secondary to the tumor. General surgery was consulted for SBO management. The patient's bowel obstruction slowly improved and the patient's diet was advanced to full liquid. Hematology was consulted for management of the sarcoma and suggested that port placement and palliative chemotherapy were the next steps. The KUB also showed bilateral hydronephrosis and urology was consulted. Urine culture grew klebsiella pneumoniae. Patient was transferred from [facility] Medicine teaching service at [facility] to Hospitalist service at [facility] for ureteral stent placement. She underwent cystoscopy with bilateral ureteral stent placement Dr. Gloriann Loan 7/21.  New events last 24 hours / Subjective: She has had 3 enemas over the past 3 days without any BM. Complains of discomfort in her abdomen.   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[place]) Active Problems:   Essential hypertension   Cancer associated pain   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([facility])   Left leg swelling   Dehydration   Nausea with vomiting   Partial small bowel obstruction ([facility])   Dehydration with hyponatremia   Asymptomatic bacteriuria   Mononeuropathy of left lower limb   Sarcoma ([place])   Retroperitoneal sarcoma -Dr. Alvy Bimler following -PICC ordered to initiate chemo, possible later this week  -Pain control, likely need PCA pump. Dr. Alvy Bimler has consulted palliative care to assist with pain management   Partialsmall bowel obstruction --> progression to complete small bowel obstruction  -General surgery now signed off. Recommended continuing full liquid diet -KUB 7/22 revealed distal SBO with contrast laden  stool within colon  -No BM since admission despite enema x 3. Concern for progression to complete obstruction. Dr. Alvy Bimler has ordered venting gastrostomy tube, but due to anatomy, will need decompression with NGT first  -NPO   Bilateral hydronephrosis secondary to extrinsic compression from sarcoma  -Urology consulted -S/p cystoscopy with bilateral ureteral stent placement Dr. Gloriann Loan 7/21  -Stents can be exchanged every 3 months.  No further urological intervention necessary at this time  Klebsiella UTI -Rocephin 7/17 -Bactrim 7/19-7/20 -Ancef started 7/21  -Trend WBC   AKI -Baseline Cr 0.9 -Resolved with IVF    HTN -Hold coreg while NPO    DVT prophylaxis: Lovenox Code Status: Full Family Communication: No family at bedside  Disposition Plan: Will need NGT for bowel decompression, PICC for chemo. Possibly may need TPN depending on her improvement from infection stand point, bowel obstruction.    Consultants:   Oncology   Urology   General surgery - signed off   Procedures:   S/p cystoscopy with bilateral ureteral stent placement Dr. Gloriann Loan 7/21   Antimicrobials:  -Rocephin 7/17 -Bactrim 7/19-7/20 -Ancef started 7/21    Objective: Vitals:   02/26/18 0015 02/26/18 0124 02/26/18 0257 02/26/18 0504  BP: (!) 156/105 (!) 161/120 (!) 148/103 (!) 140/96  Pulse: (!) 103 (!) 111 (!) 119 (!) 113  Resp: 20   15  Temp: 97.6 F (36.4 C)   98.2 F (36.8 C)  TempSrc: Oral   Oral  SpO2: 100%  100% 100%  Weight:      Height:        Intake/Output Summary (Last 24 hours) at 02/26/2018 0931 Last data filed at 02/26/2018 0536 Gross per 24 hour  Intake  1190 ml  Output 793 ml  Net 397 ml   Filed Weights   02/17/2018 1121 02/10/2018 2300  Weight: 48.5 kg (107 lb) 47.6 kg (104 lb 15 oz)    Examination: General exam: Appears calm but uncomfortable  Respiratory system: Clear to auscultation. Respiratory effort normal. Cardiovascular system: S1 & S2 heard, tachycardic,  regular rhythm. +Trace pedal edema  Gastrointestinal system: Abdomen is distended, firm on left side abdomen. Non TTP. Bowel sounds heard over epigastric abdomen, no bowel sounds below  Central nervous system: Alert and oriented. No focal neurological deficits. Extremities: Symmetric 5 x 5 power. Skin: No rashes, lesions or ulcers Psychiatry: Judgement and insight appear normal. Mood & affect appropriate.    Data Reviewed: I have personally reviewed following labs and imaging studies  CBC: Recent Labs  Lab 02/21/18 0235 02/22/18 0625 02/17/2018 1230 02/25/18 0522 02/26/18 0517  WBC 14.2* 13.0* 17.5* 16.9* 16.7*  NEUTROABS  --  11.1*  --   --   --   HGB 9.7* 9.3* 10.9* 9.0* 9.7*  HCT 29.8* 31.2* 33.5* 27.9* 29.9*  MCV 78.2 83.2 78.3 77.5* 78.1  PLT 601* 508* 660* 575* 182*   Basic Metabolic Panel: Recent Labs  Lab 02/23/2018 1256 02/21/18 0235 02/04/2018 1230 02/25/18 0522 02/26/18 0517  NA 129* 129* 141 140 140  K 3.8 4.3 3.5 4.5 3.8  CL 92* 99 113* 113* 113*  CO2 24 20* 18* 17* 20*  GLUCOSE 98 94 93 107* 94  BUN 20 14 10 12 13   CREATININE 1.23* 1.01* 1.72* 1.30* 0.99  CALCIUM 8.9 8.3* 9.0 8.7* 8.7*   GFR: Estimated Creatinine Clearance: 46 mL/min (by C-G formula based on SCr of 0.99 mg/dL). Liver Function Tests: Recent Labs  Lab 03/03/2018 1256  AST 16  ALT 10  ALKPHOS 70  BILITOT 0.4  PROT 6.7  ALBUMIN 2.8*   Recent Labs  Lab 02/10/2018 1256  LIPASE 22   No results for input(s): AMMONIA in the last 168 hours. Coagulation Profile: No results for input(s): INR, PROTIME in the last 168 hours. Cardiac Enzymes: No results for input(s): CKTOTAL, CKMB, CKMBINDEX, TROPONINI in the last 168 hours. BNP (last 3 results) No results for input(s): PROBNP in the last 8760 hours. HbA1C: No results for input(s): HGBA1C in the last 72 hours. CBG: No results for input(s): GLUCAP in the last 168 hours. Lipid Profile: No results for input(s): CHOL, HDL, LDLCALC, TRIG,  CHOLHDL, LDLDIRECT in the last 72 hours. Thyroid Function Tests: No results for input(s): TSH, T4TOTAL, FREET4, T3FREE, THYROIDAB in the last 72 hours. Anemia Panel: No results for input(s): VITAMINB12, FOLATE, FERRITIN, TIBC, IRON, RETICCTPCT in the last 72 hours. Sepsis Labs: Recent Labs  Lab 02/04/2018 1214 02/07/2018 1333 02/21/18 0008 02/21/18 0235  LATICACIDVEN 1.91* 2.20* 1.2 1.2    Recent Results (from the past 240 hour(s))  Urine culture     Status: Abnormal   Collection Time: 02/19/2018  1:56 PM  Result Value Ref Range Status   Specimen Description URINE, RANDOM  Final   Special Requests   Final    NONE Performed at [facility], [place] [address] [place], [place] [address]    Culture >=100,000 COLONIES/mL KLEBSIELLA PNEUMONIAE (A)  Final   Report Status 02/22/2018 FINAL  Final   Organism ID, Bacteria KLEBSIELLA PNEUMONIAE (A)  Final      Susceptibility   Klebsiella pneumoniae - MIC*    AMPICILLIN RESISTANT Resistant     CEFAZOLIN <=4 SENSITIVE Sensitive  CEFTRIAXONE <=1 SENSITIVE Sensitive     CIPROFLOXACIN <=0.25 SENSITIVE Sensitive     GENTAMICIN <=1 SENSITIVE Sensitive     IMIPENEM <=0.25 SENSITIVE Sensitive     NITROFURANTOIN <=16 SENSITIVE Sensitive     TRIMETH/SULFA <=20 SENSITIVE Sensitive     AMPICILLIN/SULBACTAM 4 SENSITIVE Sensitive     PIP/TAZO <=4 SENSITIVE Sensitive     Extended ESBL NEGATIVE Sensitive     * >=100,000 COLONIES/mL KLEBSIELLA PNEUMONIAE       Radiology Studies: Dg Abd 1 View  Result Date: 02/25/2018 CLINICAL DATA:  Generalized abdominal pain and distension this morning. History of small-bowel obstruction. EXAM: ABDOMEN - 1 VIEW COMPARISON:  KUB of February 21, 2018 FINDINGS: There remain loops of moderately distended gas-filled small bowel which are more conspicuous today. There is contrast within normal caliber colon. There are bilateral ureteral stents. There surgical clips in the left aspect of the pelvis. IMPRESSION: Findings  consistent with distal small bowel obstruction. The degree of bowel distention has increased to a mild degree. Moderate amount of contrast laden stool within the colon. Electronically Signed   By: David  Martinique M.D.   On: 02/25/2018 07:53   Dg C-arm 1-60 Min-no Report  Result Date: 02/15/2018 Fluoroscopy was utilized by the requesting physician.  No radiographic interpretation.   [place] Ekg Site Rite  Result Date: 02/26/2018 If Site Rite image not attached, placement could not be confirmed due to current cardiac rhythm.     Scheduled Meds: . enoxaparin (LOVENOX) injection  40 mg Subcutaneous Q24H   Continuous Infusions: . sodium chloride 100 mL/hr at 02/25/18 0615  .  ceFAZolin (ANCEF) IV Stopped (02/26/18 0005)  . ferric gluconate (FERRLECIT/NULECIT) IV       LOS: 6 days    Time spent: 25 minutes   Dessa Phi, DO [facility] [email or web address] Password [facility] 02/26/2018, 9:31 AM

## 2018-02-26 NOTE — Procedures (Signed)
Successful placement of dual lumen PICC line to right brachial vein. Length 36 cm Tip at lower SVC/RA No complications Ready for use.   Ascencion Dike PA-C 02/26/2018 5:31 PM

## 2018-02-27 ENCOUNTER — Inpatient Hospital Stay (HOSPITAL_COMMUNITY): Payer: 59

## 2018-02-27 DIAGNOSIS — R609 Edema, unspecified: Secondary | ICD-10-CM

## 2018-02-27 LAB — CBC
HEMATOCRIT: 28.2 % — AB (ref 36.0–46.0)
Hemoglobin: 9.2 g/dL — ABNORMAL LOW (ref 12.0–15.0)
MCH: 25.5 pg — ABNORMAL LOW (ref 26.0–34.0)
MCHC: 32.6 g/dL (ref 30.0–36.0)
MCV: 78.1 fL (ref 78.0–100.0)
Platelets: 525 10*3/uL — ABNORMAL HIGH (ref 150–400)
RBC: 3.61 MIL/uL — ABNORMAL LOW (ref 3.87–5.11)
RDW: 20.7 % — ABNORMAL HIGH (ref 11.5–15.5)
WBC: 27.8 10*3/uL — ABNORMAL HIGH (ref 4.0–10.5)

## 2018-02-27 LAB — BASIC METABOLIC PANEL
Anion gap: 13 (ref 5–15)
BUN: 15 mg/dL (ref 6–20)
CO2: 16 mmol/L — ABNORMAL LOW (ref 22–32)
CREATININE: 0.86 mg/dL (ref 0.44–1.00)
Calcium: 8.7 mg/dL — ABNORMAL LOW (ref 8.9–10.3)
Chloride: 114 mmol/L — ABNORMAL HIGH (ref 98–111)
GFR calc Af Amer: >60 mL/min (ref >60)
GFR calc non Af Amer: >60 mL/min (ref >60)
Glucose, Bld: 78 mg/dL (ref 70–99)
Potassium: 3.9 mmol/L (ref 3.5–5.1)
Sodium: 143 mmol/L (ref 135–145)

## 2018-02-27 LAB — PROTIME-INR
INR: 1.16
Prothrombin Time: 14.7 seconds (ref 11.4–15.2)

## 2018-02-27 LAB — URINE CULTURE: Culture: NO GROWTH

## 2018-02-27 MED ORDER — CEFAZOLIN SODIUM-DEXTROSE 1-4 GM/50ML-% IV SOLN
1.0000 g | Freq: Three times a day (TID) | INTRAVENOUS | Status: DC
  Administered 2018-02-27 – 2018-03-04 (×15): 1 g via INTRAVENOUS
  Filled 2018-02-27 (×16): qty 50

## 2018-02-27 MED ORDER — ENOXAPARIN SODIUM 40 MG/0.4ML ~~LOC~~ SOLN
40.0000 mg | SUBCUTANEOUS | Status: DC
  Administered 2018-02-27: 40 mg via SUBCUTANEOUS

## 2018-02-27 MED ORDER — ENOXAPARIN SODIUM 40 MG/0.4ML ~~LOC~~ SOLN: 40.0000 mg | SUBCUTANEOUS | Status: DC

## 2018-02-27 MED ORDER — METOPROLOL TARTRATE 5 MG/5ML IV SOLN
5.0000 mg | Freq: Four times a day (QID) | INTRAVENOUS | Status: DC
  Administered 2018-02-27 – 2018-03-04 (×20): 5 mg via INTRAVENOUS
  Filled 2018-02-27 (×20): qty 5

## 2018-02-27 MED ORDER — HYDROMORPHONE 1 MG/ML IV SOLN: INTRAVENOUS | Status: DC

## 2018-02-27 MED ORDER — HYDROMORPHONE 1 MG/ML IV SOLN
INTRAVENOUS | Status: DC
  Administered 2018-02-27: 2.28 mg via INTRAVENOUS
  Administered 2018-02-27: 1.16 mg via INTRAVENOUS
  Administered 2018-02-27: 7 mg via INTRAVENOUS
  Administered 2018-02-28: 4.25 mg via INTRAVENOUS
  Administered 2018-02-28: 3.9 mg via INTRAVENOUS
  Filled 2018-02-27: qty 30

## 2018-02-27 NOTE — Progress Notes (Signed)
PHARMACY NOTE:  ANTIMICROBIAL RENAL DOSAGE ADJUSTMENT  Current antimicrobial regimen includes a mismatch between antimicrobial dosage and estimated renal function.  As per policy approved by the Pharmacy & Therapeutics and Medical Executive Committees, the antimicrobial dosage will be adjusted accordingly.  Current antimicrobial dosage:  Ancef 1g IV q12  Indication: kleb pneumo UTI  Renal Function:  Estimated Creatinine Clearance: 52.9 mL/min (by C-G formula based on SCr of 0.86 mg/dL). []      On intermittent HD, scheduled: []      On CRRT    Antimicrobial dosage has been changed to:  Ancef 1g IV q8  Additional comments:   Thank you for allowing pharmacy to be a part of this patient's care.  Kara Mead, [facility] 02/27/2018 8:19 AM

## 2018-02-27 NOTE — Progress Notes (Signed)
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   I am waiting for better pain control tomorrow and placement of venting tube before we will proceed with chemotherapy.  Most likely, she will start her chemotherapy on Friday I have asked nursing staff to provide chemo education in regards to side effects of doxorubicin, ifosfamide and mesna  Hydronephrosiswith UTI She has complicated urinary tract infection,will continue IV antibiotics Port placement is placed on hold She had successful stent placement by urologist. Continue close monitoring of kidney function  Complete bowel obstruction She has no bowel movement despite enema She has signs and symptoms of complete bowel obstruction She is not a surgical candidate As above, plan for n.p.o., IV fluids, NG tube placement and eventual venting gastrostomy tube, hopefully to be accomplished before we start systemic chemotherapy  Anemia Thrombocytosis Likely related to recurrence of malignancy There is a component of iron deficiency She has received 1 dose of intravenous ironon 02/22/2018 and second dose yesterday on 02/26/2018  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Moderate protein calorie malnutrition Due to recent infection, I would like to hold off total parenteral nutrition for now  Goals of care She is aware that her cancer is incurable but is interested to try palliative treatment if possible I have a long discussion with the patient and her husband. The risk of chemotherapy is very high.  However, without chemotherapy, she would likely succumb to the disease very quickly without supportive care We discussed the importance of advanced directive and medical healthcare power of attorney I have  recently completed the paperwork to certify her permanently disabled We discussed CODE STATUS and the patient wants to be full code  Severe cancer pain She has unreliable GI absorption Are appreciated assistant from palliative care consult for symptom management and assistance in severe cancer pain management   Discharge planning Not ready for discharge Given significant deterioration of her disease, she would likely be here for several weeks   Anita Lark, MD 02/27/2018  12:11 PM   Subjective:  She felt better since NG tube placement.  Her stomach has become less distended.  She continues to have no bowel movement for almost 7 days.  Her pain is well controlled with current prescription PCA Due to bilateral lower extremity edema, especially on the left side, she has limited mobility.  She denies nausea or vomiting since NG tube placement The patient denies any recent signs or symptoms of bleeding such as spontaneous epistaxis, hematuria or hematochezia.  Objective:  Vitals:   02/27/18 0740 02/27/18 1011  BP:  115/78  Pulse:  (!) 143  Resp: 13 18  Temp:  98 F (36.7 C)  SpO2: 99% 100%     Intake/Output Summary (Last 24 hours) at 02/27/2018 1211 Last data filed at 02/27/2018 2585 Gross per 24 hour  Intake 3979.34 ml  Output 1150 ml  Net 2829.34 ml    GENERAL:alert, no distress and comfortable SKIN: skin color, texture, turgor are normal, no rashes or significant lesions HEART: Noted bilateral lower extremity edema, left greater than right ABDOMEN:abdomen soft, distended, no bowel sounds Musculoskeletal:no cyanosis of digits and no clubbing  NEURO: alert & oriented x 3 with fluent speech, no focal motor/sensory deficits   Labs:  Lab Results  Component Value Date   WBC  27.8 (H) 02/27/2018   HGB 9.2 (L) 02/27/2018   HCT 28.2 (L) 02/27/2018   MCV 78.1 02/27/2018   PLT 525 (H) 02/27/2018   NEUTROABS 11.1 (H) 02/22/2018    Lab Results  Component Value Date   NA 143  02/27/2018   K 3.9 02/27/2018   CL 114 (H) 02/27/2018   CO2 16 (L) 02/27/2018    Studies:  Dg Abd Portable 1v  Result Date: 02/26/2018 CLINICAL DATA:  NG tube placement. EXAM: PORTABLE ABDOMEN - 1 VIEW COMPARISON:  02/25/2018. FINDINGS: NG tube noted in the stomach. No gastric distention. Bilateral ureteral stents noted. Distended loops of small large bowel noted suggesting adynamic ileus. Oral contrast in the colon. IMPRESSION: 1. NG tube noted coiled in the stomach. Bilateral double-J ureteral stents noted in good anatomic position. 2. Distended loops of small large bowel consistent adynamic ileus. Contrast noted in the colon. Electronically Signed   By: Marcello Moores  Register   On: 02/26/2018 12:34   [place] Ekg Site Rite  Result Date: 02/26/2018 If Site Rite image not attached, placement could not be confirmed due to current cardiac rhythm.
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
## 2018-02-27 NOTE — Progress Notes (Signed)
PROGRESS NOTE    IDALIE CANTO  WGY:[id number] DOB: 29-Nov-1955 DOA: 02/15/2018 PCP: [name], NP    Brief Narrative:  62 year old with past medical history relevant for remote history of cervical cancer status post radiation, hypertension, hyperlipidemia, known left-sided high-grade sarcoma who was admitted with partial small bowel obstruction to [facility] on 02/19/2018 and was noted to have mild AKI and bilateral left greater than right hydronephrosis.  She initially had mild improvement of her partial small bowel obstruction as she was not deemed a surgical candidate by general surgery and was tolerating clear liquids.  She was transferred to [facility] on 02/23/2018 and on 02/06/2018 received bilateral ureteral stents.  She is currently now n.p.o. due to complete bowel obstruction and has an NG tube and is pending a venting gastrostomy.   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 -Continue NG tube -Pending venting PEG tube - Continue IV fluids, n.p.o. (currently no interest in TPN) -Palliative care following appreciate recommendations, continue PCA and then per palliative will transition to fentanyl patch and if concerns about tolerating p.o. than buccally observed morphine -Oncology following appreciate recommendations, plan is for palliative chemotherapy with doxorubicin with ifosfamide and mesna -Patient continues to be full code and to agree with palliative chemotherapy  #) Bilateral hydronephrosis, gated by UTI: Status post  bilateral ureteral stents on 02/06/2018 -Urine culture from 02/27/2018 growing a Klebsiella that is pansensitive -Repeat urine culture from 02/26/2018 no growth to date -Urology consult, appreciate recommendations  #) Anemia: Likely related to malignancy and iron deficiency. -Status post a dose of IV iron on 02/22/2018 and 02/26/2018  #) Hypertension/tachycardia: Patient is quite tachycardic here today.  Her heart rate has been in the 120s to 140s.  It appears to be sinus.  She does not appear to be volume down.  She has no evidence of an obstructive process and it does not appear that the tumor is completely occluding the IVC.  She has no evidence of PE on her echo on 02/22/2018. -Patient is on carvedilol 12.5 mg twice daily at home, will start metoprolol tartrate's 5 mg every 6 hours here -Consider CTA for tachycardia  Fluids: Gentle IV fluids Elect lites: Monitor and supplement Nutrition: N.p.o.  Prophylaxis: Enoxaparin  Disposition: Pending clinical stability and palliative chemotherapy  Full code  Consultants:   Palliative care  Oncology  Urology  Interventional radiology  Procedures:   02/04/2018 status post bilateral ureteral stents  Antimicrobials:   Continue ceftezole and started 02/27/2018   Subjective: Patient reports she is feeling better with her pain under control.  She denies any nausea, vomiting, diarrhea.  She denies any bowel movements.  She has not had any flatus.  Objective: Vitals:   02/27/18 0342 02/27/18 0535 02/27/18 0740 02/27/18 1011  BP:  (!) 138/97  115/78  Pulse:  (!) 136  (!) 143  Resp: 11 16 13 18   Temp:  98.7 F (37.1 C)  98 F (36.7 C)  TempSrc:  Oral  Oral  SpO2: 99% 100% 99% 100%  Weight:  Height:        Intake/Output Summary (Last 24 hours) at 02/27/2018 1326 Last data filed at 02/27/2018 0558 Gross per 24 hour  Intake 3979.34 ml  Output 1150 ml  Net 2829.34 ml   Filed Weights   02/28/2018 1121 03/01/2018 2300  Weight: 48.5  kg (107 lb) 47.6 kg (104 lb 15 oz)    Examination:  General exam: Appears calm and comfortable  Respiratory system: Clear to auscultation. Respiratory effort normal. Cardiovascular system: Tachycardic, regular rhythm, no murmurs Gastrointestinal system: Distended, tender to palpation, no rebound or guarding, diminished bowel sounds Central nervous system: Alert and oriented. No focal neurological deficits. Extremities: Lower extremity edema Skin: Pick site is clean dry and intact Psychiatry: Judgement and insight appear normal. Mood & affect appropriate.     Data Reviewed: I have personally reviewed following labs and imaging studies  CBC: Recent Labs  Lab 02/22/18 0625 02/14/2018 1230 02/25/18 0522 02/26/18 0517 02/27/18 0631  WBC 13.0* 17.5* 16.9* 16.7* 27.8*  NEUTROABS 11.1*  --   --   --   --   HGB 9.3* 10.9* 9.0* 9.7* 9.2*  HCT 31.2* 33.5* 27.9* 29.9* 28.2*  MCV 83.2 78.3 77.5* 78.1 78.1  PLT 508* 660* 575* 550* 284*   Basic Metabolic Panel: Recent Labs  Lab 02/21/18 0235 03/03/2018 1230 02/25/18 0522 02/26/18 0517 02/27/18 0631  NA 129* 141 140 140 143  K 4.3 3.5 4.5 3.8 3.9  CL 99 113* 113* 113* 114*  CO2 20* 18* 17* 20* 16*  GLUCOSE 94 93 107* 94 78  BUN 14 10 12 13 15   CREATININE 1.01* 1.72* 1.30* 0.99 0.86  CALCIUM 8.3* 9.0 8.7* 8.7* 8.7*   GFR: Estimated Creatinine Clearance: 52.9 mL/min (by C-G formula based on SCr of 0.86 mg/dL). Liver Function Tests: No results for input(s): AST, ALT, ALKPHOS, BILITOT, PROT, ALBUMIN in the last 168 hours. No results for input(s): LIPASE, AMYLASE in the last 168 hours. No results for input(s): AMMONIA in the last 168 hours. Coagulation Profile: No results for input(s): INR, PROTIME in the last 168 hours. Cardiac Enzymes: No results for input(s): CKTOTAL, CKMB, CKMBINDEX, TROPONINI in the last 168 hours. BNP (last 3 results) No results for input(s): PROBNP in the last 8760 hours. HbA1C: No results for input(s):  HGBA1C in the last 72 hours. CBG: No results for input(s): GLUCAP in the last 168 hours. Lipid Profile: No results for input(s): CHOL, HDL, LDLCALC, TRIG, CHOLHDL, LDLDIRECT in the last 72 hours. Thyroid Function Tests: No results for input(s): TSH, T4TOTAL, FREET4, T3FREE, THYROIDAB in the last 72 hours. Anemia Panel: No results for input(s): VITAMINB12, FOLATE, FERRITIN, TIBC, IRON, RETICCTPCT in the last 72 hours. Sepsis Labs: Recent Labs  Lab 02/16/2018 1333 02/21/18 0008 02/21/18 0235  LATICACIDVEN 2.20* 1.2 1.2    Recent Results (from the past 240 hour(s))  Urine culture     Status: Abnormal   Collection Time: 02/08/2018  1:56 PM  Result Value Ref Range Status   Specimen Description URINE, RANDOM  Final   Special Requests   Final    NONE Performed at [facility], [place] [address] [place], [place] [address]    Culture >=100,000 COLONIES/mL KLEBSIELLA PNEUMONIAE (A)  Final   Report Status 02/22/2018 FINAL  Final   Organism ID, Bacteria KLEBSIELLA PNEUMONIAE (A)  Final      Susceptibility   Klebsiella pneumoniae - MIC*    AMPICILLIN RESISTANT Resistant     CEFAZOLIN <=4 SENSITIVE Sensitive  CEFTRIAXONE <=1 SENSITIVE Sensitive     CIPROFLOXACIN <=0.25 SENSITIVE Sensitive     GENTAMICIN <=1 SENSITIVE Sensitive     IMIPENEM <=0.25 SENSITIVE Sensitive     NITROFURANTOIN <=16 SENSITIVE Sensitive     TRIMETH/SULFA <=20 SENSITIVE Sensitive     AMPICILLIN/SULBACTAM 4 SENSITIVE Sensitive     PIP/TAZO <=4 SENSITIVE Sensitive     Extended ESBL NEGATIVE Sensitive     * >=100,000 COLONIES/mL KLEBSIELLA PNEUMONIAE  Culture, Urine     Status: None   Collection Time: 02/26/18  1:35 PM  Result Value Ref Range Status   Specimen Description   Final    URINE, CLEAN CATCH Performed at [facility] [address], [place], [place] [address]    Special Requests   Final    NONE Performed at [facility], [place] [address], [place], [place]  [address]    Culture   Final    NO GROWTH Performed at [facility], [place] [address] [place],  [address]    Report Status 02/27/2018 FINAL  Final         Radiology Studies: Dg Abd 1 View  Result Date: 02/27/2018 CLINICAL DATA:  Follow-up ileus. Current history of large sarcoma involving the LEFT side of the abdomen and pelvis extending into the proximal LEFT thigh. EXAM: ABDOMEN - 1 VIEW COMPARISON:  KUB 02/26/2018, 02/25/2018. CT abdomen and pelvis 02/09/2018. FINDINGS: Persistent moderately distended loops of small bowel, unchanged, displaced into the mid abdomen and RIGHT UPPER QUADRANT due to the large sarcoma as identified on the recent CT. Contrast material within the colon, though the distribution of the contrast has not significantly changed over the past 2 days, indicating adynamic ileus. BILATERAL double-J ureteral stents as noted previously, unchanged in position. Nasogastric tube with its tip in the body of the stomach. IMPRESSION: 1. Stable ileus. 2. Ileus is confirmed by the fact that the contrast material in the colon has not significantly progressed over the past 2 days, indicating an adynamic state. 3. Nasogastric tube tip in the body of the stomach. Electronically Signed   By: Evangeline Dakin M.D.   On: 02/27/2018 12:37   Dg Abd Portable 1v  Result Date: 02/26/2018 CLINICAL DATA:  NG tube placement. EXAM: PORTABLE ABDOMEN - 1 VIEW COMPARISON:  02/25/2018. FINDINGS: NG tube noted in the stomach. No gastric distention. Bilateral ureteral stents noted. Distended loops of small large bowel noted suggesting adynamic ileus. Oral contrast in the colon. IMPRESSION: 1. NG tube noted coiled in the stomach. Bilateral double-J ureteral stents noted in good anatomic position. 2. Distended loops of small large bowel consistent adynamic ileus. Contrast noted in the colon. Electronically Signed   By: Marcello Moores  Register   On: 02/26/2018 12:34   Ir Picc Placement Right >5 Yrs Inc Img  Guide  Result Date: 02/27/2018 INDICATION: Poor venous access.  Request PICC line placement for IV access. EXAM: RIGHT UPPER EXTREMITY PICC LINE PLACEMENT WITH ULTRASOUND AND FLUOROSCOPIC GUIDANCE MEDICATIONS: None; ANESTHESIA/SEDATION: Moderate Sedation Time:  None The patient was continuously monitored during the procedure by the interventional radiology nurse under my direct supervision. FLUOROSCOPY TIME:  Fluoroscopy Time: 18 seconds COMPLICATIONS: None immediate. PROCEDURE: The patient was advised of the possible risks and complications and agreed to undergo the procedure. The patient was then brought to the angiographic suite for the procedure. The right arm was prepped with chlorhexidine, draped in the usual sterile fashion using maximum barrier technique (cap and mask, sterile gown, sterile gloves,  large sterile sheet, hand hygiene and cutaneous antiseptic). Local anesthesia was attained by infiltration with 1% lidocaine. Ultrasound demonstrated patency of the brachial vein, and this was documented with an image. Under real-time ultrasound guidance, this vein was accessed with a 21 gauge micropuncture needle and image documentation was performed. The needle was exchanged over a guidewire for a peel-away sheath through which a 36 cm 5 [place] dual lumen power injectable PICC was advanced, and positioned with its tip at the lower SVC/right atrial junction. Fluoroscopy during the procedure and fluoro spot radiograph confirms appropriate catheter position. The catheter was flushed, secured to the skin, and covered with a sterile dressing. IMPRESSION: Successful placement of a right arm PICC with sonographic and fluoroscopic guidance. The catheter is ready for use. Read by: Ascencion Dike PA-C Electronically Signed   By: Jerilynn Mages.  Shick M.D.   On: 02/26/2018 17:32   [place] Ekg Site Rite  Result Date: 02/26/2018 If Site Rite image not attached, placement could not be confirmed due to current cardiac  rhythm.       Scheduled Meds: . enoxaparin (LOVENOX) injection  40 mg Subcutaneous Q24H  . HYDROmorphone   Intravenous Q4H  . metoprolol tartrate  5 mg Intravenous Q6H   Continuous Infusions: . sodium chloride 100 mL/hr at 02/27/18 1145  .  ceFAZolin (ANCEF) IV Stopped (02/27/18 1145)  . ondansetron (ZOFRAN) IV       LOS: 7 days    Time spent: [place], MD [facility]  If 7PM-7AM, please contact night-coverage [email or web address] Password [email or web address] 02/27/2018, 1:26 PM

## 2018-02-27 NOTE — Progress Notes (Signed)
  Patient on our list for venting G-tube, however WBC is 27 today.  Will hold off until WBC has normalized.  Dailah Opperman S Phyliss Hulick PA-C 02/27/2018 12:05 PM

## 2018-02-28 ENCOUNTER — Inpatient Hospital Stay (HOSPITAL_COMMUNITY): Payer: 59

## 2018-02-28 ENCOUNTER — Encounter (HOSPITAL_COMMUNITY): Payer: Self-pay | Admitting: Interventional Radiology

## 2018-02-28 HISTORY — PX: IR GASTROSTOMY TUBE MOD SED: IMG625

## 2018-02-28 LAB — CBC
HCT: 25.9 % — ABNORMAL LOW (ref 36.0–46.0)
Hemoglobin: 8.5 g/dL — ABNORMAL LOW (ref 12.0–15.0)
MCH: 25.7 pg — ABNORMAL LOW (ref 26.0–34.0)
MCHC: 32.8 g/dL (ref 30.0–36.0)
MCV: 78.2 fL (ref 78.0–100.0)
Platelets: 458 10*3/uL — ABNORMAL HIGH (ref 150–400)
RBC: 3.31 MIL/uL — ABNORMAL LOW (ref 3.87–5.11)
RDW: 20.8 % — ABNORMAL HIGH (ref 11.5–15.5)
WBC: 27.6 K/uL — ABNORMAL HIGH (ref 4.0–10.5)

## 2018-02-28 LAB — BASIC METABOLIC PANEL
Anion gap: 11 (ref 5–15)
BUN: 17 mg/dL (ref 6–20)
CO2: 18 mmol/L — ABNORMAL LOW (ref 22–32)
Calcium: 8.7 mg/dL — ABNORMAL LOW (ref 8.9–10.3)
GFR calc non Af Amer: >60 mL/min (ref >60)
Glucose, Bld: 88 mg/dL (ref 70–99)
Sodium: 143 mmol/L (ref 135–145)

## 2018-02-28 LAB — BASIC METABOLIC PANEL WITH GFR
Chloride: 114 mmol/L — ABNORMAL HIGH (ref 98–111)
Creatinine, Ser: 0.96 mg/dL (ref 0.44–1.00)
GFR calc Af Amer: >60 mL/min (ref >60)
Potassium: 3.9 mmol/L (ref 3.5–5.1)

## 2018-02-28 LAB — MAGNESIUM: Magnesium: 1.9 mg/dL (ref 1.7–2.4)

## 2018-02-28 MED ORDER — LIDOCAINE HCL 1 % IJ SOLN
INTRAMUSCULAR | Status: AC
  Filled 2018-02-28: qty 20

## 2018-02-28 MED ORDER — NALOXONE HCL 0.4 MG/ML IJ SOLN
INTRAMUSCULAR | Status: AC
  Filled 2018-02-28: qty 1

## 2018-02-28 MED ORDER — IOPAMIDOL (ISOVUE-300) INJECTION 61%
50.0000 mL | Freq: Once | INTRAVENOUS | Status: AC | PRN
  Administered 2018-02-28: 10 mL

## 2018-02-28 MED ORDER — IOPAMIDOL (ISOVUE-300) INJECTION 61%: 50.0000 mL | Freq: Once | INTRAVENOUS | Status: DC | PRN

## 2018-02-28 MED ORDER — HYDROMORPHONE 1 MG/ML IV SOLN
INTRAVENOUS | Status: DC
  Administered 2018-02-28: 2.16 mg via INTRAVENOUS

## 2018-02-28 MED ORDER — FENTANYL CITRATE (PF) 100 MCG/2ML IJ SOLN
INTRAMUSCULAR | Status: AC
  Filled 2018-02-28: qty 4

## 2018-02-28 MED ORDER — LIDOCAINE HCL 1 % IJ SOLN
INTRAMUSCULAR | Status: AC | PRN
  Administered 2018-02-28: 10 mL

## 2018-02-28 MED ORDER — IOPAMIDOL (ISOVUE-300) INJECTION 61%
INTRAVENOUS | Status: AC
  Administered 2018-02-28: 10 mL
  Filled 2018-02-28: qty 50

## 2018-02-28 MED ORDER — FLUMAZENIL 0.5 MG/5ML IV SOLN
INTRAVENOUS | Status: AC
  Filled 2018-02-28: qty 5

## 2018-02-28 MED ORDER — MIDAZOLAM HCL 2 MG/2ML IJ SOLN
INTRAMUSCULAR | Status: AC
  Filled 2018-02-28: qty 4

## 2018-02-28 MED ORDER — HYDROMORPHONE 1 MG/ML IV SOLN
INTRAVENOUS | Status: DC
  Administered 2018-02-28: 5.16 mg via INTRAVENOUS
  Administered 2018-02-28: 9.2 mg via INTRAVENOUS
  Administered 2018-03-01: 2.48 mg via INTRAVENOUS
  Administered 2018-03-01: 30 mg via INTRAVENOUS
  Administered 2018-03-01: 4.93 mg via INTRAVENOUS
  Administered 2018-03-01: 5.61 mg via INTRAVENOUS
  Administered 2018-03-01: 3.35 mg via INTRAVENOUS
  Administered 2018-03-01: 4.5 mg via INTRAVENOUS
  Administered 2018-03-01: 0 mg via INTRAVENOUS
  Administered 2018-03-01: 3.51 mg via INTRAVENOUS
  Administered 2018-03-02: 05:00:00 via INTRAVENOUS
  Administered 2018-03-02: 3.02 mg via INTRAVENOUS
  Administered 2018-03-02: 1.63 mg via INTRAVENOUS
  Administered 2018-03-02: 3.43 mg via INTRAVENOUS
  Filled 2018-02-28: qty 25
  Filled 2018-02-28: qty 30

## 2018-02-28 MED ORDER — MIDAZOLAM HCL 2 MG/2ML IJ SOLN
INTRAMUSCULAR | Status: AC | PRN
  Administered 2018-02-28 (×3): 1 mg via INTRAVENOUS

## 2018-02-28 MED ORDER — GLUCAGON HCL RDNA (DIAGNOSTIC) 1 MG IJ SOLR
INTRAMUSCULAR | Status: AC
  Administered 2018-02-28: 1 mg
  Filled 2018-02-28: qty 1

## 2018-02-28 MED ORDER — SODIUM CHLORIDE 0.9% FLUSH: 9.0000 mL | INTRAVENOUS | Status: DC | PRN

## 2018-02-28 MED ORDER — VITAMINS A & D EX OINT
TOPICAL_OINTMENT | CUTANEOUS | Status: AC
  Filled 2018-02-28: qty 5

## 2018-02-28 MED ORDER — HYDROMORPHONE HCL 1 MG/ML IJ SOLN
INTRAMUSCULAR | Status: AC | PRN
  Administered 2018-02-28 (×2): 1 mg via INTRAVENOUS

## 2018-02-28 MED ORDER — DIPHENHYDRAMINE HCL 12.5 MG/5ML PO ELIX: 12.5000 mg | ORAL_SOLUTION | Freq: Four times a day (QID) | ORAL | Status: DC | PRN

## 2018-02-28 MED ORDER — NALOXONE HCL 0.4 MG/ML IJ SOLN: 0.4000 mg | INTRAMUSCULAR | Status: DC | PRN

## 2018-02-28 MED ORDER — DIPHENHYDRAMINE HCL 50 MG/ML IJ SOLN: 12.5000 mg | Freq: Four times a day (QID) | INTRAMUSCULAR | Status: DC | PRN

## 2018-02-28 MED ORDER — ENOXAPARIN SODIUM 40 MG/0.4ML ~~LOC~~ SOLN
40.0000 mg | SUBCUTANEOUS | Status: DC
  Administered 2018-03-01 – 2018-03-03 (×3): 40 mg via SUBCUTANEOUS
  Filled 2018-02-28 (×3): qty 0.4

## 2018-02-28 NOTE — Progress Notes (Signed)
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 I have spoken with radiologist importance of placement of venting tube.  Her leukocytosis is  due to untreated cancer and will not go away  Hydronephrosiswith UTI She has complicated urinary tract infection,will continue IV antibiotics Port placement is placed on hold She had successful stent placement by urologist. Continue close monitoring of kidney function  Completebowel obstruction She has no bowel movement despite enema She has signs and symptoms of complete bowel obstruction She is not a surgical candidate As above, plan for n.p.o., IV fluids, NG tube placement and eventual venting gastrostomy tube, hopefully to be accomplished before we start systemic chemotherapy I have discussed with radiologist today to proceed with venting tube placement despite leukocytosis I do not plan to start her on TPN until she completes chemotherapy  Anemia Thrombocytosis Likely related to recurrence of malignancy There is a component of iron deficiency She has received 1 dose of intravenous ironon 02/22/2018 and second dose yesterday on 02/26/2018  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Moderate protein calorie malnutrition Due to recent infection, I would like to hold off total parenteral nutrition for now  Goals of care She is aware that her cancer is incurable but is interested to try palliative treatment if possible I have a long discussion with the patient and her husband. The risk of chemotherapy is very high. However, without chemotherapy, she would likely succumb to the disease very quickly without supportive care We  discussed the importance of advanced directive and medical healthcare power of attorney I have recently completed the paperwork to certify her permanently disabled We discussed CODE STATUS and the patient wants to be full code  Severe cancer pain, improved on PCA She has unreliable GI absorption Are appreciated assistant from palliative care consult for symptom management and assistance in severe cancer pain management  Discharge planning Not ready for discharge Given significant deterioration of her disease, she would likely be here for several weeks    Anita Lark, MD 02/28/2018  8:05 AM   Subjective:  She felt better today.  Pain is under good control.  She feels hungry.  Her nausea and abdominal distention has improved.  Objective:  Vitals:   02/28/18 0249 02/28/18 0521  BP:  (!) 130/104  Pulse:  (!) 120  Resp: 14 10  Temp:  97.9 F (36.6 C)  SpO2: 99% (!) 78%     Intake/Output Summary (Last 24 hours) at 02/28/2018 0805 Last data filed at 02/27/2018 1500 Gross per 24 hour  Intake 1295 ml  Output 700 ml  Net 595 ml    GENERAL:alert, no distress and comfortable.  She has NG in situ SKIN: skin color, texture, turgor are normal, no rashes or significant lesions EYES: normal, Conjunctiva are pink and non-injected, sclera clear OROPHARYNX:no exudate, no erythema and lips, buccal mucosa, and tongue normal  NECK: supple, thyroid normal size, non-tender, without nodularity LYMPH:  no palpable lymphadenopathy in the cervical, axillary or inguinal LUNGS: clear to auscultation and percussion with normal breathing effort HEART: regular rate & rhythm and no murmurs and no lower extremity edema ABDOMEN:abdomen soft, persistent distention.  No bowel sounds Musculoskeletal:no cyanosis of digits and no clubbing  NEURO: alert & oriented x  3 with fluent speech, no focal motor/sensory deficits   Labs:  Lab Results  Component Value Date   WBC 27.6 (H) 02/28/2018   HGB 8.5 (L)  02/28/2018   HCT 25.9 (L) 02/28/2018   MCV 78.2 02/28/2018   PLT 458 (H) 02/28/2018   NEUTROABS 11.1 (H) 02/22/2018    Lab Results  Component Value Date   NA 143 02/28/2018   K 3.9 02/28/2018   CL 114 (H) 02/28/2018   CO2 18 (L) 02/28/2018    Studies:  Dg Abd 1 View  Result Date: 02/27/2018 CLINICAL DATA:  Follow-up ileus. Current history of large sarcoma involving the LEFT side of the abdomen and pelvis extending into the proximal LEFT thigh. EXAM: ABDOMEN - 1 VIEW COMPARISON:  KUB 02/26/2018, 02/25/2018. CT abdomen and pelvis 02/26/2018. FINDINGS: Persistent moderately distended loops of small bowel, unchanged, displaced into the mid abdomen and RIGHT UPPER QUADRANT due to the large sarcoma as identified on the recent CT. Contrast material within the colon, though the distribution of the contrast has not significantly changed over the past 2 days, indicating adynamic ileus. BILATERAL double-J ureteral stents as noted previously, unchanged in position. Nasogastric tube with its tip in the body of the stomach. IMPRESSION: 1. Stable ileus. 2. Ileus is confirmed by the fact that the contrast material in the colon has not significantly progressed over the past 2 days, indicating an adynamic state. 3. Nasogastric tube tip in the body of the stomach. Electronically Signed   By: Evangeline Dakin M.D.   On: 02/27/2018 12:37   Dg Abd Portable 1v  Result Date: 02/26/2018 CLINICAL DATA:  NG tube placement. EXAM: PORTABLE ABDOMEN - 1 VIEW COMPARISON:  02/25/2018. FINDINGS: NG tube noted in the stomach. No gastric distention. Bilateral ureteral stents noted. Distended loops of small large bowel noted suggesting adynamic ileus. Oral contrast in the colon. IMPRESSION: 1. NG tube noted coiled in the stomach. Bilateral double-J ureteral stents noted in good anatomic position. 2. Distended loops of small large bowel consistent adynamic ileus. Contrast noted in the colon. Electronically Signed   By: Marcello Moores   Register   On: 02/26/2018 12:34   Ir Picc Placement Right >5 Yrs Inc Img Guide  Result Date: 02/27/2018 INDICATION: Poor venous access.  Request PICC line placement for IV access. EXAM: RIGHT UPPER EXTREMITY PICC LINE PLACEMENT WITH ULTRASOUND AND FLUOROSCOPIC GUIDANCE MEDICATIONS: None; ANESTHESIA/SEDATION: Moderate Sedation Time:  None The patient was continuously monitored during the procedure by the interventional radiology nurse under my direct supervision. FLUOROSCOPY TIME:  Fluoroscopy Time: 18 seconds COMPLICATIONS: None immediate. PROCEDURE: The patient was advised of the possible risks and complications and agreed to undergo the procedure. The patient was then brought to the angiographic suite for the procedure. The right arm was prepped with chlorhexidine, draped in the usual sterile fashion using maximum barrier technique (cap and mask, sterile gown, sterile gloves, large sterile sheet, hand hygiene and cutaneous antiseptic). Local anesthesia was attained by infiltration with 1% lidocaine. Ultrasound demonstrated patency of the brachial vein, and this was documented with an image. Under real-time ultrasound guidance, this vein was accessed with a 21 gauge micropuncture needle and image documentation was performed. The needle was exchanged over a guidewire for a peel-away sheath through which a 36 cm 5 [place] dual lumen power injectable PICC was advanced, and positioned with its tip at the lower SVC/right atrial junction. Fluoroscopy during the procedure and fluoro spot radiograph confirms appropriate catheter position. The catheter was flushed, secured to the skin,  and covered with a sterile dressing. IMPRESSION: Successful placement of a right arm PICC with sonographic and fluoroscopic guidance. The catheter is ready for use. Read by: Ascencion Dike PA-C Electronically Signed   By: Jerilynn Mages.  Shick M.D.   On: 02/26/2018 17:32   [place] Ekg Site Rite  Result Date: 02/26/2018 If Site Rite image not attached,  placement could not be confirmed due to current cardiac rhythm.

## 2018-02-28 NOTE — Progress Notes (Signed)
Patient, husband, daughter over FaceTime and daughter in laws were educated on Adriamycin, Ifosfamide, and Mesna.  Patient and family was educated on the side effects of receiving the chemotherapy and what could be expected during her nadir.  Patient and family all verbalized understanding and asked appropriate questions that were answered.  Patient and family encouraged to write down questions and ask if they come up.  Will continue to monitor.
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
## 2018-02-28 NOTE — Consult Note (Signed)
Chief Complaint: Patient was seen in consultation today for small bowel obstruction.  Referring Physician(s): [name]  Supervising Physician: Sandi Mariscal  Patient Status: [facility] - In-pt  History of Present Illness: Anita Whitehead is a 62 y.o. female with a past medical history of hypertension, hyperlipidemia, allergic rhinitis, colon polyps, cervical cancer, and PMS. She was admitted for a small bowel obstruction.  IR requested by Dr. Alvy Bimler for possible image-guided percutaneous gastrostomy tube placement for venting purposes. Patient awake and alert sitting in chair with no complaints at this time. Accompanied by multiple family members. Denies fever, chills, chest pain, dyspnea, abdominal pain, dizziness, or headache.   Past Medical History:  Diagnosis Date  . ALLERGIC RHINITIS 05/14/2007   Qualifier: Diagnosis of  By: Rogue Bussing CMA, Maryann Alar    . Allergy   . Cervical cancer ([place]) 1989  . History of colon polyps    TA   . Hyperlipidemia   . Hypertension   . Pelvic mass in female 05/31/2017  . PMS (premenstrual syndrome)     Past Surgical History:  Procedure Laterality Date  . ABDOMINAL HYSTERECTOMY    . adenomatous colon polyps    . COLONOSCOPY     04-01-2009,2006 with [facility] [place]   . CYSTOSCOPY WITH STENT PLACEMENT Bilateral 02/11/2018   Procedure: CYSTOSCOPY, BILATERAL RETROGRADE PYELOGRAM, WITH BILATERAL URETERAL STENT PLACEMENT;  Surgeon: Lucas Mallow, MD;  Location: [facility]S;  Service: Urology;  Laterality: Bilateral;  . POLYPECTOMY    . RESECTION OF ABDOMINAL MASS N/A 11/13/2017   Surgery at [facility] 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 URETERAL REIMPLANTION Left 4/919   Surgeon Tresa Endo at [phone]. Left ureteral reimplantation with Boari flap; 2. Left 8 x 26 cm ureteral stent placement; 3.  Cystotomy closure    Allergies: Patient has no known allergies.  Medications: Prior to Admission medications   Medication Sig Start Date End Date Taking? Authorizing Provider  carvedilol (COREG) 12.5 MG tablet Take 12.5 mg by mouth 2 (two) times daily. 11/24/17  Yes [provider]  cyclobenzaprine (FLEXERIL) 10 MG tablet Take 1 tablet (10 mg total) by mouth 3 (three) times daily as needed for muscle spasms. 12/21/17  Yes Gorsuch, Ni, MD  docusate sodium (COLACE) 100 MG capsule Take 2 capsules (200 mg total) by mouth 2 (two) times daily. Patient taking differently: Take 200 mg by mouth daily as needed.  01/04/18  Yes Gorsuch, Ni, MD  feeding supplement, ENSURE ENLIVE, (ENSURE ENLIVE) LIQD Take 237 mLs by mouth 2 (two) times daily between meals. 06/03/17  Yes Velvet Bathe, MD  ferrous sulfate 325 (65 FE) MG EC tablet Take 325 mg by mouth daily. 01/23/18  Yes [provider]  gabapentin (NEURONTIN) 300 MG capsule Take 1 capsule (300 mg total) by mouth 3 (three) times daily. Patient taking differently: Take 300 mg by mouth at bedtime.  07/05/17  Yes Gorsuch, Ni, MD  HYDROcodone-acetaminophen (NORCO) 10-325 MG tablet Take 1 tablet by mouth every 6 (six) hours as needed. 01/28/18  Yes Gorsuch, Ni, MD  megestrol (MEGACE ES) 625 MG/5ML suspension Take 5 mLs by mouth daily. 01/23/18  Yes [provider]  morphine (MS CONTIN) 15 MG 12 hr tablet Take 1 tablet (15 mg total) by mouth every 12 (twelve) hours. Patient taking differently: Take 15 mg by mouth as needed.  12/21/17  Yes Gorsuch, Ni, MD  ondansetron (ZOFRAN) 8 MG tablet Take 1 tablet (8 mg total) by mouth every 8 (eight) hours as needed for nausea. 10/19/17  Yes [name], MD  prochlorperazine (COMPAZINE) 10 MG tablet Take 1 tablet (10 mg total) by mouth every 6 (six) hours as needed for nausea or vomiting. 10/19/17  Yes [name], MD  senna (SENOKOT) 8.6 MG TABS tablet Take 2 tablets (17.2 mg total) by mouth 2 (two) times  daily. 01/04/18  Yes Gorsuch, Ernst Spell, MD  Vitamin D, Ergocalciferol, (DRISDOL) 50000 units CAPS capsule Take 50,000 Units by mouth once a week. 02/19/18 03/21/18 Yes [provider]  simvastatin (ZOCOR) 10 MG tablet Take 1 tablet (10 mg total) by mouth at bedtime. Patient not taking: Reported on 02/27/2018 07/19/17   [name], NP     Family History  Problem Relation Age of Onset  . Diabetes Brother   . Seizures Brother   . Hypertension Mother   . Ovarian cancer Mother 61  . Hypertension Sister   . Breast cancer Sister 104  . Lung cancer Father 34  . Hypertension Sister   . Colon cancer Neg Hx   . Colon polyps Neg Hx   . Esophageal cancer Neg Hx   . Rectal cancer Neg Hx   . Stomach cancer Neg Hx     Social History   Socioeconomic History  . Marital status: Married    Spouse name: [name]  . Number of children: 1  . Years of education: Not on file  . Highest education level: Not on file  Occupational History  . Occupation: Education officer, environmental  Social Needs  . Financial resource strain: Not on file  . Food insecurity:    Worry: Not on file    Inability: Not on file  . Transportation needs:    Medical: Not on file    Non-medical: Not on file  Tobacco Use  . Smoking status: Former Smoker    Last attempt to quit: 02/21/2015    Years since quitting: 3.0  . Smokeless tobacco: Never Used  . Tobacco comment: hooka-STOPPED 2016  Substance and Sexual Activity  . Alcohol use: Not Currently    Alcohol/week: 0.0 oz    Comment: 3 beers a week and 2 cocktails during the week- 1 drink a day. Last drink: About a month ago.   . Drug use: No  . Sexual activity: Yes    Birth control/protection: None    Comment: TAH  Lifestyle  . Physical activity:    Days per week: Not on file    Minutes per session: Not on file  . Stress: Not on file  Relationships  . Social connections:    Talks on phone: Not on file    Gets together: Not on file    Attends religious service: Not on file     Active member of club or organization: Not on file    Attends meetings of clubs or organizations: Not on file    Relationship status: Not on file  Other Topics Concern  . Not on file  Social History Narrative   Civil engineer, contracting for [employer]   Married for 16 years   2 boys, one in [place] and one in [place]: A 12 point ROS discussed and pertinent positives are indicated in the HPI above.  All other systems are negative.  Review of Systems  Constitutional: Negative for chills and fever.  Respiratory: Negative  for shortness of breath and wheezing.   Cardiovascular: Negative for chest pain and palpitations.  Gastrointestinal: Negative for abdominal pain.  Neurological: Negative for dizziness and headaches.  Psychiatric/Behavioral: Negative for behavioral problems and confusion.    Vital Signs: BP (!) 130/104 (BP Location: Left Arm)   Pulse (!) 120   Temp 97.9 F (36.6 C) (Oral)   Resp 17   Ht 5\' 3"  (1.6 m)   Wt 104 lb 15 oz (47.6 kg)   SpO2 100%   BMI 18.59 kg/m   Physical Exam  Constitutional: She is oriented to person, place, and time. She appears well-developed and well-nourished. No distress.  Cardiovascular: Regular rhythm and normal heart sounds.  No murmur heard. Tachycardic.  Pulmonary/Chest: Effort normal and breath sounds normal. No respiratory distress. She has no wheezes.  Neurological: She is alert and oriented to person, place, and time.  Skin: Skin is warm and dry.  Psychiatric: She has a normal mood and affect. Her behavior is normal. Judgment and thought content normal.  Nursing note and vitals reviewed.    MD Evaluation Airway: WNL Heart: WNL Abdomen: WNL Chest/ Lungs: WNL ASA  Classification: 3 Mallampati/Airway Score: Two   Imaging: Dg Abd 1 View  Result Date: 02/27/2018 CLINICAL DATA:  Follow-up ileus. Current history of large sarcoma involving the LEFT side of the abdomen and pelvis  extending into the proximal LEFT thigh. EXAM: ABDOMEN - 1 VIEW COMPARISON:  KUB 02/26/2018, 02/25/2018. CT abdomen and pelvis 02/09/2018. FINDINGS: Persistent moderately distended loops of small bowel, unchanged, displaced into the mid abdomen and RIGHT UPPER QUADRANT due to the large sarcoma as identified on the recent CT. Contrast material within the colon, though the distribution of the contrast has not significantly changed over the past 2 days, indicating adynamic ileus. BILATERAL double-J ureteral stents as noted previously, unchanged in position. Nasogastric tube with its tip in the body of the stomach. IMPRESSION: 1. Stable ileus. 2. Ileus is confirmed by the fact that the contrast material in the colon has not significantly progressed over the past 2 days, indicating an adynamic state. 3. Nasogastric tube tip in the body of the stomach. Electronically Signed   By: Evangeline Dakin M.D.   On: 02/27/2018 12:37   Dg Abd 1 View  Result Date: 02/25/2018 CLINICAL DATA:  Generalized abdominal pain and distension this morning. History of small-bowel obstruction. EXAM: ABDOMEN - 1 VIEW COMPARISON:  KUB of February 21, 2018 FINDINGS: There remain loops of moderately distended gas-filled small bowel which are more conspicuous today. There is contrast within normal caliber colon. There are bilateral ureteral stents. There surgical clips in the left aspect of the pelvis. IMPRESSION: Findings consistent with distal small bowel obstruction. The degree of bowel distention has increased to a mild degree. Moderate amount of contrast laden stool within the colon. Electronically Signed   By: David  Martinique M.D.   On: 02/25/2018 07:53   Dg Abd 1 View  Result Date: 02/21/2018 CLINICAL DATA:  Abdominal pain and distention for 2 days EXAM: ABDOMEN - 1 VIEW COMPARISON:  CT abdomen pelvis of 02/13/2018 FINDINGS: FINDINGS Prominent soft tissue throughout the left flank appears to be due to the CT demonstrated retroperitoneal tumor  apparently sarcoma, much of the bowel is pushed to the right abdomen. No bowel obstruction is seen. Some contrast is noted within distal small bowel loops without distension. Surgical clips are noted overlying the pelvis. Sclerotic lesions are present throughout the bones of the pelvis consistent with metastasis. IMPRESSION: 1.  Paucity of bowel gas throughout the left flank most consistent with the CT demonstrated retroperitoneal soft tissue mass most consistent with sarcoma at by history. 2. No bowel obstruction. Some contrast node is noted within the small bowel distally. 3. Blastic pelvic bone lesions suggestive of metastasis. Electronically Signed   By: Ivar Drape M.D.   On: 02/21/2018 10:57   Ct Chest W Contrast  Result Date: 02/22/2018 CLINICAL DATA:  Retroperitoneal soft tissue sarcoma, for staging EXAM: CT CHEST WITH CONTRAST TECHNIQUE: Multidetector CT imaging of the chest was performed during intravenous contrast administration. CONTRAST:  48mL OMNIPAQUE IOHEXOL 300 MG/ML  SOLN COMPARISON:  Partial comparison to CT abdomen/pelvis dated 02/24/2018 FINDINGS: Cardiovascular: Heart is normal in size.  No pericardial effusion. No evidence of thoracic aortic aneurysm. Mild atherosclerotic calcifications of the aortic arch. Coronary atherosclerosis of the LAD. Mediastinum/Nodes: No suspicious mediastinal, hilar, or axillary lymphadenopathy. Visualized thyroid is unremarkable. Lungs/Pleura: Mild linear scarring/atelectasis in the right upper lobe (series 4/image 45). Mild linear/branching scarring/atelectasis in the left upper lobe (series 4/image 61). No suspicious pulmonary nodules. No focal consolidation. No pleural effusion or pneumothorax. Upper Abdomen: Better evaluated on recent CT abdomen/pelvis, noting a dominant left retroperitoneal sarcoma and left hydronephrosis. Musculoskeletal: Visualized osseous structures are within normal limits. IMPRESSION: No evidence of metastatic disease in the chest.  Left retroperitoneal sarcoma with left hydronephrosis, incompletely visualized and better evaluated on recent CT abdomen/pelvis. Aortic Atherosclerosis (ICD10-I70.0). Electronically Signed   By: Julian Hy M.D.   On: 02/22/2018 10:38   Ct Abdomen Pelvis W Contrast  Result Date: 02/06/2018 CLINICAL DATA:  62 year old female with a history of bloated sensation with diarrhea. Known left pelvis sarcoma, with the electronic records indicating treatment at [facility]. Biopsy was performed previously 06/01/2017. EXAM: CT ABDOMEN AND PELVIS WITH CONTRAST TECHNIQUE: Multidetector CT imaging of the abdomen and pelvis was performed using the standard protocol following bolus administration of intravenous contrast. CONTRAST:  169mL OMNIPAQUE IOHEXOL 300 MG/ML  SOLN COMPARISON:  MR 05/31/2017, CT 06/02/2015 FINDINGS: Lower chest: Lower chest unremarkable. No pleural effusion or nodules of the lung bases. Unremarkable visualized cardiac structures. Unremarkable chest wall. Hepatobiliary: Unremarkable appearance of liver parenchyma. Unremarkable gallbladder. Pancreas: Unremarkable pancreas. Spleen: Unremarkable spleen Adrenals/Urinary Tract: Unremarkable adrenal glands. Right kidney demonstrates moderate hydronephrosis and dilation of the proximal right ureter. The right ureter is inseparable from soft tissue in the right anatomic pelvis, as the ureter crosses the iliac vasculature. Left kidney with moderate to large hydronephrosis. The left ureter is visualized proximally to be circumferentially involved with retroperitoneal tumor. There is loss of visualization of the left ureter distally. Tumor tissue involves the inferior aspect of the left kidney and the lateral aspect of the left kidney, with tissue extending superiorly to the inferior margin of the spleen. Stomach/Bowel: Unremarkable stomach. Tumor tissues inseparable from the posterior aspect of the stomach. Small bowel loops have been displaced towards the patient's  right given the volume of tumor in the left retroperitoneum. Tissue of the rectum is inseparable from tumor tissue in the recto uterine space. The rectum/sigmoid rectal junction is displaced to the patient's right secondary to tumor tissue. The descending colon is up lifted and displaced, with the margins of sigmoid colon inseparable from tumor tissue in the abdomen/pelvis. The posterior aspects of the cecum is intimately associated with tumor tissue in the right anatomic pelvis. Unremarkable appendix. Proximal colon is somewhat distended into the transverse colon. The splenic flexure is not well visualized. Enteric contrast through the length of small bowel,  with mild dilation of small bowel loops. Small bowel loops within the right low abdomen are inseparable from tumor tissue at the level of the sacrum/sacral base. Vascular/Lymphatic: Atherosclerotic changes of the abdominal aorta. The left iliac system is circumferential involved with tumor tissue including both the distal common iliac artery, hypogastric artery, and external iliac artery which is uplifted through its course in the pelvis. The common femoral artery is circumferential involved with tumor in the proximal thigh. The left iliac vein is not visualized. Tumor tissue encroaches on the distal aorta just above the bifurcation, at the level of the inferior mesenteric artery origin. Mesenteric arteries of the low abdomen are displaced secondary to tumor tissue at the sacral base. Soft tissue implants overlying the right-sided rectus femora S measures 15 mm. Tumor tissue is inseparable from the left-sided abdominal musculature below the umbilicus, with tumor tissue at the umbilicus (sister Wynona Dove node). Reproductive: Hysterectomy. Tumor tissue occupies the anatomic pelvis, inseparable from the posterior urinary bladder and the rectum within the recto uterine space. Other: Interval enlargement of tumor within the left retroperitoneum/extra peritoneum,  with tumor now extending from the left abdominal musculature medially to the lumbar spine and aorta, up lifting the bowel and sigmoid colon. Tumor extends superior along the lateral kidney within the perinephric space and extra peritoneum to the spleen. Tumor extends inferiorly within the gluteal musculature and the anatomic pelvis into the proximal thigh. Musculoskeletal: Irregularity of the left iliac bone with rarefaction of the bone adjacent to the tumor. Osteopenia throughout the musculoskeletal system. No acute fracture identified. IMPRESSION: CT demonstrates significant progression of known left pelvic sarcoma. The tumor demonstrates predominantly retroperitoneal and extraperitoneal growth, occupying the left retroperitoneum, with extension superiorly to the inferior spleen, lateral to the kidney, medially to the infrarenal aorta and lumbar spine, inferior medially into the anatomic pelvis, and inferior laterally into the proximal left thigh. Sequela include: -left moderate to large hydronephrosis -mild to moderate right hydronephrosis -compression of distal small bowel loops and rectosigmoid junction, resulting in at least partial small bowel obstruction/colonic obstruction -circumferential involvement of the left iliac arteries including common, internal iliac, external iliac, and common femoral artery -likely obstruction of the left iliac venous system -extraperitoneal spread along the bilateral abdominal wall musculature, including lymphatic involvement at the umbilicus (sister Wynona Dove node). These results were called by telephone at the time of interpretation on 02/10/2018 at 5:25 pm to Dr. Maree Erie [facility] , who verbally acknowledged these results. Electronically Signed   By: Corrie Mckusick D.O.   On: 02/14/2018 17:25   Dg Abd Portable 1v  Result Date: 02/26/2018 CLINICAL DATA:  NG tube placement. EXAM: PORTABLE ABDOMEN - 1 VIEW COMPARISON:  02/25/2018. FINDINGS: NG tube noted in the stomach. No  gastric distention. Bilateral ureteral stents noted. Distended loops of small large bowel noted suggesting adynamic ileus. Oral contrast in the colon. IMPRESSION: 1. NG tube noted coiled in the stomach. Bilateral double-J ureteral stents noted in good anatomic position. 2. Distended loops of small large bowel consistent adynamic ileus. Contrast noted in the colon. Electronically Signed   By: Marcello Moores  Register   On: 02/26/2018 12:34   Dg C-arm 1-60 Min-no Report  Result Date: 02/23/2018 Fluoroscopy was utilized by the requesting physician.  No radiographic interpretation.   Ir Picc Placement Right >5 Yrs Inc Img Guide  Result Date: 02/27/2018 INDICATION: Poor venous access.  Request PICC line placement for IV access. EXAM: RIGHT UPPER EXTREMITY PICC LINE PLACEMENT WITH ULTRASOUND AND FLUOROSCOPIC GUIDANCE MEDICATIONS:  None; ANESTHESIA/SEDATION: Moderate Sedation Time:  None The patient was continuously monitored during the procedure by the interventional radiology nurse under my direct supervision. FLUOROSCOPY TIME:  Fluoroscopy Time: 18 seconds COMPLICATIONS: None immediate. PROCEDURE: The patient was advised of the possible risks and complications and agreed to undergo the procedure. The patient was then brought to the angiographic suite for the procedure. The right arm was prepped with chlorhexidine, draped in the usual sterile fashion using maximum barrier technique (cap and mask, sterile gown, sterile gloves, large sterile sheet, hand hygiene and cutaneous antiseptic). Local anesthesia was attained by infiltration with 1% lidocaine. Ultrasound demonstrated patency of the brachial vein, and this was documented with an image. Under real-time ultrasound guidance, this vein was accessed with a 21 gauge micropuncture needle and image documentation was performed. The needle was exchanged over a guidewire for a peel-away sheath through which a 36 cm 5 [place] dual lumen power injectable PICC was advanced, and  positioned with its tip at the lower SVC/right atrial junction. Fluoroscopy during the procedure and fluoro spot radiograph confirms appropriate catheter position. The catheter was flushed, secured to the skin, and covered with a sterile dressing. IMPRESSION: Successful placement of a right arm PICC with sonographic and fluoroscopic guidance. The catheter is ready for use. Read by: Ascencion Dike PA-C Electronically Signed   By: Jerilynn Mages.  Shick M.D.   On: 02/26/2018 17:32   [place] Ekg Site Rite  Result Date: 02/26/2018 If Site Rite image not attached, placement could not be confirmed due to current cardiac rhythm.   Labs:  CBC: Recent Labs    02/25/18 0522 02/26/18 0517 02/27/18 0631 02/28/18 0558  WBC 16.9* 16.7* 27.8* 27.6*  HGB 9.0* 9.7* 9.2* 8.5*  HCT 27.9* 29.9* 28.2* 25.9*  PLT 575* 550* 525* 458*    COAGS: Recent Labs    06/01/17 0902 02/27/18 1300  INR 1.00 1.16    BMP: Recent Labs    02/25/18 0522 02/26/18 0517 02/27/18 0631 02/28/18 0558  NA 140 140 143 143  K 4.5 3.8 3.9 3.9  CL 113* 113* 114* 114*  CO2 17* 20* 16* 18*  GLUCOSE 107* 94 78 88  BUN 12 13 15 17   CALCIUM 8.7* 8.7* 8.7* 8.7*  CREATININE 1.30* 0.99 0.86 0.96  GFRNONAA 44* >60 >60 >60  GFRAA 51* >60 >60 >60    LIVER FUNCTION TESTS: Recent Labs    07/24/17 1357 09/10/17 0907 10/19/17 1354 02/06/2018 1256  BILITOT 0.39 0.6 0.3 0.4  AST 12 13 14 16   ALT 22 17 10 10   ALKPHOS 71 73 80 70  PROT 6.7 7.0 8.0 6.7  ALBUMIN 3.3* 3.8 3.7 2.8*    TUMOR MARKERS: No results for input(s): AFPTM, CEA, CA199, CHROMGRNA in the last 8760 hours.  Assessment and Plan:  Small bowel obstruction. Plan for image-guided percutaneous gastrostomy tube placement. Patient is NPO.  Denies fever. She does not take blood thinners. INR 1.16 seconds 02/27/2018.  Risks and benefits discussed with the patient including, but not limited to the need for a barium enema during the procedure, bleeding, infection, peritonitis,  or damage to adjacent structures. All of the patient's questions were answered, patient is agreeable to proceed. Consent signed and in chart.   Thank you for this interesting consult.  I greatly enjoyed meeting Anita Whitehead and look forward to participating in their care.  A copy of this report was sent to the requesting provider on this date.  Electronically Signed: Earley Abide, PA-C 02/28/2018, 12:05 PM  I spent a total of 20 Minutes in face to face in clinical consultation, greater than 50% of which was counseling/coordinating care for small bowel obstruction.

## 2018-02-28 NOTE — Procedures (Signed)
Pre procedure Dx: Malignant enteric obstruction Post Procedure Dx: Same  Successful fluoroscopic guided insertion of gastrostomy tube.   The gastrostomy tube may be used immediately for ventilation and medications.   Tube feeds may be initiated in 24 hours as per the primary team.    EBL: Minimal  Complications: None immediate  Ronny Bacon, MD Pager #: [phone]

## 2018-02-28 NOTE — Progress Notes (Signed)
MEDICATION-RELATED CONSULT NOTE   IR Procedure Consult - Anticoagulant/Antiplatelet PTA/Inpatient Med List Review by Pharmacist    Procedure: fluoroscopic guided insertion of gastrostomy tube    Completed: 02/28/18 at 1525  Post-Procedural bleeding risk per IR MD assessment: Standard   Antithrombotic medications on inpatient or PTA profile prior to procedure: Enoxaparin 40mg  SQ q24h     Recommended restart time per IR Post-Procedure Guidelines: next AM    Plan:      Adjust start time of next dose of Enoxaparin 40mg  SQ q24h from 02/28/18 at 1800 to 03/01/18 at [place], PharmD, BCPS Pager: [phone] 02/28/2018 4:30 PM

## 2018-02-28 NOTE — Progress Notes (Signed)
PROGRESS NOTE    Anita Whitehead  QBV:[id number] DOB: 10-29-1955 DOA: 02/25/2018 PCP: [name], NP    Brief Narrative:  62 year old with past medical history relevant for remote history of cervical cancer status post radiation, hypertension, hyperlipidemia, known left-sided high-grade sarcoma who was admitted with partial small bowel obstruction to [facility] on 02/07/2018 and was noted to have mild AKI and bilateral left greater than right hydronephrosis.  She initially had mild improvement of her partial small bowel obstruction as she was not deemed a surgical candidate by general surgery and was tolerating clear liquids.  She was transferred to [facility] on 02/23/2018 and on 02/07/2018 received bilateral ureteral stents.  She is currently now n.p.o. due to complete bowel obstruction and has an NG tube and is pending a venting gastrostomy.   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 -Continue NG tube -Pending venting PEG tube by interventional radiology today on 02/28/2018 - Continue IV fluids, n.p.o. (currently no interest in TPN) -Palliative care following appreciate recommendations, continue PCA and start fentanyl patch today per palliative care -Oncology following appreciate recommendations, plan is for palliative chemotherapy with doxorubicin with ifosfamide and mesna -Patient continues to be full code and to agree with palliative chemotherapy  #) Bilateral hydronephrosis, gated by UTI: Status post bilateral ureteral stents on  02/16/2018 -Urine culture from 02/18/2018 growing a Klebsiella that is pansensitive -Repeat urine culture from 02/26/2018 no growth to date -Continue cefazolin started 03/03/2018 -Urology consult, appreciate recommendations  #) Anemia: Likely related to malignancy and iron deficiency. -Status post a dose of IV iron on 02/22/2018 and 02/26/2018  #) Hypertension/tachycardia: Improved with IV metoprolol -Patient is on carvedilol 12.5 mg twice daily at home, -Continue IV metoprolol tartrate's 5 mg every 6 hours here -Consider CTA for tachycardia  Fluids: Gentle IV fluids Elect lites: Monitor and supplement Nutrition: N.p.o.  Prophylaxis: Enoxaparin  Disposition: Pending clinical stability and palliative chemotherapy  Full code  Consultants:   Palliative care  Oncology  Urology  Interventional radiology  Procedures:   02/13/2018 status post bilateral ureteral stents  Antimicrobials:   Continue cefazolin started 02/27/2018   Subjective: Patient reports she is feeling well with her pain under control.  She denies any nausea, vomiting, diarrhea.  She denies any bowel movements.  She has not had any flatus.  Objective: Vitals:   02/28/18 0030 02/28/18 0249 02/28/18 0521 02/28/18 0855  BP: 121/79  (!) 130/104   Pulse: (!) 117  (!) 120   Resp: 15 14 10 17   Temp: 98.2 F (36.8 C)  97.9 F (36.6 C)   TempSrc: Oral  Oral   SpO2:  99% (!) 78% 100%  Weight:      Height:        Intake/Output Summary (Last 24 hours) at 02/28/2018 1238 Last data filed at 02/27/2018 1500 Gross per 24 hour  Intake 1295 ml  Output 400 ml  Net 895 ml   Filed Weights   02/09/2018 1121 02/05/2018 2300  Weight: 48.5 kg (107 lb) 47.6 kg (104 lb 15 oz)  Examination:  General exam: Appears calm and comfortable  Respiratory system: Clear to auscultation. Respiratory effort normal. Cardiovascular system: Tachycardic, regular rhythm, no murmurs Gastrointestinal system: Distended, tender to  palpation, no rebound or guarding, no bowel sounds Central nervous system: Alert and oriented. No focal neurological deficits. Extremities: Lower extremity edema Skin: Picc site is clean dry and intact Psychiatry: Judgement and insight appear normal. Mood & affect appropriate.     Data Reviewed: I have personally reviewed following labs and imaging studies  CBC: Recent Labs  Lab 02/22/18 0625 02/18/2018 1230 02/25/18 0522 02/26/18 0517 02/27/18 0631 02/28/18 0558  WBC 13.0* 17.5* 16.9* 16.7* 27.8* 27.6*  NEUTROABS 11.1*  --   --   --   --   --   HGB 9.3* 10.9* 9.0* 9.7* 9.2* 8.5*  HCT 31.2* 33.5* 27.9* 29.9* 28.2* 25.9*  MCV 83.2 78.3 77.5* 78.1 78.1 78.2  PLT 508* 660* 575* 550* 525* 889*   Basic Metabolic Panel: Recent Labs  Lab 02/17/2018 1230 02/25/18 0522 02/26/18 0517 02/27/18 0631 02/28/18 0558  NA 141 140 140 143 143  K 3.5 4.5 3.8 3.9 3.9  CL 113* 113* 113* 114* 114*  CO2 18* 17* 20* 16* 18*  GLUCOSE 93 107* 94 78 88  BUN 10 12 13 15 17   CREATININE 1.72* 1.30* 0.99 0.86 0.96  CALCIUM 9.0 8.7* 8.7* 8.7* 8.7*  MG  --   --   --   --  1.9   GFR: Estimated Creatinine Clearance: 47.4 mL/min (by C-G formula based on SCr of 0.96 mg/dL). Liver Function Tests: No results for input(s): AST, ALT, ALKPHOS, BILITOT, PROT, ALBUMIN in the last 168 hours. No results for input(s): LIPASE, AMYLASE in the last 168 hours. No results for input(s): AMMONIA in the last 168 hours. Coagulation Profile: Recent Labs  Lab 02/27/18 1300  INR 1.16   Cardiac Enzymes: No results for input(s): CKTOTAL, CKMB, CKMBINDEX, TROPONINI in the last 168 hours. BNP (last 3 results) No results for input(s): PROBNP in the last 8760 hours. HbA1C: No results for input(s): HGBA1C in the last 72 hours. CBG: No results for input(s): GLUCAP in the last 168 hours. Lipid Profile: No results for input(s): CHOL, HDL, LDLCALC, TRIG, CHOLHDL, LDLDIRECT in the last 72 hours. Thyroid Function Tests: No  results for input(s): TSH, T4TOTAL, FREET4, T3FREE, THYROIDAB in the last 72 hours. Anemia Panel: No results for input(s): VITAMINB12, FOLATE, FERRITIN, TIBC, IRON, RETICCTPCT in the last 72 hours. Sepsis Labs: No results for input(s): PROCALCITON, LATICACIDVEN in the last 168 hours.  Recent Results (from the past 240 hour(s))  Urine culture     Status: Abnormal   Collection Time: 03/04/2018  1:56 PM  Result Value Ref Range Status   Specimen Description URINE, RANDOM  Final   Special Requests   Final    NONE Performed at [facility], [address], [place], [place] [address]    Culture >=100,000 COLONIES/mL KLEBSIELLA PNEUMONIAE (A)  Final   Report Status 02/22/2018 FINAL  Final   Organism ID, Bacteria KLEBSIELLA PNEUMONIAE (A)  Final      Susceptibility   Klebsiella pneumoniae - MIC*    AMPICILLIN RESISTANT Resistant     CEFAZOLIN <=4 SENSITIVE Sensitive     CEFTRIAXONE <=1 SENSITIVE Sensitive     CIPROFLOXACIN <=0.25 SENSITIVE Sensitive     GENTAMICIN <=1 SENSITIVE Sensitive     IMIPENEM <=0.25 SENSITIVE Sensitive     NITROFURANTOIN <=16 SENSITIVE Sensitive     TRIMETH/SULFA <=20  SENSITIVE Sensitive     AMPICILLIN/SULBACTAM 4 SENSITIVE Sensitive     PIP/TAZO <=4 SENSITIVE Sensitive     Extended ESBL NEGATIVE Sensitive     * >=100,000 COLONIES/mL KLEBSIELLA PNEUMONIAE  Culture, Urine     Status: None   Collection Time: 02/26/18  1:35 PM  Result Value Ref Range Status   Specimen Description   Final    URINE, CLEAN CATCH Performed at [facility], [place] [address] [place], [place] [address]    Special Requests   Final    NONE Performed at [facility], [place] [address] [place], [place] [address]    Culture   Final    NO GROWTH Performed at [facility], [place] [address] [place], [place] [address]    Report Status 02/27/2018 FINAL  Final         Radiology Studies: Dg Abd 1 View  Result Date: 02/27/2018 CLINICAL  DATA:  Follow-up ileus. Current history of large sarcoma involving the LEFT side of the abdomen and pelvis extending into the proximal LEFT thigh. EXAM: ABDOMEN - 1 VIEW COMPARISON:  KUB 02/26/2018, 02/25/2018. CT abdomen and pelvis 02/09/2018. FINDINGS: Persistent moderately distended loops of small bowel, unchanged, displaced into the mid abdomen and RIGHT UPPER QUADRANT due to the large sarcoma as identified on the recent CT. Contrast material within the colon, though the distribution of the contrast has not significantly changed over the past 2 days, indicating adynamic ileus. BILATERAL double-J ureteral stents as noted previously, unchanged in position. Nasogastric tube with its tip in the body of the stomach. IMPRESSION: 1. Stable ileus. 2. Ileus is confirmed by the fact that the contrast material in the colon has not significantly progressed over the past 2 days, indicating an adynamic state. 3. Nasogastric tube tip in the body of the stomach. Electronically Signed   By: Evangeline Dakin M.D.   On: 02/27/2018 12:37   Ir Picc Placement Right >5 Yrs Inc Img Guide  Result Date: 02/27/2018 INDICATION: Poor venous access.  Request PICC line placement for IV access. EXAM: RIGHT UPPER EXTREMITY PICC LINE PLACEMENT WITH ULTRASOUND AND FLUOROSCOPIC GUIDANCE MEDICATIONS: None; ANESTHESIA/SEDATION: Moderate Sedation Time:  None The patient was continuously monitored during the procedure by the interventional radiology nurse under my direct supervision. FLUOROSCOPY TIME:  Fluoroscopy Time: 18 seconds COMPLICATIONS: None immediate. PROCEDURE: The patient was advised of the possible risks and complications and agreed to undergo the procedure. The patient was then brought to the angiographic suite for the procedure. The right arm was prepped with chlorhexidine, draped in the usual sterile fashion using maximum barrier technique (cap and mask, sterile gown, sterile gloves, large sterile sheet, hand hygiene and cutaneous  antiseptic). Local anesthesia was attained by infiltration with 1% lidocaine. Ultrasound demonstrated patency of the brachial vein, and this was documented with an image. Under real-time ultrasound guidance, this vein was accessed with a 21 gauge micropuncture needle and image documentation was performed. The needle was exchanged over a guidewire for a peel-away sheath through which a 36 cm 5 [place] dual lumen power injectable PICC was advanced, and positioned with its tip at the lower SVC/right atrial junction. Fluoroscopy during the procedure and fluoro spot radiograph confirms appropriate catheter position. The catheter was flushed, secured to the skin, and covered with a sterile dressing. IMPRESSION: Successful placement of a right arm PICC with sonographic and fluoroscopic guidance. The catheter is ready for use. Read by: Ascencion Dike PA-C Electronically Signed   By: Jerilynn Mages.  Shick M.D.   On: 02/26/2018 17:32        Scheduled Meds: . enoxaparin (LOVENOX) injection  40 mg Subcutaneous Q24H  . HYDROmorphone   Intravenous Q4H  . metoprolol tartrate  5 mg Intravenous Q6H   Continuous Infusions: . sodium chloride 100 mL/hr at 02/28/18 0828  .  ceFAZolin (ANCEF) IV 1 g (02/28/18 1137)  . ondansetron (ZOFRAN) IV       LOS: 8 days    Time spent: [place], MD [facility]  If 7PM-7AM, please contact night-coverage [email or web address] Password [facility] 02/28/2018, 12:38 PM

## 2018-03-01 ENCOUNTER — Encounter: Payer: Self-pay | Admitting: Hematology and Oncology

## 2018-03-01 DIAGNOSIS — R001 Bradycardia, unspecified: Secondary | ICD-10-CM

## 2018-03-01 DIAGNOSIS — D72829 Elevated white blood cell count, unspecified: Secondary | ICD-10-CM

## 2018-03-01 DIAGNOSIS — Z5111 Encounter for antineoplastic chemotherapy: Secondary | ICD-10-CM

## 2018-03-01 LAB — CBC
HCT: 24.8 % — ABNORMAL LOW (ref 36.0–46.0)
Hemoglobin: 8.2 g/dL — ABNORMAL LOW (ref 12.0–15.0)
MCH: 26.2 pg (ref 26.0–34.0)
MCHC: 33.1 g/dL (ref 30.0–36.0)
MCV: 79.2 fL (ref 78.0–100.0)
Platelets: 379 K/uL (ref 150–400)
RBC: 3.13 MIL/uL — ABNORMAL LOW (ref 3.87–5.11)
RDW: 21.6 % — ABNORMAL HIGH (ref 11.5–15.5)
WBC: 27.1 10*3/uL — ABNORMAL HIGH (ref 4.0–10.5)

## 2018-03-01 LAB — BASIC METABOLIC PANEL
Anion gap: 11 (ref 5–15)
CO2: 20 mmol/L — ABNORMAL LOW (ref 22–32)
Chloride: 117 mmol/L — ABNORMAL HIGH (ref 98–111)
Creatinine, Ser: 0.89 mg/dL (ref 0.44–1.00)
Glucose, Bld: 74 mg/dL (ref 70–99)
Potassium: 3.9 mmol/L (ref 3.5–5.1)

## 2018-03-01 LAB — DIFFERENTIAL
Basophils Absolute: 0 K/uL (ref 0.0–0.1)
Basophils Relative: 0 %
Eosinophils Absolute: 0 K/uL (ref 0.0–0.7)
Eosinophils Relative: 0 %
Lymphocytes Relative: 3 %
Lymphs Abs: 0.8 10*3/uL (ref 0.7–4.0)
Monocytes Absolute: 1.4 10*3/uL — ABNORMAL HIGH (ref 0.1–1.0)
Monocytes Relative: 5 %
Neutro Abs: 25 10*3/uL — ABNORMAL HIGH (ref 1.7–7.7)
Neutrophils Relative %: 92 %

## 2018-03-01 LAB — BASIC METABOLIC PANEL WITH GFR
BUN: 18 mg/dL (ref 6–20)
Calcium: 8.8 mg/dL — ABNORMAL LOW (ref 8.9–10.3)
GFR calc Af Amer: >60 mL/min (ref >60)
GFR calc non Af Amer: >60 mL/min (ref >60)
Sodium: 148 mmol/L — ABNORMAL HIGH (ref 135–145)

## 2018-03-01 MED ORDER — SODIUM CHLORIDE 0.9 % IV SOLN
300.0000 mg/m2 | INTRAVENOUS | Status: AC
  Administered 2018-03-01 (×2): 450 mg via INTRAVENOUS
  Filled 2018-03-01 (×2): qty 4.5

## 2018-03-01 MED ORDER — PALONOSETRON HCL INJECTION 0.25 MG/5ML
0.2500 mg | Freq: Once | INTRAVENOUS | Status: AC
  Administered 2018-03-01: 0.25 mg via INTRAVENOUS
  Filled 2018-03-01: qty 5

## 2018-03-01 MED ORDER — SODIUM CHLORIDE 0.9% FLUSH: 10.0000 mL | INTRAVENOUS | Status: DC | PRN

## 2018-03-01 MED ORDER — ALTEPLASE 2 MG IJ SOLR: 2.0000 mg | Freq: Once | INTRAMUSCULAR | Status: DC | PRN

## 2018-03-01 MED ORDER — SODIUM CHLORIDE 0.9% FLUSH: 3.0000 mL | INTRAVENOUS | Status: DC | PRN

## 2018-03-01 MED ORDER — HEPARIN SOD (PORK) LOCK FLUSH 100 UNIT/ML IV SOLN: 250.0000 [IU] | Freq: Once | INTRAVENOUS | Status: DC | PRN

## 2018-03-01 MED ORDER — SODIUM CHLORIDE 0.9 % IV SOLN
INTRAVENOUS | Status: DC
  Administered 2018-03-01 – 2018-03-13 (×2): via INTRAVENOUS

## 2018-03-01 MED ORDER — SODIUM CHLORIDE 0.9 % IV SOLN
300.0000 mg/m2 | Freq: Once | INTRAVENOUS | Status: AC
  Administered 2018-03-01: 450 mg via INTRAVENOUS
  Filled 2018-03-01: qty 4.5

## 2018-03-01 MED ORDER — SODIUM ACETATE 2 MEQ/ML IV SOLN
INTRAVENOUS | Status: AC
  Administered 2018-03-01 (×2): via INTRAVENOUS
  Filled 2018-03-01 (×5): qty 60

## 2018-03-01 MED ORDER — SODIUM CHLORIDE 0.9 % IV SOLN
Freq: Once | INTRAVENOUS | Status: AC
  Administered 2018-03-01: 12:00:00 via INTRAVENOUS
  Filled 2018-03-01: qty 5

## 2018-03-01 MED ORDER — SODIUM CHLORIDE 0.45 % IV SOLN
INTRAVENOUS | Status: DC
  Administered 2018-03-01 – 2018-03-02 (×3): via INTRAVENOUS

## 2018-03-01 MED ORDER — COLD PACK MISC ONCOLOGY
1.0000 | Freq: Once | Status: DC | PRN
  Filled 2018-03-01: qty 1

## 2018-03-01 MED ORDER — HEPARIN SOD (PORK) LOCK FLUSH 100 UNIT/ML IV SOLN
500.0000 [IU] | Freq: Once | INTRAVENOUS | Status: DC | PRN
  Filled 2018-03-01: qty 5

## 2018-03-01 MED ORDER — IFOSFAMIDE CHEMO INJECTION 3 GM
1500.0000 mg/m2 | Freq: Once | INTRAVENOUS | Status: AC
  Administered 2018-03-01: 2200 mg via INTRAVENOUS
  Filled 2018-03-01: qty 44

## 2018-03-01 MED ORDER — SODIUM CHLORIDE 0.9 % IV SOLN
20.0000 mg/m2 | Freq: Once | INTRAVENOUS | Status: AC
  Administered 2018-03-01: 30 mg via INTRAVENOUS
  Filled 2018-03-01: qty 15

## 2018-03-01 MED ORDER — FENTANYL 100 MCG/HR TD PT72
200.0000 ug | MEDICATED_PATCH | TRANSDERMAL | Status: DC
  Administered 2018-03-01: 200 ug via TRANSDERMAL
  Filled 2018-03-01: qty 2

## 2018-03-01 NOTE — Progress Notes (Signed)
Pt tolerated chemotherapy well. No complaints at this time. No distress noted.
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
## 2018-03-01 NOTE — Progress Notes (Signed)
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  She agreed to proceed with the plan of care.  She understood that the goals of treatment is palliative.  Hydronephrosiswith UTI She has complicated urinary tract infection,will continue IV antibiotics for total 10 days She had successful stent placement by urologist. Repeat urine culture was negative Continue close monitoring of kidney function  Completebowel obstruction She has no bowel movement despite enema She has signs and symptoms of complete bowel obstruction She is not a surgical candidate She is receiving IV fluids.  Venting gastrostomy tube was placed yesterday. I do not plan to start her on TPN until she completes chemotherapy, hopefully next week  Anemia Thrombocytosis Likely related to recurrence of malignancy There is a component of iron deficiency She has received 1 dose of intravenous ironon 7/19/2019and second dose yesterday on 02/26/2018 She is mildly symptomatic with tachycardia.  Recommend blood transfusion this weekend if hemoglobin is less than 8.  Tachycardia Due to anemia Echocardiogram is normal Proceed with treatment  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Moderate protein calorie malnutrition Due to recent infection, I would like to hold off total parenteral nutrition for now until completion of chemotherapy next week  Goals of care She is aware that her cancer is incurable but is interested to try palliative treatment if possible I have a long discussion with the patient and her husband. The risk of chemotherapy is very high. However, without chemotherapy, she would likely succumb to the disease very quickly without supportive care We discussed the importance of advanced directive  and medical healthcare power of attorney I have recently completed the paperwork to certify her permanently disabled  Severe cancer pain, improved on PCA She has unreliable GI absorption I appreciated assistant frompalliative care consult for symptom management and assistance in severe cancer pain management  Discharge planning Not ready for discharge Given significant deterioration of her disease, she would likely be here for several weeks Plan of care is discussed with the patient  Dr. Alen Blew will round on the patient this weekend.  I will return on Monday  [name], MD 03/01/2018  7:43 AM   Subjective:  She feels well this morning, making jokes.  Venting gastrostomy tube was placed yesterday.  Currently, the tube is placed on gravity.  Bilious liquid is seen.  She felt much better.  Pain is under control.  She denies nausea.  She is ready to proceed with planned chemotherapy today  Objective:  Vitals:   03/01/18 0642 03/01/18 0731  BP: 107/84   Pulse: (!) 122   Resp: 10 12  Temp: 98.5 F (36.9 C)   SpO2: 100% 100%    No intake or output data in the 24 hours ending 03/01/18 0743  GENERAL:alert, no distress and comfortable SKIN: skin color, texture, turgor are normal, no rashes or significant lesions EYES: normal, Conjunctiva are pale and non-injected, sclera clear OROPHARYNX:no exudate, no erythema and lips, buccal mucosa, and tongue normal  NECK: supple, thyroid normal size, non-tender, without nodularity LYMPH:  no palpable lymphadenopathy in the cervical, axillary or inguinal LUNGS: clear to auscultation and percussion with normal breathing effort HEART: tachycardia, no murmurs and no lower extremity edema ABDOMEN:abdomen soft, mildly distended, no bowel sounds Musculoskeletal:no cyanosis of digits and no clubbing  NEURO: alert & oriented x 3 with fluent  speech, no focal motor/sensory deficits   Labs:  Lab Results  Component Value Date   WBC 27.1 (H)  03/01/2018   HGB 8.2 (L) 03/01/2018   HCT 24.8 (L) 03/01/2018   MCV 79.2 03/01/2018   PLT 379 03/01/2018   NEUTROABS 11.1 (H) 02/22/2018    Lab Results  Component Value Date   NA 148 (H) 03/01/2018   K 3.9 03/01/2018   CL 117 (H) 03/01/2018   CO2 20 (L) 03/01/2018    Studies:  Dg Abd 1 View  Result Date: 02/27/2018 CLINICAL DATA:  Follow-up ileus. Current history of large sarcoma involving the LEFT side of the abdomen and pelvis extending into the proximal LEFT thigh. EXAM: ABDOMEN - 1 VIEW COMPARISON:  KUB 02/26/2018, 02/25/2018. CT abdomen and pelvis 02/21/2018. FINDINGS: Persistent moderately distended loops of small bowel, unchanged, displaced into the mid abdomen and RIGHT UPPER QUADRANT due to the large sarcoma as identified on the recent CT. Contrast material within the colon, though the distribution of the contrast has not significantly changed over the past 2 days, indicating adynamic ileus. BILATERAL double-J ureteral stents as noted previously, unchanged in position. Nasogastric tube with its tip in the body of the stomach. IMPRESSION: 1. Stable ileus. 2. Ileus is confirmed by the fact that the contrast material in the colon has not significantly progressed over the past 2 days, indicating an adynamic state. 3. Nasogastric tube tip in the body of the stomach. Electronically Signed   By: Evangeline Dakin M.D.   On: 02/27/2018 12:37   Ir Gastrostomy Tube Mod Sed  Result Date: 02/28/2018 INDICATION: History of recurrent retroperitoneal sarcoma now with enteric obstruction. Request made for placement gastrostomy tube for gastric venting purposes. EXAM: PULL TROUGH GASTROSTOMY TUBE PLACEMENT COMPARISON:  CT abdomen and pelvis-02/15/2018 MEDICATIONS: The patient is currently admitted to the hospital receiving intravenous antibiotics; Antibiotics were administered within 1 hour of the procedure. Glucagon 1 mg IV CONTRAST:  20 cc Isovue-300 administered into the gastric lumen.  ANESTHESIA/SEDATION: Moderate (conscious) sedation was employed during this procedure. A total of Versed 3 mg and Dilaudid 2 mg was administered intravenously. Moderate Sedation Time: 16 minutes. The patient's level of consciousness and vital signs were monitored continuously by radiology nursing throughout the procedure under my direct supervision. FLUOROSCOPY TIME:  4 minutes 42 seconds (63 mGy) COMPLICATIONS: None immediate. PROCEDURE: Informed written consent was obtained from the patient following explanation of the procedure, risks, benefits and alternatives. A time out was performed prior to the initiation of the procedure. Ultrasound scanning was performed to demarcate the edge of the left lobe of the liver. Maximal barrier sterile technique utilized including caps, mask, sterile gowns, sterile gloves, large sterile drape, hand hygiene and Betadine prep. The left upper quadrant was sterilely prepped and draped. An oral gastric catheter was inserted into the stomach under fluoroscopy. The existing nasogastric feeding tube was removed. The left costal margin and air opacified transverse colon were identified and avoided. Air was injected into the stomach for insufflation and visualization under fluoroscopy. Under sterile conditions a 17 gauge trocar needle was utilized to access the stomach percutaneously beneath the left subcostal margin after the overlying soft tissues were anesthetized with 1% Lidocaine with epinephrine. Needle position was confirmed within the stomach with aspiration of air and injection of small amount of contrast. A single T tack was deployed for gastropexy. Over an Amplatz guide wire, a 9-French sheath was inserted into the stomach. A snare device was utilized to capture the oral gastric  catheter. The snare device was pulled retrograde from the stomach up the esophagus and out the oropharynx. The 20-French pull-through gastrostomy was connected to the snare device and pulled antegrade  through the oropharynx down the esophagus into the stomach and then through the percutaneous tract external to the patient. The gastrostomy was assembled externally. Contrast injection confirms position in the stomach. Several spot radiographic images were obtained in various obliquities for documentation. The patient tolerated procedure well without immediate post procedural complication. FINDINGS: After successful fluoroscopic guided placement, the gastrostomy tube is appropriately positioned with internal disc against the ventral aspect of the gastric lumen. IMPRESSION: Successful fluoroscopic insertion of a 20-French pull-through gastrostomy tube. The gastrostomy may be used immediately for venting and/or medication administration and in 24 hrs for the initiation of feeds. Electronically Signed   By: Sandi Mariscal M.D.   On: 02/28/2018 16:18

## 2018-03-01 NOTE — Progress Notes (Signed)
Doses and dilutions for Doxorubicin, Ifos and Mesna verified with Meredith Pel, RN.

## 2018-03-01 NOTE — Progress Notes (Signed)
PROGRESS NOTE    Anita Whitehead  MBW:[id number] DOB: 1955/12/06 DOA: 02/14/2018 PCP: [name], NP    Brief Narrative:  62 year old with past medical history relevant for remote history of cervical cancer status post radiation, hypertension, hyperlipidemia, known left-sided high-grade sarcoma who was admitted with partial small bowel obstruction to [facility] on 02/08/2018 and was noted to have mild AKI and bilateral left greater than right hydronephrosis.  She initially had mild improvement of her partial small bowel obstruction as she was not deemed a surgical candidate by general surgery and was tolerating clear liquids.  She was transferred to [facility] on 02/23/2018 and on 03/05/2018 received bilateral ureteral stents.  She is currently now n.p.o. due to complete bowel obstruction and has an NG tube and is status post venting gastrostomy.   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 -NG tube removed yesterday -Status post venting PEG tube by interventional radiology on 02/28/2018, currently to straight drain with bilious material, PEG tube can be used for medications today in the afternoon on 03/01/2018 - Transition to half-normal saline due to mild hyponatremia, possible plan for TPN after chemotherapy -Palliative care following appreciate recommendations, transitioning off PCA to oral and topical options -Oncology following appreciate recommendations, plan is for palliative chemotherapy with doxorubicin with ifosfamide and mesna  today  #) Bilateral hydronephrosis, gated by UTI: Status post bilateral ureteral stents on 03/02/2018 -Urine culture from 02/16/2018 growing a Klebsiella that is pansensitive -Repeat urine culture from 02/26/2018 no growth to date -Continue cefazolin started 02/20/2018 -Urology consult, appreciate recommendations  #) Anemia: Likely related to malignancy and iron deficiency. -Status post a dose of IV iron on 02/22/2018 and 02/26/2018  #) Hypertension/tachycardia: Improved with IV metoprolol -Patient is on carvedilol 12.5 mg twice daily at home, -Continue IV metoprolol tartrate's 5 mg every 6 hours here  Fluids: Half-normal saline Elect lites: Monitor and supplement Nutrition: N.p.o. except for ice chips  Prophylaxis: Enoxaparin  Disposition: Pending palliative chemotherapy  Full code  Consultants:   Palliative care  Oncology  Urology  Interventional radiology  Procedures:   02/07/2018 status post bilateral ureteral stents  Antimicrobials:   Continue cefazolin started 02/27/2018   Subjective: Patient reports she is feeling well with her pain under control.  She has no pain at the venting gastrostomy site.  She denies any nausea, vomiting, diarrhea.  She denies any bowel movements.  She has not had any flatus.  Objective: Vitals:   03/01/18 0309 03/01/18 0423 03/01/18 0642 03/01/18 0731  BP: 117/82  107/84   Pulse: (!) 120  (!) 122   Resp: 13 10 10 12   Temp: 98 F (36.7 C)  98.5 F (36.9 C)   TempSrc: Oral  Oral   SpO2: 100% 97% 100% 100%  Weight:      Height:       No intake or output data in the 24 hours ending 03/01/18 0934 Filed Weights   02/23/2018 1121 03/06/2018 2300  Weight: 48.5 kg (107 lb) 47.6 kg (104 lb 15 oz)    Examination:  General exam: Appears calm and comfortable  Respiratory system: Clear to auscultation. Respiratory effort normal. Cardiovascular system: Tachycardic, regular rhythm, no murmurs Gastrointestinal system: Distended, tender to  palpation, no rebound or guarding, no bowel sounds Central nervous system: Alert and oriented. No focal neurological deficits. Extremities: Lower extremity edema Skin: Picc site is clean dry and intact, venting G-tube site is clean dry and intact Psychiatry: Judgement and insight appear normal. Mood & affect appropriate.     Data Reviewed: I have personally reviewed following labs and imaging studies  CBC: Recent Labs  Lab 02/25/18 0522 02/26/18 0517 02/27/18 0631 02/28/18 0558 03/01/18 0559  WBC 16.9* 16.7* 27.8* 27.6* 27.1*  NEUTROABS  --   --   --   --  25.0*  HGB 9.0* 9.7* 9.2* 8.5* 8.2*  HCT 27.9* 29.9* 28.2* 25.9* 24.8*  MCV 77.5* 78.1 78.1 78.2 79.2  PLT 575* 550* 525* 458* 371   Basic Metabolic Panel: Recent Labs  Lab 02/25/18 0522 02/26/18 0517 02/27/18 0631 02/28/18 0558 03/01/18 0559  NA 140 140 143 143 148*  K 4.5 3.8 3.9 3.9 3.9  CL 113* 113* 114* 114* 117*  CO2 17* 20* 16* 18* 20*  GLUCOSE 107* 94 78 88 74  BUN 12 13 15 17 18   CREATININE 1.30* 0.99 0.86 0.96 0.89  CALCIUM 8.7* 8.7* 8.7* 8.7* 8.8*  MG  --   --   --  1.9  --    GFR: Estimated Creatinine Clearance: 51.1 mL/min (by C-G formula based on SCr of 0.89 mg/dL). Liver Function Tests: No results for input(s): AST, ALT, ALKPHOS, BILITOT, PROT, ALBUMIN in the last 168 hours. No results for input(s): LIPASE, AMYLASE in the last 168 hours. No results for input(s): AMMONIA in the last 168 hours. Coagulation Profile: Recent Labs  Lab 02/27/18 1300  INR 1.16   Cardiac Enzymes: No results for input(s): CKTOTAL, CKMB, CKMBINDEX, TROPONINI in the last 168 hours. BNP (last 3 results) No results for input(s): PROBNP in the last 8760 hours. HbA1C: No results for input(s): HGBA1C in the last 72 hours. CBG: No results for input(s): GLUCAP in the last 168 hours. Lipid Profile: No results for input(s): CHOL, HDL, LDLCALC, TRIG, CHOLHDL, LDLDIRECT in the last 72 hours. Thyroid Function Tests: No  results for input(s): TSH, T4TOTAL, FREET4, T3FREE, THYROIDAB in the last 72 hours. Anemia Panel: No results for input(s): VITAMINB12, FOLATE, FERRITIN, TIBC, IRON, RETICCTPCT in the last 72 hours. Sepsis Labs: No results for input(s): PROCALCITON, LATICACIDVEN in the last 168 hours.  Recent Results (from the past 240 hour(s))  Urine culture     Status: Abnormal   Collection Time: 02/10/2018  1:56 PM  Result Value Ref Range Status   Specimen Description URINE, RANDOM  Final   Special Requests   Final    NONE Performed at [facility], [address] [place], [place] [address]    Culture >=100,000 COLONIES/mL KLEBSIELLA PNEUMONIAE (A)  Final   Report Status 02/22/2018 FINAL  Final   Organism ID, Bacteria KLEBSIELLA PNEUMONIAE (A)  Final      Susceptibility   Klebsiella pneumoniae - MIC*    AMPICILLIN RESISTANT Resistant     CEFAZOLIN <=4 SENSITIVE Sensitive     CEFTRIAXONE <=1 SENSITIVE Sensitive     CIPROFLOXACIN <=0.25 SENSITIVE Sensitive     GENTAMICIN <=1 SENSITIVE Sensitive     IMIPENEM <=0.25 SENSITIVE Sensitive     NITROFURANTOIN <=16 SENSITIVE Sensitive     TRIMETH/SULFA <=20 SENSITIVE Sensitive  AMPICILLIN/SULBACTAM 4 SENSITIVE Sensitive     PIP/TAZO <=4 SENSITIVE Sensitive     Extended ESBL NEGATIVE Sensitive     * >=100,000 COLONIES/mL KLEBSIELLA PNEUMONIAE  Culture, Urine     Status: None   Collection Time: 02/26/18  1:35 PM  Result Value Ref Range Status   Specimen Description   Final    URINE, CLEAN CATCH Performed at [facility], [place] [address] [place], [place] [address]    Special Requests   Final    NONE Performed at [facility], [place] [address] [place], [place] [address]    Culture   Final    NO GROWTH Performed at [facility], [place] [address] [place], [place] [address]    Report Status 02/27/2018 FINAL  Final         Radiology Studies: Dg Abd 1 View  Result Date: 02/27/2018 CLINICAL  DATA:  Follow-up ileus. Current history of large sarcoma involving the LEFT side of the abdomen and pelvis extending into the proximal LEFT thigh. EXAM: ABDOMEN - 1 VIEW COMPARISON:  KUB 02/26/2018, 02/25/2018. CT abdomen and pelvis 02/24/2018. FINDINGS: Persistent moderately distended loops of small bowel, unchanged, displaced into the mid abdomen and RIGHT UPPER QUADRANT due to the large sarcoma as identified on the recent CT. Contrast material within the colon, though the distribution of the contrast has not significantly changed over the past 2 days, indicating adynamic ileus. BILATERAL double-J ureteral stents as noted previously, unchanged in position. Nasogastric tube with its tip in the body of the stomach. IMPRESSION: 1. Stable ileus. 2. Ileus is confirmed by the fact that the contrast material in the colon has not significantly progressed over the past 2 days, indicating an adynamic state. 3. Nasogastric tube tip in the body of the stomach. Electronically Signed   By: Evangeline Dakin M.D.   On: 02/27/2018 12:37   Ir Gastrostomy Tube Mod Sed  Result Date: 02/28/2018 INDICATION: History of recurrent retroperitoneal sarcoma now with enteric obstruction. Request made for placement gastrostomy tube for gastric venting purposes. EXAM: PULL TROUGH GASTROSTOMY TUBE PLACEMENT COMPARISON:  CT abdomen and pelvis-02/11/2018 MEDICATIONS: The patient is currently admitted to the hospital receiving intravenous antibiotics; Antibiotics were administered within 1 hour of the procedure. Glucagon 1 mg IV CONTRAST:  20 cc Isovue-300 administered into the gastric lumen. ANESTHESIA/SEDATION: Moderate (conscious) sedation was employed during this procedure. A total of Versed 3 mg and Dilaudid 2 mg was administered intravenously. Moderate Sedation Time: 16 minutes. The patient's level of consciousness and vital signs were monitored continuously by radiology nursing throughout the procedure under my direct supervision.  FLUOROSCOPY TIME:  4 minutes 42 seconds (63 mGy) COMPLICATIONS: None immediate. PROCEDURE: Informed written consent was obtained from the patient following explanation of the procedure, risks, benefits and alternatives. A time out was performed prior to the initiation of the procedure. Ultrasound scanning was performed to demarcate the edge of the left lobe of the liver. Maximal barrier sterile technique utilized including caps, mask, sterile gowns, sterile gloves, large sterile drape, hand hygiene and Betadine prep. The left upper quadrant was sterilely prepped and draped. An oral gastric catheter was inserted into the stomach under fluoroscopy. The existing nasogastric feeding tube was removed. The left costal margin and air opacified transverse colon were identified and avoided. Air was injected into the stomach for insufflation and visualization under fluoroscopy. Under sterile conditions a 17 gauge trocar needle was utilized to access the stomach percutaneously beneath the left subcostal margin  after the overlying soft tissues were anesthetized with 1% Lidocaine with epinephrine. Needle position was confirmed within the stomach with aspiration of air and injection of small amount of contrast. A single T tack was deployed for gastropexy. Over an Amplatz guide wire, a 9-French sheath was inserted into the stomach. A snare device was utilized to capture the oral gastric catheter. The snare device was pulled retrograde from the stomach up the esophagus and out the oropharynx. The 20-French pull-through gastrostomy was connected to the snare device and pulled antegrade through the oropharynx down the esophagus into the stomach and then through the percutaneous tract external to the patient. The gastrostomy was assembled externally. Contrast injection confirms position in the stomach. Several spot radiographic images were obtained in various obliquities for documentation. The patient tolerated procedure well without  immediate post procedural complication. FINDINGS: After successful fluoroscopic guided placement, the gastrostomy tube is appropriately positioned with internal disc against the ventral aspect of the gastric lumen. IMPRESSION: Successful fluoroscopic insertion of a 20-French pull-through gastrostomy tube. The gastrostomy may be used immediately for venting and/or medication administration and in 24 hrs for the initiation of feeds. Electronically Signed   By: Sandi Mariscal M.D.   On: 02/28/2018 16:18        Scheduled Meds: . DOXOrubicin (ADRIAMYCIN) CHEMO for Inpatient continous infusion  20 mg/m2 (Treatment Plan Recorded) Intravenous Once  . enoxaparin (LOVENOX) injection  40 mg Subcutaneous Q24H  . HYDROmorphone   Intravenous Q4H  . ifosfamide (IFEX) CHEMO infusion  1,500 mg/m2 (Treatment Plan Recorded) Intravenous Once  . metoprolol tartrate  5 mg Intravenous Q6H  . palonosetron  0.25 mg Intravenous Once  . vitamin A & D       Continuous Infusions: . sodium chloride    . sodium chloride    .  ceFAZolin (ANCEF) IV Stopped (03/01/18 9774)  . D5W + NA ACETATE + K ACETATE + MGSO4 IV AIM hydration    . fosaprepitant (EMEND) IV infusion 150 mg + dexamethasone    . mesna    . mesna    . ondansetron (ZOFRAN) IV       LOS: 9 days    Time spent: Merna, MD [facility]  If 7PM-7AM, please contact night-coverage [email or web address] Password [facility] 03/01/2018, 9:34 AM

## 2018-03-02 DIAGNOSIS — R109 Unspecified abdominal pain: Secondary | ICD-10-CM

## 2018-03-02 DIAGNOSIS — E870 Hyperosmolality and hypernatremia: Secondary | ICD-10-CM

## 2018-03-02 LAB — COMPREHENSIVE METABOLIC PANEL
ALT: <5 U/L (ref 0–44)
AST: 14 U/L — ABNORMAL LOW (ref 15–41)
Alkaline Phosphatase: 52 U/L (ref 38–126)
Calcium: 8.4 mg/dL — ABNORMAL LOW (ref 8.9–10.3)
Creatinine, Ser: 0.78 mg/dL (ref 0.44–1.00)
GFR calc Af Amer: >60 mL/min (ref >60)
GFR calc non Af Amer: >60 mL/min (ref >60)
Total Bilirubin: 0.3 mg/dL (ref 0.3–1.2)
Total Protein: 5.4 g/dL — ABNORMAL LOW (ref 6.5–8.1)

## 2018-03-02 LAB — COMPREHENSIVE METABOLIC PANEL WITH GFR
Albumin: 2.2 g/dL — ABNORMAL LOW (ref 3.5–5.0)
Anion gap: 12 (ref 5–15)
BUN: 15 mg/dL (ref 6–20)
CO2: 33 mmol/L — ABNORMAL HIGH (ref 22–32)
Chloride: 107 mmol/L (ref 98–111)
Glucose, Bld: 125 mg/dL — ABNORMAL HIGH (ref 70–99)
Potassium: 4.3 mmol/L (ref 3.5–5.1)
Sodium: 152 mmol/L — ABNORMAL HIGH (ref 135–145)

## 2018-03-02 LAB — MAGNESIUM: Magnesium: 2.1 mg/dL (ref 1.7–2.4)

## 2018-03-02 MED ORDER — SODIUM CHLORIDE 0.9 % IV SOLN
20.0000 mg/m2 | Freq: Once | INTRAVENOUS | Status: AC
  Administered 2018-03-02: 30 mg via INTRAVENOUS
  Filled 2018-03-02: qty 15

## 2018-03-02 MED ORDER — SODIUM CHLORIDE 0.9 % IV SOLN
1500.0000 mg/m2 | Freq: Once | INTRAVENOUS | Status: AC
  Administered 2018-03-02: 2200 mg via INTRAVENOUS
  Filled 2018-03-02: qty 44

## 2018-03-02 MED ORDER — SODIUM ACETATE 2 MEQ/ML IV SOLN
INTRAVENOUS | Status: AC
  Administered 2018-03-02 (×3): via INTRAVENOUS
  Filled 2018-03-02 (×4): qty 20

## 2018-03-02 MED ORDER — SODIUM ACETATE 2 MEQ/ML IV SOLN
INTRAVENOUS | Status: DC
  Filled 2018-03-02 (×3): qty 60

## 2018-03-02 MED ORDER — HYDROMORPHONE HCL 1 MG/ML IJ SOLN
1.0000 mg | INTRAMUSCULAR | Status: DC | PRN
  Administered 2018-03-02 – 2018-03-03 (×2): 1 mg via INTRAVENOUS
  Filled 2018-03-02 (×2): qty 1

## 2018-03-02 MED ORDER — SODIUM CHLORIDE 0.9 % IV SOLN
20.0000 mg | Freq: Once | INTRAVENOUS | Status: AC
  Administered 2018-03-02: 20 mg via INTRAVENOUS
  Filled 2018-03-02: qty 2

## 2018-03-02 MED ORDER — FUROSEMIDE 10 MG/ML IJ SOLN
20.0000 mg | Freq: Once | INTRAMUSCULAR | Status: AC
  Administered 2018-03-02: 20 mg via INTRAVENOUS
  Filled 2018-03-02: qty 2

## 2018-03-02 MED ORDER — MESNA 100 MG/ML IV SOLN
300.0000 mg/m2 | INTRAVENOUS | Status: AC
  Administered 2018-03-02 (×2): 450 mg via INTRAVENOUS
  Filled 2018-03-02 (×2): qty 4.5

## 2018-03-02 MED ORDER — SODIUM CHLORIDE 0.9 % IV SOLN
300.0000 mg/m2 | Freq: Once | INTRAVENOUS | Status: AC
  Administered 2018-03-02: 450 mg via INTRAVENOUS
  Filled 2018-03-02: qty 4.5

## 2018-03-02 NOTE — Progress Notes (Signed)
PROGRESS NOTE    Anita Whitehead  RFX:[id number] DOB: [birth year] DOA: 03/05/2018 PCP: [name], NP    Brief Narrative:  62 year old with past medical history relevant for remote history of cervical cancer status post radiation, hypertension, hyperlipidemia, known left-sided high-grade sarcoma who was admitted with partial small bowel obstruction to [facility] on 02/11/2018 and was noted to have mild AKI and bilateral left greater than right hydronephrosis.  She initially had mild improvement of her partial small bowel obstruction as she was not deemed a surgical candidate by general surgery and was tolerating clear liquids.  She was transferred to [facility] on 02/23/2018 and on 03/02/2018 received bilateral ureteral stents.  She is currently now n.p.o. due to complete bowel obstruction and has an NG tube and is status post venting gastrostomy.  She is currently getting chemotherapy in the hopes of improving her bowel obstruction so that she can take p.o.   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 -Status post venting PEG tube by interventional radiology on 02/28/2018, currently to straight drain with bilious material, PEG tube can be used for medications - Increase half-normal saline due to worsening hypernatremia -Palliative care following appreciate recommendations, discontinue PCA to oral and t sentinel patch -Oncology following appreciate recommendations, plan is for palliative chemotherapy with doxorubicin with ifosfamide and mesna -  Patient will get 1 dose of IV furosemide due to lower extremity edema left greater than right  #) Bilateral hydronephrosis, complicated by UTI: Status post bilateral ureteral stents on 02/17/2018 -Urine culture from 02/12/2018 growing a Klebsiella that is pansensitive -Repeat urine culture from 02/26/2018 no growth to date -Continue cefazolin started 03/06/2018 -Urology consult, appreciate recommendations  #) Anemia: Likely related to malignancy and iron deficiency. -Status post a dose of IV iron on 02/22/2018 and 02/26/2018  #) Hypertension/tachycardia: Improved with IV metoprolol -Patient is on carvedilol 12.5 mg twice daily at home, -Continue IV metoprolol tartrate's 5 mg every 6 hours here  Fluids: Half-normal saline Elect lites: Monitor and supplement Nutrition: N.p.o. except for ice chips  Prophylaxis: Enoxaparin  Disposition: Pending palliative chemotherapy  Full code  Consultants:   Palliative care  Oncology  Urology  Interventional radiology  Procedures:   03/01/2018 status post bilateral ureteral stents  Antimicrobials:   Continue cefazolin started 02/27/2018   Subjective: Patient reports that she is currently not in pain at all.  This morning when she woke up she was noted to be slightly confused and she feels overmedicated with pain medication.  She is requesting discontinuation of at least some of her PCA as she feels like she has no pain and it is adversely affecting her.  Objective: Vitals:   03/02/18 0515 03/02/18 0947 03/02/18 0953 03/02/18 1233  BP: (!) 127/91 137/85    Pulse: (!) 105 (!) 107    Resp: 10 12 12 12   Temp: 98 F (36.7 C) 98 F (36.7 C)    TempSrc: Oral Oral    SpO2: 97% 100% 100% 100%  Weight:      Height:  Intake/Output Summary (Last 24 hours) at 03/02/2018 1324 Last data filed at 03/02/2018 0951 Gross per 24 hour  Intake 4027.84 ml  Output 4800 ml  Net -772.16 ml   Filed Weights   02/12/2018 1121 02/12/2018 2300  Weight:  48.5 kg (107 lb) 47.6 kg (104 lb 15 oz)    Examination:  General exam: Appears calm and comfortable  Respiratory system: Clear to auscultation. Respiratory effort normal. Cardiovascular system: Tachycardic, regular rhythm, no murmurs Gastrointestinal system: Distended, tender to palpation, no rebound or guarding, no bowel sounds Central nervous system: Alert and oriented. No focal neurological deficits. Extremities: Left greater than right 2+ lower extremity edema Skin: Picc site is clean dry and intact, venting G-tube site is clean dry and intact Psychiatry: Judgement and insight appear normal. Mood & affect appropriate.     Data Reviewed: I have personally reviewed following labs and imaging studies  CBC: Recent Labs  Lab 02/25/18 0522 02/26/18 0517 02/27/18 0631 02/28/18 0558 03/01/18 0559  WBC 16.9* 16.7* 27.8* 27.6* 27.1*  NEUTROABS  --   --   --   --  25.0*  HGB 9.0* 9.7* 9.2* 8.5* 8.2*  HCT 27.9* 29.9* 28.2* 25.9* 24.8*  MCV 77.5* 78.1 78.1 78.2 79.2  PLT 575* 550* 525* 458* 979   Basic Metabolic Panel: Recent Labs  Lab 02/26/18 0517 02/27/18 0631 02/28/18 0558 03/01/18 0559 03/02/18 0535  NA 140 143 143 148* 152*  K 3.8 3.9 3.9 3.9 4.3  CL 113* 114* 114* 117* 107  CO2 20* 16* 18* 20* 33*  GLUCOSE 94 78 88 74 125*  BUN 13 15 17 18 15   CREATININE 0.99 0.86 0.96 0.89 0.78  CALCIUM 8.7* 8.7* 8.7* 8.8* 8.4*  MG  --   --  1.9  --  2.1   GFR: Estimated Creatinine Clearance: 56.9 mL/min (by C-G formula based on SCr of 0.78 mg/dL). Liver Function Tests: Recent Labs  Lab 03/02/18 0535  AST 14*  ALT <5  ALKPHOS 52  BILITOT 0.3  PROT 5.4*  ALBUMIN 2.2*   No results for input(s): LIPASE, AMYLASE in the last 168 hours. No results for input(s): AMMONIA in the last 168 hours. Coagulation Profile: Recent Labs  Lab 02/27/18 1300  INR 1.16   Cardiac Enzymes: No results for input(s): CKTOTAL, CKMB, CKMBINDEX, TROPONINI in the last 168 hours. BNP (last 3  results) No results for input(s): PROBNP in the last 8760 hours. HbA1C: No results for input(s): HGBA1C in the last 72 hours. CBG: No results for input(s): GLUCAP in the last 168 hours. Lipid Profile: No results for input(s): CHOL, HDL, LDLCALC, TRIG, CHOLHDL, LDLDIRECT in the last 72 hours. Thyroid Function Tests: No results for input(s): TSH, T4TOTAL, FREET4, T3FREE, THYROIDAB in the last 72 hours. Anemia Panel: No results for input(s): VITAMINB12, FOLATE, FERRITIN, TIBC, IRON, RETICCTPCT in the last 72 hours. Sepsis Labs: No results for input(s): PROCALCITON, LATICACIDVEN in the last 168 hours.  Recent Results (from the past 240 hour(s))  Urine culture     Status: Abnormal   Collection Time: 02/23/2018  1:56 PM  Result Value Ref Range Status   Specimen Description URINE, RANDOM  Final   Special Requests   Final    NONE Performed at [facility], [address] [place], [place] [address]    Culture >=100,000 COLONIES/mL KLEBSIELLA PNEUMONIAE (A)  Final   Report Status 02/22/2018 FINAL  Final   Organism ID, Bacteria KLEBSIELLA PNEUMONIAE (A)  Final  Susceptibility   Klebsiella pneumoniae - MIC*    AMPICILLIN RESISTANT Resistant     CEFAZOLIN <=4 SENSITIVE Sensitive     CEFTRIAXONE <=1 SENSITIVE Sensitive     CIPROFLOXACIN <=0.25 SENSITIVE Sensitive     GENTAMICIN <=1 SENSITIVE Sensitive     IMIPENEM <=0.25 SENSITIVE Sensitive     NITROFURANTOIN <=16 SENSITIVE Sensitive     TRIMETH/SULFA <=20 SENSITIVE Sensitive     AMPICILLIN/SULBACTAM 4 SENSITIVE Sensitive     PIP/TAZO <=4 SENSITIVE Sensitive     Extended ESBL NEGATIVE Sensitive     * >=100,000 COLONIES/mL KLEBSIELLA PNEUMONIAE  Culture, Urine     Status: None   Collection Time: 02/26/18  1:35 PM  Result Value Ref Range Status   Specimen Description   Final    URINE, CLEAN CATCH Performed at [facility] [address] [place], [place] [address]    Special Requests   Final     NONE Performed at [facility], [place] [address] [place], [place] [address]    Culture   Final    NO GROWTH Performed at [facility], [place] [address] [place], [place] [address]    Report Status 02/27/2018 FINAL  Final         Radiology Studies: Ir Gastrostomy Tube Mod Sed  Result Date: 02/28/2018 INDICATION: History of recurrent retroperitoneal sarcoma now with enteric obstruction. Request made for placement gastrostomy tube for gastric venting purposes. EXAM: PULL TROUGH GASTROSTOMY TUBE PLACEMENT COMPARISON:  CT abdomen and pelvis-02/16/2018 MEDICATIONS: The patient is currently admitted to the hospital receiving intravenous antibiotics; Antibiotics were administered within 1 hour of the procedure. Glucagon 1 mg IV CONTRAST:  20 cc Isovue-300 administered into the gastric lumen. ANESTHESIA/SEDATION: Moderate (conscious) sedation was employed during this procedure. A total of Versed 3 mg and Dilaudid 2 mg was administered intravenously. Moderate Sedation Time: 16 minutes. The patient's level of consciousness and vital signs were monitored continuously by radiology nursing throughout the procedure under my direct supervision. FLUOROSCOPY TIME:  4 minutes 42 seconds (63 mGy) COMPLICATIONS: None immediate. PROCEDURE: Informed written consent was obtained from the patient following explanation of the procedure, risks, benefits and alternatives. A time out was performed prior to the initiation of the procedure. Ultrasound scanning was performed to demarcate the edge of the left lobe of the liver. Maximal barrier sterile technique utilized including caps, mask, sterile gowns, sterile gloves, large sterile drape, hand hygiene and Betadine prep. The left upper quadrant was sterilely prepped and draped. An oral gastric catheter was inserted into the stomach under fluoroscopy. The existing nasogastric feeding tube was removed. The left costal margin and air opacified transverse colon  were identified and avoided. Air was injected into the stomach for insufflation and visualization under fluoroscopy. Under sterile conditions a 17 gauge trocar needle was utilized to access the stomach percutaneously beneath the left subcostal margin after the overlying soft tissues were anesthetized with 1% Lidocaine with epinephrine. Needle position was confirmed within the stomach with aspiration of air and injection of small amount of contrast. A single T tack was deployed for gastropexy. Over an Amplatz guide wire, a 9-French sheath was inserted into the stomach. A snare device was utilized to capture the oral gastric catheter. The snare device was pulled retrograde from the stomach up the esophagus and out the oropharynx. The 20-French pull-through gastrostomy was connected to the snare device and pulled antegrade through the oropharynx down the esophagus into the stomach and then through the percutaneous  tract external to the patient. The gastrostomy was assembled externally. Contrast injection confirms position in the stomach. Several spot radiographic images were obtained in various obliquities for documentation. The patient tolerated procedure well without immediate post procedural complication. FINDINGS: After successful fluoroscopic guided placement, the gastrostomy tube is appropriately positioned with internal disc against the ventral aspect of the gastric lumen. IMPRESSION: Successful fluoroscopic insertion of a 20-French pull-through gastrostomy tube. The gastrostomy may be used immediately for venting and/or medication administration and in 24 hrs for the initiation of feeds. Electronically Signed   By: Sandi Mariscal M.D.   On: 02/28/2018 16:18        Scheduled Meds: . DOXOrubicin (ADRIAMYCIN) CHEMO for Inpatient continous infusion  20 mg/m2 (Treatment Plan Recorded) Intravenous Once  . DOXOrubicin (ADRIAMYCIN) CHEMO for Inpatient continous infusion  20 mg/m2 (Treatment Plan Recorded)  Intravenous Once  . enoxaparin (LOVENOX) injection  40 mg Subcutaneous Q24H  . fentaNYL  200 mcg Transdermal Q72H  . ifosfamide (IFEX) CHEMO infusion  1,500 mg/m2 (Treatment Plan Recorded) Intravenous Once  . metoprolol tartrate  5 mg Intravenous Q6H   Continuous Infusions: . sodium chloride 125 mL/hr at 03/02/18 0952  . sodium chloride 10 mL/hr at 03/01/18 1055  .  ceFAZolin (ANCEF) IV Stopped (03/02/18 1022)  . D5W + NA ACETATE + K ACETATE + MGSO4 IV AIM hydration 500 mL/hr at 03/02/18 1058  . mesna    . ondansetron (ZOFRAN) IV       LOS: 10 days    Time spent: [place], MD [facility]  If 7PM-7AM, please contact night-coverage [email or web address] Password [email or web address] 03/02/2018, 1:24 PM

## 2018-03-02 NOTE — Progress Notes (Signed)
IP PROGRESS NOTE  Subjective:   Anita Whitehead feels reasonably well this morning without any complaints.  She received the first day 1 of chemotherapy on 03/01/2018 without any complications.  She denies any nausea, vomiting or infusion related complications.  She does report bilateral lower extremity edema.  She is spending most of the day in a chair without any elevation of her feet.  She was not able to participate in physical therapy yesterday because of start chemotherapy.  His pain is adequately controlled at this time.  She does not report any headaches, blurry vision, syncope or seizures.  She denies any confusion or alteration of mental status.  Does not report any fevers, chills or sweats.  Does not report any cough, wheezing or hemoptysis.  Does not report any chest pain, palpitation, orthopnea.   Does not report frequency, urgency or hematuria.  Does not report any skin rashes or lesions. Remaining review of systems is negative.    Objective:  Vital signs in last 24 hours: Temp:  [98 F (36.7 C)-98.6 F (37 C)] 98 F (36.7 C) (07/27 0515) Pulse Rate:  [102-116] 105 (07/27 0515) Resp:  [10-17] 10 (07/27 0515) BP: (112-140)/(67-95) 127/91 (07/27 0515) SpO2:  [96 %-100 %] 97 % (07/27 0515) Weight change:  Last BM Date: 03/06/2018  Intake/Output from previous day: 07/26 0701 - 07/27 0700 In: 4537.8 [I.V.:2470.4; IV Piggyback:2057.4] Out: 0258 [Urine:600; Drains:3450] General: Alert, awake without distress. Head: Normocephalic atraumatic. Mouth: mucous membranes moist, pharynx normal without lesions Eyes: No scleral icterus.  Pupils are equal and round reactive to light. Resp: clear to auscultation bilaterally without rhonchi or wheezes or dullness to percussion. Cardio: regular rate and rhythm, S1, S2 normal.  GI: soft, non-tender; slightly distended with decreased bowel sounds.  Musculoskeletal: No joint deformity or effusion. Neurological: No motor, sensory deficits.  Intact  deep tendon reflexes. Skin: No rashes or lesions.   Lab Results: Recent Labs    02/28/18 0558 03/01/18 0559  WBC 27.6* 27.1*  HGB 8.5* 8.2*  HCT 25.9* 24.8*  PLT 458* 379    BMET Recent Labs    03/01/18 0559 03/02/18 0535  NA 148* 152*  K 3.9 4.3  CL 117* 107  CO2 20* 33*  GLUCOSE 74 125*  BUN 18 15  CREATININE 0.89 0.78  CALCIUM 8.8* 8.4*     Medications: I have reviewed the patient's current medications.  Assessment/Plan:  62 year old woman with the following:  1.  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  He presented with a large pelvic mass measuring 9.6 x 6.3 x 11.9 in October 2018.  She underwent a gross resection of her disease in April 2019 and a final pathology revealed a retroperitoneal sarcoma malignant sheath pathology.  He developed recurrent disease in July 2019 with a bowel obstruction and bilateral hydronephrosis.  She is getting salvage chemotherapy utilizing Adriamycin, ifosfamide with mesna.  She received a day 1 of cycle 1 of chemotherapy on 01/01/59 without complications.  She is reporting crease edema however.  Laboratory data reviewed and appeared adequate she will proceed with day 2 without any dose reduction or delay.  2.  Lower extremity edema: Related to excessive fluid administration as well as positional dependent edema.  I recommend given her Lasix given the amount of fluid she is receiving with chemotherapy.  3.  Hypernatremia: Anticipate related to free water deficit in addition to increase saline infusion associated with her chemotherapy fluids.  We will decrease amount of salt in her IV fluid and monitor electrolytes daily.  4.  Abdominal pain: Manageable with PCA pump.  Her pain is related to malignant obstruction bowels.  5.  Disposition: Not ready for discharge at this time.  25  minutes was spent with the patient face-to-face today.  More than  50% of time was dedicated to patient counseling, education and reviewing her medical records, imaging studies and discussion with her care team including pharmacy.      LOS: 10 days   Anita Whitehead 03/02/2018, 8:34 AM
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
## 2018-03-03 ENCOUNTER — Inpatient Hospital Stay (HOSPITAL_COMMUNITY): Payer: 59

## 2018-03-03 DIAGNOSIS — R410 Disorientation, unspecified: Secondary | ICD-10-CM

## 2018-03-03 LAB — COMPREHENSIVE METABOLIC PANEL WITH GFR
Albumin: 2.2 g/dL — ABNORMAL LOW (ref 3.5–5.0)
Anion gap: 17 — ABNORMAL HIGH (ref 5–15)
GFR calc non Af Amer: >60 mL/min (ref >60)
Potassium: 4.7 mmol/L (ref 3.5–5.1)
Total Bilirubin: 0.2 mg/dL — ABNORMAL LOW (ref 0.3–1.2)
Total Protein: 5.7 g/dL — ABNORMAL LOW (ref 6.5–8.1)

## 2018-03-03 LAB — CBC WITH DIFFERENTIAL/PLATELET
Basophils Absolute: 0 10*3/uL (ref 0.0–0.1)
Basophils Relative: 0 %
Eosinophils Absolute: 0 10*3/uL (ref 0.0–0.7)
Eosinophils Relative: 0 %
HCT: 23 % — ABNORMAL LOW (ref 36.0–46.0)
Hemoglobin: 7.7 g/dL — ABNORMAL LOW (ref 12.0–15.0)
Lymphocytes Relative: 6 %
Lymphs Abs: 1.3 10*3/uL (ref 0.7–4.0)
MCH: 26.4 pg (ref 26.0–34.0)
MCHC: 33.5 g/dL (ref 30.0–36.0)
MCV: 78.8 fL (ref 78.0–100.0)
Monocytes Absolute: 0.8 K/uL (ref 0.1–1.0)
Monocytes Relative: 4 %
Neutro Abs: 18.8 10*3/uL — ABNORMAL HIGH (ref 1.7–7.7)
Neutrophils Relative %: 90 %
Platelets: 350 10*3/uL (ref 150–400)
RBC: 2.92 MIL/uL — ABNORMAL LOW (ref 3.87–5.11)
RDW: 22 % — ABNORMAL HIGH (ref 11.5–15.5)
WBC Morphology: INCREASED
WBC: 20.9 K/uL — ABNORMAL HIGH (ref 4.0–10.5)

## 2018-03-03 LAB — COMPREHENSIVE METABOLIC PANEL
ALT: 6 U/L (ref 0–44)
AST: 23 U/L (ref 15–41)
Alkaline Phosphatase: 56 U/L (ref 38–126)
BUN: 16 mg/dL (ref 6–20)
CO2: 34 mmol/L — ABNORMAL HIGH (ref 22–32)
Calcium: 8.3 mg/dL — ABNORMAL LOW (ref 8.9–10.3)
Chloride: 102 mmol/L (ref 98–111)
Creatinine, Ser: 0.93 mg/dL (ref 0.44–1.00)
GFR calc Af Amer: >60 mL/min (ref >60)
Glucose, Bld: 110 mg/dL — ABNORMAL HIGH (ref 70–99)
Sodium: 153 mmol/L — ABNORMAL HIGH (ref 135–145)

## 2018-03-03 LAB — MAGNESIUM: Magnesium: 2.3 mg/dL (ref 1.7–2.4)

## 2018-03-03 LAB — PROCALCITONIN: Procalcitonin: 0.7 ng/mL

## 2018-03-03 LAB — AMMONIA: Ammonia: 16 umol/L (ref 9–35)

## 2018-03-03 MED ORDER — DEXTROSE 5 % IV SOLN
INTRAVENOUS | Status: DC
  Administered 2018-03-03 (×2): via INTRAVENOUS
  Filled 2018-03-03 (×2): qty 1000

## 2018-03-03 MED ORDER — SODIUM CHLORIDE 0.9 % IV SOLN
20.0000 mg/m2 | Freq: Once | INTRAVENOUS | Status: AC
  Administered 2018-03-03: 30 mg via INTRAVENOUS
  Filled 2018-03-03: qty 15

## 2018-03-03 MED ORDER — DEXAMETHASONE SODIUM PHOSPHATE 10 MG/ML IJ SOLN
20.0000 mg | Freq: Once | INTRAMUSCULAR | Status: AC
  Administered 2018-03-03: 20 mg via INTRAVENOUS
  Filled 2018-03-03: qty 2

## 2018-03-03 MED ORDER — HYDROMORPHONE HCL 1 MG/ML IJ SOLN: 0.5000 mg | INTRAMUSCULAR | Status: DC | PRN

## 2018-03-03 MED ORDER — HYDROMORPHONE HCL 1 MG/ML IJ SOLN
1.0000 mg | INTRAMUSCULAR | Status: DC | PRN
  Administered 2018-03-03 – 2018-03-04 (×7): 1 mg via INTRAVENOUS
  Filled 2018-03-03 (×8): qty 1

## 2018-03-03 MED ORDER — ALBUMIN HUMAN 25 % IV SOLN
50.0000 g | Freq: Once | INTRAVENOUS | Status: AC
  Administered 2018-03-03: 50 g via INTRAVENOUS
  Filled 2018-03-03: qty 200

## 2018-03-03 MED ORDER — PALONOSETRON HCL INJECTION 0.25 MG/5ML
0.2500 mg | Freq: Once | INTRAVENOUS | Status: AC
  Administered 2018-03-03: 0.25 mg via INTRAVENOUS
  Filled 2018-03-03: qty 5

## 2018-03-03 NOTE — Progress Notes (Signed)
Received page about change in patient's mental status.  Overnight, patient has had decrease in orientation.  Patient with repetitive word usage.  Patient has received 1 dose of Dilaudid this morning.  She was started on fentanyl patch 20 mcg 2 days ago.  She is previous on PCA which has been discontinued.  She is recently started on chemotherapy, doxorubicin and ifosfamide.  Vitals:   03/03/18 0440 03/03/18 0513  BP: (!) 145/98   Pulse: (!) 116   Resp: 20   Temp: 99 F (37.2 C) 98.4 F (36.9 C)  SpO2: 94%    Neuro: Cranial nerves intact.  Strength is 5 out of 5 in upper extremity and 3 out of 5 in lower extremity.  Reflexes are equal bilaterally and intact.  Patient is alert and oriented to person.  She thought she was at [facility] but knew she was in the hospital.  She was aware of her husband but had trouble identifying her son.  G-tube site appears clean. Abdomen: Soft.  Bowel sounds heard.  Nontender with no guarding or rebound.  Distended.  Assessment/plan: Confusion Continue stat CT scan. Oncology to discontinue ifosfamide. Low concern for acute neurolo G-tube site appears Gic event. Ammonia normal. Seems this is likely medication related. Elevated serum CO2 in setting of high dose opiates, however, patient is alert and semi-oriented and on room air and with normal respirations and effort. Will forego ABG at this time.   Cordelia Poche, MD [facility] 03/03/2018, 8:30 AM

## 2018-03-03 NOTE — Progress Notes (Signed)
At beginning of shift, this RN noted that pt seemed to have trouble organizing her thoughts and was somewhat repetitive. However she was A&Ox4. She went to sleep, awoke around 2345 and received a dose of dilaudid 1mg  for abdominal pain. At 0440, NT reported that pt "wasn't acting right" and seemed out of it. Pt sitting in chair, saying "yeah" repeatedly. Unable to answer questions such as name, location; unable to follow commands. Generalized shaking/shivering noted. Paged rapid response and on call NP, both arrived at bedside to assess. See notes from each for further details. Pt assisted back to bed with son remaining at bedside. Pt seemed to be coming around to some degree, although still saying "yeah" and answering some questions incorrectly. She seems aware that she is having trouble with her mental functioning. Continue to monitor per NP/RR recommendations. Hortencia Conradi RN

## 2018-03-03 NOTE — Progress Notes (Signed)
Daily Progress Note   Patient Name: Anita Whitehead       Date: 03/03/2018 DOB: Feb 01, 1956  Age: 62 y.o. MRN#: [id number] Attending Physician: Cristy Folks, MD Primary Care Physician: [name], NP Admit Date: 02/07/2018  Reason for Consultation/Follow-up: Pain control  Subjective: Anita Whitehead is sitting up in recliner, over all she is awake, she is able to recognize family present in the room, she is some what confused, appears anxious.    Length of Stay: 11  Current Medications: Scheduled Meds:  . DOXOrubicin (ADRIAMYCIN) CHEMO for Inpatient continous infusion  20 mg/m2 (Treatment Plan Recorded) Intravenous Once  . DOXOrubicin (ADRIAMYCIN) CHEMO for Inpatient continous infusion  20 mg/m2 (Treatment Plan Recorded) Intravenous Once  . enoxaparin (LOVENOX) injection  40 mg Subcutaneous Q24H  . metoprolol tartrate  5 mg Intravenous Q6H  . palonosetron  0.25 mg Intravenous Once    Continuous Infusions: . sodium chloride 10 mL/hr at 03/01/18 1055  .  ceFAZolin (ANCEF) IV 1 g (03/03/18 1036)  . dexamethasone (DECADRON) IVPB [facility]    . dextrose 75 mL/hr at 03/03/18 0752  . ondansetron (ZOFRAN) IV      PRN Meds: alteplase, Cold Pack, heparin lock flush, heparin lock flush, hydrALAZINE, HYDROmorphone (DILAUDID) injection, iopamidol, [DISCONTINUED] ondansetron **OR** ondansetron (ZOFRAN) IV, prochlorperazine, sodium chloride flush, sodium chloride flush  Physical Exam  Constitutional: She is oriented to person, place, and time. She appears well-developed. She has a sickly appearance.  NGT  HENT:  Head: Normocephalic and atraumatic.  Cardiovascular: Tachycardia present.  Pulmonary/Chest: Effort normal. No accessory muscle usage. No tachypnea. No respiratory distress.  Abdominal:    Much less distended  Neurological: She is alert and oriented to person, place, and time.  Nursing note and vitals reviewed. has edema bilateral LE,  Some abdominal tenderness around venting PEG site   Vital Signs: BP (!) 145/98 (BP Location: Left Arm) Comment: RN notified   Pulse (!) 116 Comment: RN notified   Temp 98.4 F (36.9 C) (Oral)   Resp 20   Ht 5\' 3"  (1.6 m)   Wt 47.6 kg (104 lb 15 oz)   SpO2 94%   BMI 18.59 kg/m  SpO2: SpO2: 94 % O2 Device: O2 Device: Room Air O2 Flow Rate: O2 Flow Rate (L/min): 2 L/min  Intake/output summary:  Intake/Output Summary (Last 24 hours) at 03/03/2018 1045 Last data filed at 03/02/2018 1926 Gross per 24 hour  Intake 5623.76 ml  Output 1750 ml  Net 3873.76 ml   LBM: Last BM Date: 03/02/2018 Baseline Weight: Weight: 48.5 kg (107 lb) Most recent weight: Weight: 47.6 kg (104 lb 15 oz)       Palliative Assessment/Data:    Flowsheet Rows     Most Recent Value  Intake Tab  Referral Department  Hospitalist  Unit at Time of Referral  Oncology Unit  Palliative Care Primary Diagnosis  Cancer  Date Notified  02/26/18  Palliative Care Type  New Palliative care  Reason for referral  Clarify Goals of Care, Pain  Date of Admission  02/05/2018  Date first seen by Palliative Care  02/26/18  # of days Palliative referral response time  0 Day(s)  # of days IP prior to Palliative referral  6  Clinical Assessment  Psychosocial & Spiritual Assessment  Palliative Care Outcomes      Patient Active Problem List   Diagnosis Date Noted  . Sarcoma ([place])   . Dehydration with hyponatremia 02/21/2018  . Asymptomatic bacteriuria 02/21/2018  . Mononeuropathy of left lower limb 02/21/2018  . Partial small bowel obstruction ([place]) 02/04/2018  . Left leg weakness 12/04/2017  . Dehydration 10/19/2017  . Nausea with vomiting 10/19/2017  . Left leg swelling 07/13/2017  . Drug induced constipation 07/06/2017  . 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([place]) 06/15/2017  . 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 Essential hypertension 05/06/2014  . Atrophic vaginitis 04/02/2012  . HEARTBURN 02/24/2009  . 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 Hyperlipidemia 05/14/2007    Palliative Care Assessment & Plan   HPI: 62 y.o. 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 CT shows progressing sarcoma resulting in SBO. Bilateral ureteral stents placed 02/15/2018. Undergoing treatment of Klebsiella UTI.   Assessment: Anita Whitehead shows some confusion and change in mental status since the past 12 hours or so.    Has edema in both LE, also has some abdominal tightness Pain is well controlled. Venting PEG is draining well She denies nausea or vomiting.   Living Will has been filled out. Placed consult to chaplain services to help notarize to complete.   Recommendations/Plan:  Bowel Regimen: SBO, no recent BM, no response to enemas. Chemo 2nd dose has been adjusted.            L > R flank pain r/t pelvic/retroperitoneal sarcoma:  ? Dilaudid PCA has been discontinued.   ? Will discontinue Fentanyl 200 mcg/hr patch   ? Recommend morphine SL for breakthrough pain when patient is better able to absorb POs, for now, will lower dose of IV Dilaudid PRN for breakthrough pain.  ? Patient not necessarily opioid naive, she was on MS contin PO scheduled as well as hydrocodone at home. Discussed with family present at bedside, patient with advanced sarcoma, altered mental status. Will attempt to adjust opioids, discussed with [facility] MD about electrolyte imbalances and adjustments to her IVF today, appreciate Dr Hazeline Junker input regarding the patient's ifosfamide and adjustment of her chemo regimen.      Goals of Care and Additional Recommendations:  Limitations on Scope of Treatment: Full Scope Treatment  Code Status:  DNR  Prognosis:   to be determined    Discharge Planning:  To Be Determined  Care plan was discussed with patient, family members in the room.   Thank you for allowing the Palliative Medicine Team to assist in the care of this patient.   Total Time 35 min Prolonged Time Billed  no       Greater than 50%  of this time was spent counseling and coordinating care related to the above assessment and plan.  Loistine Chance MD [phone]  Palliative Medicine Team Pager # [phone] (M-F 8a-5p) Team Phone # [phone] (Nights/Weekends)

## 2018-03-03 NOTE — Progress Notes (Signed)
PROGRESS NOTE    RALEY NOVICKI  GMW:[id number] DOB: 14-Dec-1955 DOA: 02/21/2018 PCP: [name], NP    Brief Narrative:  62 year old with past medical history relevant for remote history of cervical cancer status post radiation, hypertension, hyperlipidemia, known left-sided high-grade sarcoma who was admitted with partial small bowel obstruction to [facility] on 02/25/2018 and was noted to have mild AKI and bilateral left greater than right hydronephrosis.  She initially had mild improvement of her partial small bowel obstruction as she was not deemed a surgical candidate by general surgery and was tolerating clear liquids.  She was transferred to [facility] on 02/23/2018 and on 02/16/2018 received bilateral ureteral stents.  She is currently now n.p.o. due to complete bowel obstruction and has an NG tube and is status post venting gastrostomy.  She is currently getting chemotherapy in the hopes of improving her bowel obstruction so that she can take p.o.   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  The differential diagnosis for this is quite broad.  Her ammonia is normal.  She is no evidence of acute infection.  She is just started ifosfamide and Adriamycin.  It is unclear if dehydration and mild hyponatremia additionally playing a role but per oncology diphosphonate is felt to be the most likely culprit.  Fortunately her head CT is negative.  She is on quite a high dose of opiates however she is complaining of pain. - Per oncology  lowering dose of ifosfamide -Per palliative care discontinuing patch and continuing PRN hydromorphone for pain control only -Gentle fluids  #) Malignant complete small bowel obstruction: Secondary to high-grade retroperitoneal sarcoma. -Repeat abdominal x-ray today -Status post venting PEG tube by interventional radiology on 02/28/2018, currently to straight drain with bilious material, PEG tube can be used for medications - Discontinue half-normal saline and transition to free water, will give 50 g of albumin -Palliative care following appreciate recommendations, discontinue 200 mcg fentanyl patch, IV as needed hydromorphone boluses -Oncology following appreciate recommendations, plan is for palliative chemotherapy with doxorubicin with ifosfamide and mesna - Patient will get 1 dose of IV furosemide due to lower extremity edema left greater than right  #) Bilateral hydronephrosis, complicated by UTI: Status post bilateral ureteral stents on 02/14/2018 -Urine culture from 02/27/2018 growing a Klebsiella that is pansensitive -Repeat urine culture from 02/26/2018 no growth to date -Continue cefazolin started 02/11/2018 -Urology consult, appreciate recommendations  #) Anemia: Likely related to malignancy and iron deficiency. -Status post a dose of IV iron on 02/22/2018 and 02/26/2018  #) Hypertension/tachycardia: Improved with IV metoprolol -Patient is on carvedilol 12.5 mg twice daily at home, -Continue IV metoprolol tartrate's 5 mg every 6 hours here  Fluids: D5 free water and albumin Elect lites: Monitor and supplement Nutrition: N.p.o. except for ice chips  Prophylaxis: Enoxaparin  Disposition: Pending palliative chemotherapy  DO NOT RESUSCITATE  Consultants:   Palliative care  Oncology  Urology  Interventional radiology  Procedures:   03/02/2018 status post bilateral ureteral stents  Antimicrobials:   Continue cefazolin  started 02/27/2018   Subjective: Patient was  noted to be quite altered over the last 12 hours.  A stat head CT was negative.  It was felt to be related to medications.  She is additionally complaining of pain particularly in that left leg.  Objective: Vitals:   03/02/18 1346 03/02/18 2017 03/03/18 0440 03/03/18 0513  BP: (!) 130/93 124/86 (!) 145/98   Pulse: (!) 106 (!) 112 (!) 116   Resp: 17 18 20    Temp: 98.9 F (37.2 C) 99.3 F (37.4 C) 99 F (37.2 C) 98.4 F (36.9 C)  TempSrc: Oral Oral Oral Oral  SpO2: 100% 98% 94%   Weight:      Height:        Intake/Output Summary (Last 24 hours) at 03/03/2018 1037 Last data filed at 03/02/2018 1926 Gross per 24 hour  Intake 5623.76 ml  Output 1750 ml  Net 3873.76 ml   Filed Weights   02/12/2018 1121 03/03/2018 2300  Weight: 48.5 kg (107 lb) 47.6 kg (104 lb 15 oz)    Examination:  General exam: Mild distress Respiratory system: No increased work of breathing, diminished lung sounds at bases Cardiovascular system: Tachycardic, regular rhythm, no murmurs Gastrointestinal system: Distended, tender to palpation, no rebound or guarding, no bowel sounds Central nervous system: Alert but not oriented, no focal neurological deficits Extremities: Left greater than right 2+ lower extremity edema Skin: Picc site is clean dry and intact, venting G-tube site is clean dry and intact Psychiatry: Unable to assess due to medical condition    Data Reviewed: I have personally reviewed following labs and imaging studies  CBC: Recent Labs  Lab 02/26/18 0517 02/27/18 0631 02/28/18 0558 03/01/18 0559 03/03/18 0608  WBC 16.7* 27.8* 27.6* 27.1* 20.9*  NEUTROABS  --   --   --  25.0* 18.8*  HGB 9.7* 9.2* 8.5* 8.2* 7.7*  HCT 29.9* 28.2* 25.9* 24.8* 23.0*  MCV 78.1 78.1 78.2 79.2 78.8  PLT 550* 525* 458* 379 737   Basic Metabolic Panel: Recent Labs  Lab 02/27/18 0631 02/28/18 0558 03/01/18 0559 03/02/18 0535 03/03/18 0608  NA 143 143 148* 152* 153*  K 3.9 3.9 3.9 4.3 4.7  CL 114* 114*  117* 107 102  CO2 16* 18* 20* 33* 34*  GLUCOSE 78 88 74 125* 110*  BUN 15 17 18 15 16   CREATININE 0.86 0.96 0.89 0.78 0.93  CALCIUM 8.7* 8.7* 8.8* 8.4* 8.3*  MG  --  1.9  --  2.1 2.3   GFR: Estimated Creatinine Clearance: 48.9 mL/min (by C-G formula based on SCr of 0.93 mg/dL). Liver Function Tests: Recent Labs  Lab 03/02/18 0535 03/03/18 0608  AST 14* 23  ALT <5 6  ALKPHOS 52 56  BILITOT 0.3 0.2*  PROT 5.4* 5.7*  ALBUMIN 2.2* 2.2*   No results for input(s): LIPASE, AMYLASE in the last 168 hours. Recent Labs  Lab 03/03/18 0610  AMMONIA 16   Coagulation Profile: Recent Labs  Lab 02/27/18 1300  INR 1.16   Cardiac Enzymes: No results for input(s): CKTOTAL, CKMB, CKMBINDEX, TROPONINI in the last 168 hours. BNP (last 3 results) No results for input(s): PROBNP in the last 8760 hours. HbA1C: No results for input(s): HGBA1C in the last 72 hours. CBG: No results for input(s): GLUCAP in the last 168 hours. Lipid Profile: No results for input(s): CHOL, HDL, LDLCALC, TRIG, CHOLHDL, LDLDIRECT in the last 72 hours. Thyroid Function Tests: No results for input(s): TSH, T4TOTAL, FREET4, T3FREE, THYROIDAB in the  last 72 hours. Anemia Panel: No results for input(s): VITAMINB12, FOLATE, FERRITIN, TIBC, IRON, RETICCTPCT in the last 72 hours. Sepsis Labs: Recent Labs  Lab 03/03/18 0608  PROCALCITON 0.70    Recent Results (from the past 240 hour(s))  Culture, Urine     Status: None   Collection Time: 02/26/18  1:35 PM  Result Value Ref Range Status   Specimen Description   Final    URINE, CLEAN CATCH Performed at [facility], [place] [address] [place], [place] [address]    Special Requests   Final    NONE Performed at [facility], [place] [address] [place], [place] [address]    Culture   Final    NO GROWTH Performed at [facility], [place] [address], [place], [place] [address]    Report Status 02/27/2018 FINAL  Final          Radiology Studies: Ct Head Wo Contrast  Result Date: 03/03/2018 CLINICAL DATA:  Altered mental status EXAM: CT HEAD WITHOUT CONTRAST TECHNIQUE: Contiguous axial images were obtained from the base of the skull through the vertex without intravenous contrast. COMPARISON:  None. FINDINGS: Brain: Mild atrophic changes are identified. No findings to suggest acute hemorrhage, acute infarction or space-occupying mass lesion are noted. Vascular: No hyperdense vessel or unexpected calcification. Skull: Normal. Negative for fracture or focal lesion. Sinuses/Orbits: No acute finding. Other: None. IMPRESSION: Atrophic changes without acute abnormality. Electronically Signed   By: Inez Catalina M.D.   On: 03/03/2018 08:39        Scheduled Meds: . DOXOrubicin (ADRIAMYCIN) CHEMO for Inpatient continous infusion  20 mg/m2 (Treatment Plan Recorded) Intravenous Once  . DOXOrubicin (ADRIAMYCIN) CHEMO for Inpatient continous infusion  20 mg/m2 (Treatment Plan Recorded) Intravenous Once  . enoxaparin (LOVENOX) injection  40 mg Subcutaneous Q24H  . metoprolol tartrate  5 mg Intravenous Q6H  . palonosetron  0.25 mg Intravenous Once   Continuous Infusions: . sodium chloride 10 mL/hr at 03/01/18 1055  .  ceFAZolin (ANCEF) IV 1 g (03/03/18 1036)  . dexamethasone (DECADRON) IVPB [facility]    . dextrose 75 mL/hr at 03/03/18 0752  . ondansetron (ZOFRAN) IV       LOS: 11 days    Time spent: [place], MD [facility]  If 7PM-7AM, please contact night-coverage [email or web address] Password [facility] 03/03/2018, 10:37 AM

## 2018-03-03 NOTE — Progress Notes (Signed)
Received phone call from bedside RN regarding change in mental status at approximately 0500. She states pt woke up and was unable to answer questions appropriately. On assessment, pt a/o x2, tremors noted. VSS. She does not answer questions appropriately. Sometimes only giving a response of "yeah". Bedside RN states that she has had similar episodes in the past few days but this one appears to be longer in duration. Son voices concerns about his mother's decline in mental status. CMP, Ammonia levels and CT of the head ordered. Advised RN to closely monitor neuro exams.   Altered Mental Status -CT of the head - CMP, Ammonia - Neuro checks q4   Lovey Newcomer, NP [facility] 7p-7a

## 2018-03-03 NOTE — Progress Notes (Addendum)
Rapid Response Event Note  Overview:  Called to room 1604 at 0457 for patient having increased confusion, only answering "yes". Also patient is shaky. Arrived 0505    Initial Focused Assessment: Pt sitting up in chair, tremors noted. Pt constantly repeating "yeah" and answering "yeah". Pt was alert to self and was able to state that she was "coming out of it."   HR appears to be ST with a rate of 126 bpm. Oxygen saturation 100% on room air. BP 121/97 (105). Afebrile, temperature 65F oral.   Grips weak and equal bilaterally. PERRLA. Pt able to feel prick and soft touch on bilateral lower extremities. Face and smile symmetrical.  Patient currently receiving chemotherapy, [place] NP notified of patient's symptoms listed above and medication being administered.  Patient son at bedside. Per son, patient has been progressively getting more confused over the past two or so days. Patient confusion began with occasional repetition of words and unable to gather thoughts. Then progressed to increased confusion. Now patient is repetitively stating the same word and not consistently able to answer questions due to word repetition.   Interventions: NP at bedside, ordered routine CT head and additional morning labs.   Plan of Care (if not transferred): RN to continue to monitor patient for changes in neurologic status. Notify rapid response for further needs.   Event Summary:  Casimer Bilis

## 2018-03-03 NOTE — Progress Notes (Signed)
IP PROGRESS NOTE  Subjective:   Anita Whitehead reported episodes of confusion over the last 12 hours.  She had difficulties recognizing her family numbers at times and periodic disorientation.  She did not have any agitation or increased pain.  She denies any loss of consciousness.  Overall, she did not report any chemotherapy toxicities otherwise.  She does not have any nausea vomiting.  She does report abdominal fullness that has been unchanged.  She does not report any headaches, blurry vision, syncope or seizures. Does not report any fevers, chills or sweats.  Does not report any cough, wheezing or hemoptysis.  Does not report any chest pain, palpitation, orthopnea.   Does not report frequency, urgency or hematuria.  Does not report any skin rashes or lesions.  Denies any back pain or bone pain.  Denies any bleeding or clotting tendencies.  Remaining review of systems is negative.    Objective:  Vital signs in last 24 hours: Temp:  [98 F (36.7 C)-99.3 F (37.4 C)] 98.4 F (36.9 C) (07/28 0513) Pulse Rate:  [106-116] 116 (07/28 0440) Resp:  [12-20] 20 (07/28 0440) BP: (124-145)/(85-98) 145/98 (07/28 0440) SpO2:  [94 %-100 %] 94 % (07/28 0440) FiO2 (%):  [28 %] 28 % (07/27 1233) Weight change:  Last BM Date: 02/04/2018  Intake/Output from previous day: 07/27 0701 - 07/28 0700 In: 5623.8 [I.V.:4265; IV Piggyback:1358.8] Out: 2500 [Urine:1000; Drains:1500] General: Comfortable appearing woman slightly disoriented. Head: Atraumatic without abnormalities. Mouth: No oral thrush or ulcers. Eyes: Sclera anicteric. Resp: Expiratory wheezes noted.  Good breath sounds bilaterally. Cardio: regular rate and rhythm, S1, S2 with bilateral leg edema. GI: soft, slightly distended without any rebound or guarding.  No shifting dullness or ascites. Musculoskeletal: No clubbing or cyanosis. Neurological: Moving all extremities without any deficits.  She is oriented to person and place.  Answering  questions appropriately at times. Skin: No ecchymosis or petechiae.   Lab Results: Recent Labs    03/01/18 0559 03/03/18 0608  WBC 27.1* 20.9*  HGB 8.2* 7.7*  HCT 24.8* 23.0*  PLT 379 350    BMET Recent Labs    03/02/18 0535 03/03/18 0608  NA 152* 153*  K 4.3 4.7  CL 107 102  CO2 33* 34*  GLUCOSE 125* 110*  BUN 15 16  CREATININE 0.78 0.93  CALCIUM 8.4* 8.3*     Medications: I have reviewed the patient's current medications.  Assessment/Plan:  62 year old woman with the following:  1.  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  Disease involves intra-abdominal retroperitoneal areas.  She completed day 2 of her chemotherapy utilizing Adriamycin, ifosfamide and mesna with few complications.  She has experienced mental confusion and increased lethargy which could be attributed to ifosfamide.  I have recommended discontinuation of ifosfamide and mesna for the current cycle.  These medication can be resumed and subsequent cycles for episode resolved spontaneously and potentially be given in the lower doses.  Adriamycin will be continued for the time being given the stability of her condition.  2.  Altered mental status: The differential diagnosis was reviewed today with the patient and her family.  Her ammonia level is normal and her CT scan of the head is pending.  Ifosfamide toxicity is high on the list of contributing factors.  Hypernatremia and salt toxicity could be also a contributing factor.  I have recommended discontinuation of ifosfamide for the cycle of chemotherapy.  3.  Hypernatremia: Overall stable last 24 hours.  Could  be related to poor free water intake and increased salt intake related to her intravenous IV fluid.  They recommend gentle free water replacement to slowly correct her hypernatremia.  4.  Abdominal pain: Pain is manageable at this time.  The palliative care team.  5.  Lower extremity edema: Slightly improved with  Lasix overnight.  Decreasing her IV fluid intake will help this process.  6.  Disposition not ready for discharge at this time.  She remains DO NOT RESUSCITATE which is appropriate CODE STATUS at this time.  25  minutes was spent with the patient face-to-face today.  More than 50% of time was dedicated to patient evaluation, education and discussing factors contributing to her condition and future plan of care.      LOS: 11 days   Zola Button 03/03/2018, 8:25 AM

## 2018-03-04 DIAGNOSIS — Z66 Do not resuscitate: Secondary | ICD-10-CM

## 2018-03-04 LAB — MAGNESIUM: Magnesium: 1.9 mg/dL (ref 1.7–2.4)

## 2018-03-04 LAB — CBC WITH DIFFERENTIAL/PLATELET
Basophils Absolute: 0 10*3/uL (ref 0.0–0.1)
Basophils Relative: 0 %
Eosinophils Absolute: 0 K/uL (ref 0.0–0.7)
Eosinophils Relative: 0 %
HCT: 23.4 % — ABNORMAL LOW (ref 36.0–46.0)
Hemoglobin: 7.6 g/dL — ABNORMAL LOW (ref 12.0–15.0)
Lymphocytes Relative: 2 %
Lymphs Abs: 0.4 K/uL — ABNORMAL LOW (ref 0.7–4.0)
MCH: 25.7 pg — ABNORMAL LOW (ref 26.0–34.0)
MCHC: 32.5 g/dL (ref 30.0–36.0)
MCV: 79.1 fL (ref 78.0–100.0)
Monocytes Absolute: 0.6 10*3/uL (ref 0.1–1.0)
Monocytes Relative: 3 %
Neutro Abs: 17.3 K/uL — ABNORMAL HIGH (ref 1.7–7.7)
Neutrophils Relative %: 95 %
Platelets: 283 K/uL (ref 150–400)
RBC: 2.96 MIL/uL — ABNORMAL LOW (ref 3.87–5.11)
RDW: 22.1 % — ABNORMAL HIGH (ref 11.5–15.5)
WBC: 18.3 K/uL — ABNORMAL HIGH (ref 4.0–10.5)

## 2018-03-04 LAB — COMPREHENSIVE METABOLIC PANEL WITH GFR
Alkaline Phosphatase: 52 U/L (ref 38–126)
Anion gap: 15 (ref 5–15)
CO2: 30 mmol/L (ref 22–32)
Calcium: 7.8 mg/dL — ABNORMAL LOW (ref 8.9–10.3)
Chloride: 94 mmol/L — ABNORMAL LOW (ref 98–111)
GFR calc Af Amer: 57 mL/min — ABNORMAL LOW (ref >60)
Glucose, Bld: 156 mg/dL — ABNORMAL HIGH (ref 70–99)
Sodium: 139 mmol/L (ref 135–145)

## 2018-03-04 LAB — PROCALCITONIN: Procalcitonin: 0.52 ng/mL

## 2018-03-04 LAB — COMPREHENSIVE METABOLIC PANEL
ALT: 5 U/L (ref 0–44)
AST: 24 U/L (ref 15–41)
Albumin: 2.4 g/dL — ABNORMAL LOW (ref 3.5–5.0)
BUN: 19 mg/dL (ref 6–20)
Creatinine, Ser: 1.19 mg/dL — ABNORMAL HIGH (ref 0.44–1.00)
GFR calc non Af Amer: 49 mL/min — ABNORMAL LOW (ref >60)
Potassium: 3.3 mmol/L — ABNORMAL LOW (ref 3.5–5.1)
Total Bilirubin: 0.6 mg/dL (ref 0.3–1.2)
Total Protein: 5 g/dL — ABNORMAL LOW (ref 6.5–8.1)

## 2018-03-04 MED ORDER — HYDROMORPHONE BOLUS VIA INFUSION
1.0000 mg | INTRAVENOUS | Status: DC | PRN
  Filled 2018-03-04: qty 1

## 2018-03-04 MED ORDER — LORAZEPAM 2 MG/ML IJ SOLN: 1.0000 mg | INTRAMUSCULAR | Status: DC | PRN

## 2018-03-04 MED ORDER — GLYCOPYRROLATE 0.2 MG/ML IJ SOLN
0.1000 mg | Freq: Two times a day (BID) | INTRAMUSCULAR | Status: DC
  Administered 2018-03-04: 0.1 mg via INTRAVENOUS
  Filled 2018-03-04: qty 1

## 2018-03-04 MED ORDER — HALOPERIDOL LACTATE 5 MG/ML IJ SOLN
5.0000 mg | Freq: Four times a day (QID) | INTRAMUSCULAR | Status: DC | PRN
  Administered 2018-03-04: 5 mg via INTRAVENOUS
  Filled 2018-03-04: qty 1

## 2018-03-04 MED ORDER — LORAZEPAM 2 MG/ML IJ SOLN
0.5000 mg | INTRAMUSCULAR | Status: DC | PRN
  Administered 2018-03-04: 0.5 mg via INTRAVENOUS

## 2018-03-04 MED ORDER — LORAZEPAM 2 MG/ML IJ SOLN
INTRAMUSCULAR | Status: AC
  Filled 2018-03-04: qty 1

## 2018-03-04 MED ORDER — HALOPERIDOL LACTATE 5 MG/ML IJ SOLN
2.0000 mg | Freq: Once | INTRAMUSCULAR | Status: AC
  Administered 2018-03-04: 2 mg via INTRAVENOUS
  Filled 2018-03-04 (×2): qty 1

## 2018-03-04 MED ORDER — HYDROMORPHONE BOLUS VIA INFUSION
1.0000 mg | INTRAVENOUS | Status: DC | PRN
  Administered 2018-03-04 – 2018-03-10 (×24): 1 mg via INTRAVENOUS
  Filled 2018-03-04: qty 1

## 2018-03-04 MED ORDER — HYDROMORPHONE HCL 1 MG/ML IJ SOLN
1.0000 mg | Freq: Once | INTRAMUSCULAR | Status: AC
  Administered 2018-03-04: 1 mg via INTRAVENOUS

## 2018-03-04 MED ORDER — LIP MEDEX EX OINT
TOPICAL_OINTMENT | CUTANEOUS | Status: AC
  Filled 2018-03-04: qty 7

## 2018-03-04 MED ORDER — GLYCOPYRROLATE 0.2 MG/ML IJ SOLN
0.2000 mg | Freq: Two times a day (BID) | INTRAMUSCULAR | Status: DC
  Administered 2018-03-04 – 2018-03-11 (×12): 0.2 mg via INTRAVENOUS
  Filled 2018-03-04 (×12): qty 1

## 2018-03-04 MED ORDER — GLYCOPYRROLATE 0.2 MG/ML IJ SOLN
0.2000 mg | INTRAMUSCULAR | Status: DC | PRN
  Administered 2018-03-10 – 2018-03-12 (×2): 0.2 mg via INTRAVENOUS
  Filled 2018-03-04 (×3): qty 1

## 2018-03-04 MED ORDER — LORAZEPAM 2 MG/ML IJ SOLN
1.0000 mg | INTRAMUSCULAR | Status: DC | PRN
  Administered 2018-03-04 – 2018-03-13 (×24): 1 mg via INTRAVENOUS
  Filled 2018-03-04 (×24): qty 1

## 2018-03-04 MED ORDER — SODIUM CHLORIDE 0.9 % IV SOLN
2.0000 mg/h | INTRAVENOUS | Status: DC
  Administered 2018-03-04 – 2018-03-10 (×7): 1 mg/h via INTRAVENOUS
  Administered 2018-03-10 – 2018-03-14 (×8): 2 mg/h via INTRAVENOUS
  Filled 2018-03-04 (×15): qty 2.5

## 2018-03-04 MED ORDER — HALOPERIDOL LACTATE 5 MG/ML IJ SOLN
2.0000 mg | INTRAMUSCULAR | Status: DC | PRN
  Administered 2018-03-10 – 2018-03-13 (×2): 2 mg via INTRAVENOUS
  Filled 2018-03-04 (×2): qty 1

## 2018-03-04 MED ORDER — MORPHINE 100MG IN NS 100ML (1MG/ML) PREMIX INFUSION
5.0000 mg/h | INTRAVENOUS | Status: DC
Start: 2018-03-04 — End: 2018-03-04
  Administered 2018-03-04: 5 mg/h via INTRAVENOUS
  Filled 2018-03-04: qty 100

## 2018-03-04 NOTE — Progress Notes (Addendum)
Palliative:  I met today at Ms. Anita Whitehead's bedside with 2 family members. They are very tearful but are glad that she is more comfortable. Unfortunately Ms. Anita Whitehead has had significant decline over the weekend with delirium and agitation. She has continued to decline and family has opted for comfort care at this time. Discussed signs of progression at EOL - they have more family en-route from [place] (her 4 sisters). They had many questions regarding EOL which I answered including likely prognosis of a few days or a few hours. Their main concern is that she remains peaceful and does not suffer. All questions/concerns addressed to the best of my ability. Emotional support provided.   Exam: Ms. Anita Whitehead is resting comfortably on dilaudid infusion and NRB mask. Unresponsive, no distress.   Plan: Full comfort care. Liberalize prns to ensure comfort.   15 min  [name], NP Palliative Medicine Team Pager # [phone] (M-F 8a-5p) Team Phone # [phone] (Nights/Weekends)

## 2018-03-04 NOTE — Progress Notes (Signed)
At 515, RN and NT were cleaning pt of incontinence when she suddenly developed labored breathing, wheezing, diaphoresis, and pale color to face. Immediately checked O2 sat, it was 53% on RA. Placed pt on NRB mask and paged rapid response. RR RN and on call NP arrived to bedside, see their progress notes for more details. Pts husband and son at bedside, discussion with NP resulting in decision to make pt comfortable. Morphine gtt ordered; paged on call oncologist and received order to stop chemo that was infusing. Paged chaplain to provide support to family. Continue to monitor. Hortencia Conradi RN

## 2018-03-04 NOTE — Progress Notes (Signed)
Called via rapid response to patients room for desaturations.  Pt. Was placed on PRB and saturations in the mid to low 90s at this time.  Husband arrived and stated that he/family and patient had frank discussion and all involved wanted patient to be comfortable.

## 2018-03-04 NOTE — Progress Notes (Signed)
Pt has woken up several times yelling and grabbing at the air, trying to move about in the bed. She has been given pain medication consistently, but it hasn't seemed to have an effect on this behavior. It seems more like agitation. Paged on call NP and received order for haldol 2mg . Will monitor for effect. Hortencia Conradi RN

## 2018-03-04 NOTE — Significant Event (Addendum)
Rapid Response Event Note  Overview: Time Called: 0522 Arrival Time: 0530 Event Type: Respiratory  Initial Focused Assessment: Patient diaphoretic, not following commands or tracking, has accessory muscle use, is moaning. Oxygen saturation 80% on 15L NRB, but improving. Per RN, patient has been requiring pain medication every two hours throughout the night. Pain location is believed to be in patient's abdomen.   Interventions: Per NP, rapid response not to order ABG.  NP is having goals of care discussion with patient's husband and son.   Plan of Care (if not transferred):  Event Summary: Name of Physician Notified: X. Blount, NP at [phone]    at    Outcome: Code status clarified, NP had goals of care discussion with patient's husband and son. At this time patient's family agree that they want her to be completely comfortable. Family made the decision to initiate a morphine gtt. Oxygen saturation up to 93% on 10L partial NRB.   Event End Time: [place]

## 2018-03-04 NOTE — Progress Notes (Signed)
Pt's husband [name]) was bedside when I arrived. He was holding pt's hand and encouraging her to rest. He said she was trying to pull off her mask. Pt's husband was concerned that her AD were not complete, however, pt has DNR. Pt's husband understood that (as he explained to me) and was okay. Husband explained their [place] background. Pt's sons arrived during our visit. Both were very tearful; her oldest son arrived during prayer was overtaken by pt's condition and was sobbing at bedside. Chaplain offered emotional, spiritual, and presence. Please page if additional assistance is needed. [place], MDiv   03/04/18 0700  Clinical Encounter Type  Visited With Patient and family together

## 2018-03-04 NOTE — Progress Notes (Signed)
Called to the bedside with concerns of hypoxemia. At time of assessment pt is moaning, unresponsive to verbal stimuli. Despite receiving multiple doses of pain medication pt still appears to be in a great deal of pain. Sats appear to be rebounding after being placed on non-rebreather. Both son and father is at bedside. Discussed with them the plan of care. At this time they wish to make sure pt is completely comfortable. Pt started on a morphine gtt for comfort measures.   Lovey Newcomer, NP [facility] 7p-7a [phone]

## 2018-03-04 NOTE — Progress Notes (Signed)
   03/04/18 1000  Clinical Encounter Type  Visited With Patient and family together  Visit Type Spiritual support;Initial;Psychological support  Referral From Nurse  Consult/Referral To Chaplain  Spiritual Encounters  Spiritual Needs Literature;Emotional;Other (Comment) (Spiritual Care Conversation/Support)  Stress Factors  Patient Stress Factors Not reviewed  Family Stress Factors Health changes;Major life changes  Advance Directives (For Healthcare)  Does Patient Have a Medical Advance Directive? No  Would patient like information on creating a medical advance directive? No - Patient declined   I visited with the family of the patient per Spiritual Care consult. The consult was for an Advance Directive, but the patient was not awake and able to complete the document. The patient is automatically protected by law due to her wanting her spouse to make decisions for her in the event that she is unable. The family is all in agreement about her care and supportive of one another and the patient.  I provided support for the family.   Please, contact Spiritual Care for further assistance.   Chaplain Shanon Ace M.Div., [facility]

## 2018-03-04 NOTE — Progress Notes (Signed)
Witnessed waste of 75mg  morphine gtt with Wilmer Floor, RN.

## 2018-03-04 NOTE — Progress Notes (Signed)
PROGRESS NOTE    Anita Whitehead  PTW:[id number] DOB: 05/26/1956 DOA: 02/18/2018 PCP: [name], NP    Brief Narrative:  62 year old with past medical history relevant for remote history of cervical cancer status post radiation, hypertension, hyperlipidemia, known left-sided high-grade sarcoma who was admitted with partial small bowel obstruction to [facility] on 02/16/2018 and was noted to have mild AKI and bilateral left greater than right hydronephrosis.  She initially had mild improvement of her partial small bowel obstruction as she was not deemed a surgical candidate by general surgery and was tolerating clear liquids.  She was transferred to [facility] on 02/23/2018 and on 02/26/2018 received bilateral ureteral stents.  She is currently now n.p.o. due to complete bowel obstruction and has an NG tube and is status post venting gastrostomy.  She was receiving chemotherapy in the hopes that it would relieve some of the obstruction and allow her to use her bowel however her condition is deteriorated dramatically with worsening hypoxia and she is transition to comfort care.   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  N. -Discontinue non-comfort care treatment -Continue PRN lorazepam IV 1 mg every 4 hours -Continue PRN haloperidol IV 2 mg every 4 hours -Continue glycopyrrolate 0.1 mg twice daily -Discontinue morphine infusion, transition to hydromorphone infusion -palliative care is  following  #) Malignant complete small bowel obstruction Secondary to high-grade retroperitoneal sarcoma: -Per above patient is on comfort care  #) Bilateral hydronephrosis/UTI/hypertension/tachycardia/anemia: Status post bilateral ureteral stents on 02/04/2018 - On comfort care now  #) Anemia: Likely related to malignancy and iron deficiency. -Status post a dose of IV iron on 02/22/2018 and 02/26/2018  #) Hypertension/tachycardia: Improved with IV metoprolol -Patient is on carvedilol 12.5 mg twice daily at home, -Continue IV metoprolol tartrate's 5 mg every 6 hours here  Fluids: P.o. ad lib. Elect lites: Not applicable Nutrition: P.o. ad lib.  Prophylaxis: None  Disposition: Pending end of life or transfer to inpatient hospice  DO NOT RESUSCITATE, comfort care  Consultants:   Palliative care  Oncology  Urology  Interventional radiology  Procedures:   02/24/2018 status post bilateral ureteral stents  Antimicrobials:   Continue cefazolin started 02/27/2018 to 03/04/2018   Subjective: Patient continued to be altered last night he became progressively more hypoxic and short of breath.  She required to complete nonrebreather.  On discussion with the family they opted to transition the patient to comfort care as they did not feel the chemotherapy was feasible and was not helping her.  Objective: Vitals:   03/03/18 1340 03/03/18 1620 03/03/18 2157 03/03/18 2300  BP: 124/85 (!) 151/100 (!) 143/90   Pulse: (!) 133 (!) 128 (!) 119   Resp: 16  14   Temp: 98.7 F (37.1 C)  98.7 F (37.1 C)   TempSrc: Oral  Oral   SpO2: (!) 89% 92% (!) 89% 92%  Weight:      Height:        Intake/Output Summary (Last 24 hours) at 03/04/2018 1036  Last data filed at 03/03/2018 2346 Gross per 24 hour  Intake 2068.18 ml  Output 1550 ml  Net 518.18 ml   Filed Weights   02/16/2018 1121 02/17/2018 2300  Weight: 48.5 kg (107 lb) 47.6 kg (104 lb 15 oz)    Examination:  General exam: Sleeping  in bed, no acute distress Respiratory system: No increased work of breathing, diminished lung sounds at bases Cardiovascular system: Tachycardic, regular rhythm, no murmurs Gastrointestinal system: Distended, tender to palpation, no rebound or guarding, no bowel sounds Central nervous system: Not alert oriented Extremities: Left greater than right 2+ lower extremity edema Skin: Picc site is clean dry and intact, venting G-tube site is clean dry and intact Psychiatry: Unable to assess due to medical condition    Data Reviewed: I have personally reviewed following labs and imaging studies  CBC: Recent Labs  Lab 02/27/18 0631 02/28/18 0558 03/01/18 0559 03/03/18 0608 03/04/18 0512  WBC 27.8* 27.6* 27.1* 20.9* 18.3*  NEUTROABS  --   --  25.0* 18.8* 17.3*  HGB 9.2* 8.5* 8.2* 7.7* 7.6*  HCT 28.2* 25.9* 24.8* 23.0* 23.4*  MCV 78.1 78.2 79.2 78.8 79.1  PLT 525* 458* 379 350 789   Basic Metabolic Panel: Recent Labs  Lab 02/28/18 0558 03/01/18 0559 03/02/18 0535 03/03/18 0608 03/04/18 0512  NA 143 148* 152* 153* 139  K 3.9 3.9 4.3 4.7 3.3*  CL 114* 117* 107 102 94*  CO2 18* 20* 33* 34* 30  GLUCOSE 88 74 125* 110* 156*  BUN 17 18 15 16 19   CREATININE 0.96 0.89 0.78 0.93 1.19*  CALCIUM 8.7* 8.8* 8.4* 8.3* 7.8*  MG 1.9  --  2.1 2.3 1.9   GFR: Estimated Creatinine Clearance: 38.3 mL/min (A) (by C-G formula based on SCr of 1.19 mg/dL (H)). Liver Function Tests: Recent Labs  Lab 03/02/18 0535 03/03/18 0608 03/04/18 0512  AST 14* 23 24  ALT 5 6 5   ALKPHOS 52 56 52  BILITOT 0.3 0.2* 0.6  PROT 5.4* 5.7* 5.0*  ALBUMIN 2.2* 2.2* 2.4*   No results for input(s): LIPASE, AMYLASE in the last 168 hours. Recent Labs  Lab 03/03/18 0610  AMMONIA 16   Coagulation Profile: Recent Labs  Lab 02/27/18 1300  INR 1.16   Cardiac Enzymes: No results for input(s): CKTOTAL, CKMB, CKMBINDEX, TROPONINI in the last 168 hours. BNP (last 3 results) No results for input(s): PROBNP in  the last 8760 hours. HbA1C: No results for input(s): HGBA1C in the last 72 hours. CBG: No results for input(s): GLUCAP in the last 168 hours. Lipid Profile: No results for input(s): CHOL, HDL, LDLCALC, TRIG, CHOLHDL, LDLDIRECT in the last 72 hours. Thyroid Function Tests: No results for input(s): TSH, T4TOTAL, FREET4, T3FREE, THYROIDAB in the last 72 hours. Anemia Panel: No results for input(s): VITAMINB12, FOLATE, FERRITIN, TIBC, IRON, RETICCTPCT in the last 72 hours. Sepsis Labs: Recent Labs  Lab 03/03/18 3810 03/04/18 0512  PROCALCITON 0.70 0.52    Recent Results (from the past 240 hour(s))  Culture, Urine     Status: None   Collection Time: 02/26/18  1:35 PM  Result Value Ref Range Status   Specimen Description   Final    URINE, CLEAN CATCH Performed at [facility] [address], [place], [place] [address]    Special Requests   Final    NONE Performed at [facility], [place] [address], [place], [place] [address]    Culture   Final    NO  GROWTH Performed at [facility], [place] [address], [place], [place] [address]    Report Status 02/27/2018 FINAL  Final         Radiology Studies: Ct Head Wo Contrast  Result Date: 03/03/2018 CLINICAL DATA:  Altered mental status EXAM: CT HEAD WITHOUT CONTRAST TECHNIQUE: Contiguous axial images were obtained from the base of the skull through the vertex without intravenous contrast. COMPARISON:  None. FINDINGS: Brain: Mild atrophic changes are identified. No findings to suggest acute hemorrhage, acute infarction or space-occupying mass lesion are noted. Vascular: No hyperdense vessel or unexpected calcification. Skull: Normal. Negative for fracture or focal lesion. Sinuses/Orbits: No acute finding. Other: None. IMPRESSION: Atrophic changes without acute abnormality. Electronically Signed   By: Inez Catalina M.D.   On: 03/03/2018 08:39   Dg Abd 2 Views  Result Date: 03/03/2018 CLINICAL DATA:   Abdominal pain and swelling EXAM: ABDOMEN - 2 VIEW COMPARISON:  Four days ago FINDINGS: Percutaneous gastrostomy tube with T tack in place. Oral contrast is still seen within the colon. Elsewhere there is gaseous distension of bowel. Bilateral internal ureteral stents. Bowel is preferentially on the right due to large pelvic and left abdominal mass by CT. IMPRESSION: Continued ileus findings with contrast remaining within the colon. There may be superimposed partial obstruction due to the patient's bulky left abdominal and pelvic mass. Electronically Signed   By: Monte Fantasia M.D.   On: 03/03/2018 13:13        Scheduled Meds: . glycopyrrolate  0.1 mg Intravenous BID   Continuous Infusions: . sodium chloride 10 mL/hr at 03/01/18 1055  . morphine 5 mg/hr (03/04/18 0644)  . ondansetron (ZOFRAN) IV       LOS: 12 days    Time spent: [place], MD [facility]  If 7PM-7AM, please contact night-coverage [email or web address] Password [facility] 03/04/2018, 10:36 AM

## 2018-03-04 NOTE — Progress Notes (Signed)
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 Comfort measures only  Completebowel obstruction She has venting gastrostomy tube placement.  Goals of care DNR, comfort measures only. Will add PRN lorazepam and schedule Robinul for secretion  Severe cancer pain,improved on PCA I appreciated assistant frompalliative care consult for symptom management and assistance in severe cancer pain management  Discharge planning I anticipate hospital death within the next few days.  [name], MD 03/04/2018  7:50 AM   Subjective:  I have reviewed the chart.  The patient had deterioration over the weekend.  Chemotherapy was canceled.  Ultimately, family members have made informed decision to transition her care to comfort measures only. When I entered the room, the patient is intermittently alert.  She appears comfortable.  She is surrounded by family members  Objective:  Vitals:   03/03/18 2157 03/03/18 2300  BP: (!) 143/90   Pulse: (!) 119   Resp: 14   Temp: 98.7 F (37.1 C)   SpO2: (!) 89% 92%     Intake/Output Summary (Last 24 hours) at 03/04/2018 0750 Last data filed at 03/03/2018 2346 Gross per 24 hour  Intake 2068.18 ml  Output 1550 ml  Net 518.18 ml    GENERAL: She appears intermittently alert, in mild respiratory distress SKIN: skin color, texture, turgor are normal, no rashes or significant lesions LUNGS: Bilateral diffuse rhonchi.  Increased respiratory effort HEART: Tachycardia with bilateral lower extremity edema ABDOMEN:abdomen soft,    Labs:  Lab Results  Component Value Date   WBC 18.3 (H) 03/04/2018   HGB 7.6 (L) 03/04/2018   HCT 23.4 (L) 03/04/2018   MCV 79.1 03/04/2018   PLT 283 03/04/2018   NEUTROABS 17.3 (H) 03/04/2018    Lab Results  Component Value Date   NA 139  03/04/2018   K 3.3 (L) 03/04/2018   CL 94 (L) 03/04/2018   CO2 30 03/04/2018    Studies:  Ct Head Wo Contrast  Result Date: 03/03/2018 CLINICAL DATA:  Altered mental status EXAM: CT HEAD WITHOUT CONTRAST TECHNIQUE: Contiguous axial images were obtained from the base of the skull through the vertex without intravenous contrast. COMPARISON:  None. FINDINGS: Brain: Mild atrophic changes are identified. No findings to suggest acute hemorrhage, acute infarction or space-occupying mass lesion are noted. Vascular: No hyperdense vessel or unexpected calcification. Skull: Normal. Negative for fracture or focal lesion. Sinuses/Orbits: No acute finding. Other: None. IMPRESSION: Atrophic changes without acute abnormality. Electronically Signed   By: Inez Catalina M.D.   On: 03/03/2018 08:39   Dg Abd 2 Views  Result Date: 03/03/2018 CLINICAL DATA:  Abdominal pain and swelling EXAM: ABDOMEN - 2 VIEW COMPARISON:  Four days ago FINDINGS: Percutaneous gastrostomy tube with T tack in place. Oral contrast is still seen within the colon. Elsewhere there is gaseous distension of bowel. Bilateral internal ureteral stents. Bowel is preferentially on the right due to large pelvic and left abdominal mass by CT. IMPRESSION: Continued ileus findings with contrast remaining within the colon. There may be superimposed partial obstruction due to the patient's bulky left abdominal and pelvic mass. Electronically Signed   By: Monte Fantasia M.D.   On: 03/03/2018 13:13

## 2018-03-04 NOTE — Progress Notes (Signed)
63ml of Morphine 1mg /65ml wasted in sharps. Pt switched to Dilaudid gtt. Wasted with Consuela Mimes, RN.

## 2018-03-05 NOTE — Progress Notes (Signed)
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 Comfort measures only  Completebowel obstruction She has venting gastrostomy tube placement.  Goals of care DNR, comfort measures only. Continue pain medicine as needed along with supportive care  Severe cancer pain,improved on PCA Iappreciated assistant frompalliative care consult for symptom management and assistance in severe cancer pain management  Discharge planning I anticipate hospital death within the next few days.  [name], MD 03/05/2018  8:12 AM   Subjective:  She is intermittently alert.  She is surrounded by family members.  Objective:  Vitals:   03/03/18 2300 03/05/18 0632  BP:  134/87  Pulse:  (!) 133  Resp:  17  Temp:  97.8 F (36.6 C)  SpO2: 92% 100%     Intake/Output Summary (Last 24 hours) at 03/05/2018 6837 Last data filed at 03/05/2018 0300 Gross per 24 hour  Intake 381.73 ml  Output 250 ml  Net 131.73 ml    GENERAL: Intermittently alert, no distress, appears comfortable   Labs:  Lab Results  Component Value Date   WBC 18.3 (H) 03/04/2018   HGB 7.6 (L) 03/04/2018   HCT 23.4 (L) 03/04/2018   MCV 79.1 03/04/2018   PLT 283 03/04/2018   NEUTROABS 17.3 (H) 03/04/2018    Lab Results  Component Value Date   NA 139 03/04/2018   K 3.3 (L) 03/04/2018   CL 94 (L) 03/04/2018   CO2 30 03/04/2018    Studies:  Ct Head Wo Contrast  Result Date: 03/03/2018 CLINICAL DATA:  Altered mental status EXAM: CT HEAD WITHOUT CONTRAST TECHNIQUE: Contiguous axial images were obtained from the base of the skull through the vertex without intravenous contrast. COMPARISON:  None. FINDINGS: Brain: Mild atrophic changes are identified. No findings to suggest acute hemorrhage, acute infarction or space-occupying mass lesion are noted.  Vascular: No hyperdense vessel or unexpected calcification. Skull: Normal. Negative for fracture or focal lesion. Sinuses/Orbits: No acute finding. Other: None. IMPRESSION: Atrophic changes without acute abnormality. Electronically Signed   By: Inez Catalina M.D.   On: 03/03/2018 08:39   Dg Abd 2 Views  Result Date: 03/03/2018 CLINICAL DATA:  Abdominal pain and swelling EXAM: ABDOMEN - 2 VIEW COMPARISON:  Four days ago FINDINGS: Percutaneous gastrostomy tube with T tack in place. Oral contrast is still seen within the colon. Elsewhere there is gaseous distension of bowel. Bilateral internal ureteral stents. Bowel is preferentially on the right due to large pelvic and left abdominal mass by CT. IMPRESSION: Continued ileus findings with contrast remaining within the colon. There may be superimposed partial obstruction due to the patient's bulky left abdominal and pelvic mass. Electronically Signed   By: Monte Fantasia M.D.   On: 03/03/2018 13:13

## 2018-03-05 NOTE — Progress Notes (Signed)
   03/05/18 1200  Clinical Encounter Type  Visited With Family  Visit Type Follow-up;Psychological support;Spiritual support  Referral From Family  Consult/Referral To Chaplain  Spiritual Encounters  Spiritual Needs Emotional;Other (Comment) (Spiritual Care Conversation/support)  Stress Factors  Patient Stress Factors Not reviewed  Family Stress Factors Health changes;Major life changes   I visited with the patient's sister who was present in the room as a follow-up. No needs were present at this time. The family is content with the fact that the patient is comfortable, which was their main concern.   Please, contact Spiritual Care for further assistance.   Chaplain Shanon Ace M.Div., [facility]

## 2018-03-05 NOTE — Progress Notes (Signed)
PROGRESS NOTE    Anita Whitehead  YQM:[id number] DOB: [birth year] DOA: 02/27/2018 PCP: [name], NP    Brief Narrative:  62 year old with past medical history relevant for remote history of cervical cancer status post radiation, hypertension, hyperlipidemia, known left-sided high-grade sarcoma who was admitted with partial small bowel obstruction to [facility] on 03/04/2018 and was noted to have mild AKI and bilateral left greater than right hydronephrosis.  She initially had mild improvement of her partial small bowel obstruction as she was not deemed a surgical candidate by general surgery and was tolerating clear liquids.  She was transferred to [facility] on 02/23/2018 and on 02/13/2018 received bilateral ureteral stents.  She is currently now n.p.o. due to complete bowel obstruction and has an NG tube and is status post venting gastrostomy.  She was receiving chemotherapy in the hopes that it would relieve some of the obstruction and allow her to use her bowel however her condition is deteriorated dramatically with worsening hypoxia and she is transition to comfort care.   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[place]) Active Problems:   Essential hypertension   Cancer associated pain   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(HCC)   Left leg swelling   Dehydration   Nausea with vomiting   Partial small bowel obstruction (HCC)   Dehydration with hyponatremia   Asymptomatic bacteriuria   Mononeuropathy of left lower limb   Sarcoma (HCC)  #) Metastatic high-grade retroperitoneal sarcoma, gated by malignant small bowel obstruction, gated by lethargy/altered mental status/hypoxia: Currently patient is comfort care, she is doing well -Discontinue non-comfort care treatment -Continue PRN lorazepam IV 1 mg every 4 hours -Continue PRN haloperidol IV 2 mg every 4 hours -Continue glycopyrrolate 0.1 mg twice daily -Continue hydromorphone infusion 1 mg/h with 1 mg  boluses -palliative care is following   #) Bilateral hydronephrosis/UTI/hypertension/tachycardia/anemia: Status post bilateral ureteral stents on 02/21/2018 - On comfort care now  #) Anemia: Likely related to malignancy and iron deficiency. -Status post a dose of IV iron on 02/22/2018 and 02/26/2018  #) Hypertension/tachycardia: Improved with IV metoprolol -Patient is on carvedilol 12.5 mg twice daily at home, -Discontinue e IV metoprolol tartrate's 5 mg every 6 hours here  Fluids: P.o. ad lib. Elect lites: Not applicable Nutrition: P.o. ad lib.  Prophylaxis: None  Disposition: Pending end of life or transfer to inpatient hospice  DO NOT RESUSCITATE, comfort care  Consultants:   Palliative care  Oncology  Urology  Interventional radiology  Procedures:   02/09/2018 status post bilateral ureteral stents  Antimicrobials:   Continue cefazolin started 02/27/2018 to 03/04/2018   Subjective: Patient sleeping and minimally responsive in bed.  Her family reports that she does not appear to be much pain. Objective: Vitals:   03/03/18 1620 03/03/18 2157 03/03/18 2300 03/05/18 0632  BP: (!) 151/100 (!) 143/90  134/87  Pulse: (!) 128 (!) 119  (!) 133  Resp:  14  17  Temp:  98.7 F (37.1 C)  97.8 F (36.6 C)  TempSrc:  Oral  Oral  SpO2: 92% (!) 89% 92% 100%  Weight:      Height:        Intake/Output Summary (Last 24 hours) at 03/05/2018 1015 Last data filed at 03/05/2018 0300 Gross per 24 hour  Intake 381.73 ml  Output 250 ml  Net 131.73 ml   Filed Weights   02/09/2018 1121 02/08/2018 2300  Weight: 48.5 kg (107 lb) 47.6 kg (104 lb 15 oz)  Examination:  General exam: Sleeping in bed, no acute distress Respiratory system: No increased work of breathing, diminished lung sounds at bases Cardiovascular system: Tachycardic, regular rhythm, no murmurs Gastrointestinal system: Distended, tender to palpation, no rebound or guarding, no bowel sounds Central nervous system:  Not alert oriented Extremities: Left greater than right 2+ lower extremity edema Skin: Picc site is clean dry and intact, venting G-tube site is clean dry and intact Psychiatry: Unable to assess due to medical condition    Data Reviewed: I have personally reviewed following labs and imaging studies  CBC: Recent Labs  Lab 02/27/18 0631 02/28/18 0558 03/01/18 0559 03/03/18 0608 03/04/18 0512  WBC 27.8* 27.6* 27.1* 20.9* 18.3*  NEUTROABS  --   --  25.0* 18.8* 17.3*  HGB 9.2* 8.5* 8.2* 7.7* 7.6*  HCT 28.2* 25.9* 24.8* 23.0* 23.4*  MCV 78.1 78.2 79.2 78.8 79.1  PLT 525* 458* 379 350 295   Basic Metabolic Panel: Recent Labs  Lab 02/28/18 0558 03/01/18 0559 03/02/18 0535 03/03/18 0608 03/04/18 0512  NA 143 148* 152* 153* 139  K 3.9 3.9 4.3 4.7 3.3*  CL 114* 117* 107 102 94*  CO2 18* 20* 33* 34* 30  GLUCOSE 88 74 125* 110* 156*  BUN 17 18 15 16 19   CREATININE 0.96 0.89 0.78 0.93 1.19*  CALCIUM 8.7* 8.8* 8.4* 8.3* 7.8*  MG 1.9  --  2.1 2.3 1.9   GFR: Estimated Creatinine Clearance: 38.3 mL/min (A) (by C-G formula based on SCr of 1.19 mg/dL (H)). Liver Function Tests: Recent Labs  Lab 03/02/18 0535 03/03/18 0608 03/04/18 0512  AST 14* 23 24  ALT 5 6 5   ALKPHOS 52 56 52  BILITOT 0.3 0.2* 0.6  PROT 5.4* 5.7* 5.0*  ALBUMIN 2.2* 2.2* 2.4*   No results for input(s): LIPASE, AMYLASE in the last 168 hours. Recent Labs  Lab 03/03/18 0610  AMMONIA 16   Coagulation Profile: Recent Labs  Lab 02/27/18 1300  INR 1.16   Cardiac Enzymes: No results for input(s): CKTOTAL, CKMB, CKMBINDEX, TROPONINI in the last 168 hours. BNP (last 3 results) No results for input(s): PROBNP in the last 8760 hours. HbA1C: No results for input(s): HGBA1C in the last 72 hours. CBG: No results for input(s): GLUCAP in the last 168 hours. Lipid Profile: No results for input(s): CHOL, HDL, LDLCALC, TRIG, CHOLHDL, LDLDIRECT in the last 72 hours. Thyroid Function Tests: No results for  input(s): TSH, T4TOTAL, FREET4, T3FREE, THYROIDAB in the last 72 hours. Anemia Panel: No results for input(s): VITAMINB12, FOLATE, FERRITIN, TIBC, IRON, RETICCTPCT in the last 72 hours. Sepsis Labs: Recent Labs  Lab 03/03/18 1884 03/04/18 0512  PROCALCITON 0.70 0.52    Recent Results (from the past 240 hour(s))  Culture, Urine     Status: None   Collection Time: 02/26/18  1:35 PM  Result Value Ref Range Status   Specimen Description   Final    URINE, CLEAN CATCH Performed at [facility] [address], [place], [place] [address]    Special Requests   Final    NONE Performed at [facility], [place] [address], [place], [place] [address]    Culture   Final    NO GROWTH Performed at [facility], [place] [address], [place], [place] [address]    Report Status 02/27/2018 FINAL  Final         Radiology Studies: Dg Abd 2 Views  Result Date: 03/03/2018 CLINICAL DATA:  Abdominal pain and  swelling EXAM: ABDOMEN - 2 VIEW COMPARISON:  Four days ago FINDINGS: Percutaneous gastrostomy tube with T tack in place. Oral contrast is still seen within the colon. Elsewhere there is gaseous distension of bowel. Bilateral internal ureteral stents. Bowel is preferentially on the right due to large pelvic and left abdominal mass by CT. IMPRESSION: Continued ileus findings with contrast remaining within the colon. There may be superimposed partial obstruction due to the patient's bulky left abdominal and pelvic mass. Electronically Signed   By: Monte Fantasia M.D.   On: 03/03/2018 13:13        Scheduled Meds: . glycopyrrolate  0.2 mg Intravenous BID   Continuous Infusions: . sodium chloride 10 mL/hr at 03/01/18 1055  . HYDROmorphone 1 mg/hr (03/05/18 0218)  . ondansetron (ZOFRAN) IV       LOS: 13 days    Time spent: [place], MD [facility]  If 7PM-7AM, please contact night-coverage [email or web address] Password  [email or web address] 03/05/2018, 10:15 AM

## 2018-03-05 NOTE — Progress Notes (Signed)
Palliative:  I met at Anita Whitehead's bedside and her sister from [place] is present. I introduced myself and inquired to Anita Whitehead's comfort. Family reports that she has seemed comfortable and mostly resting. Anita Whitehead briefly moved and opened her eyes but did not turn towards [place] and this only lasted a few moments. Family reports that the medication has helped when she seems to become a little restless. Emotional support provided. Ensured my contact was at bedside.   No charge  Anita Sill, NP Palliative Medicine Team Pager # [phone] (M-F 8a-5p) Team Phone # [phone] (Nights/Weekends)

## 2018-03-06 NOTE — Progress Notes (Signed)
I saw the patient briefly. She appears comfortable. I discussed with family members.  I expected she will pass in less than 3 days.

## 2018-03-06 NOTE — Progress Notes (Signed)
PROGRESS NOTE    Anita Whitehead  SWH:[id number] DOB: April 03, 1956 DOA: 02/13/2018 PCP: [name], NP   Brief Narrative:  The patient is a 62 year old with past medical history relevant for remote history of cervical cancer status post radiation, hypertension, hyperlipidemia, known left-sided high-grade sarcoma who was admitted with partial small bowel obstruction to [facility] on 02/27/2018 and was noted to have mild AKI and bilateral left greater than right hydronephrosis.  She initially had mild improvement of her partial small bowel obstruction as she was not deemed a surgical candidate by general surgery and was tolerating clear liquids.  She was transferred to [facility] on 02/23/2018 and on 03/06/2018 received bilateral ureteral stents. She n.p.o. due to complete bowel obstruction and has an NG tube and is status post venting gastrostomy.  She was receiving chemotherapy in the hopes that it would relieve some of the obstruction and allow her to use her bowel however her condition is deteriorated dramatically with worsening hypoxia and she was transitioned to comfort care with family on board after discussion with palliative Care. Patient is a high risk for decompensation further and anticipate in-hospital death.  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[place]) Active Problems:   Essential hypertension   Cancer associated pain   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(HCC)   Left leg swelling   Dehydration   Nausea with vomiting   Partial small bowel obstruction (HCC)   Dehydration with hyponatremia   Asymptomatic bacteriuria   Mononeuropathy of left lower limb   Sarcoma (HCC)  Metastatic high-grade retroperitoneal sarcoma, complicated by malignant small bowel obstruction and now complicated by lethargy/altered mental status/hypoxia -Currently patient is comfort care and resting comfortably with NRB -Discontinued non-comfort care treatment -Continue PRN Lorazepam IV 1 mg  every 4 hours -Continue PRN Haloperidol IV 2 mg every 4 hours -Continue IV Glycopyrrolate 0.2 mg twice daily and q4hprn Gurgling and Secretions  -Continue Hydromorphone infusion 1 mg/h with 1 mg boluses q1hprn Pain and SOB -C/w Prochlorperazine 10 mg IV q6hprn N/V and C/w Ondansetron 8 mg IV q8hprn Nausea  -Palliative care is following  Bilateral hydronephrosis/UTI/hypertension/tachycardia/anemia: Status post bilateral ureteral stents on 02/06/2018 -Comofort Measures   Anemia: Likely related to malignancy and iron deficiency. -Status post a dose of IV iron on 02/22/2018 and 02/26/2018 -Will not check bloodwork again as patient is Comfort Care   Hypertension/Tachycardia -Improved with IV metoprolol -Patient is on carvedilol 12.5 mg twice daily at home but will not continue as she is Comfort Care -Discontinued IV metoprolol tartrate's 5 mg every 6 hours here given transition to [place]  DVT prophylaxis: None Comfort Care Code Status: DO NOT RESUSCITATE  Family Communication: Discussed with sister at bedside  Disposition Plan: High risk for decompensation and expect In Hospital Death  Consultants:   [place]   Urology  Interventional Radiology    Procedures:   02/05/2018 status post bilateral ureteral stents  Antimicrobials:  Anti-infectives (From admission, onward)   Start     Dose/Rate Route Frequency Ordered Stop   02/27/18 1000  ceFAZolin (ANCEF) IVPB 1 g/50 mL premix  Status:  Discontinued     1 g 100 mL/hr over 30 Minutes Intravenous Every 8 hours 02/27/18 0820 03/04/18 0748   02/10/2018 1000  ceFAZolin (ANCEF) IVPB 1 g/50 mL premix  Status:  Discontinued     1 g 100 mL/hr over 30 Minutes Intravenous Every 12 hours 02/23/2018 0741 02/27/18 0820   02/14/2018 6659  ceFAZolin (ANCEF) 2-4 GM/100ML-% IVPB    Note to Pharmacy:  Chrystine Oiler: cabinet override      02/23/2018 0942 02/20/2018 1000   02/22/18 1800   sulfamethoxazole-trimethoprim (BACTRIM,SEPTRA) 400-80 MG per tablet 1 tablet  Status:  Discontinued     1 tablet Oral Every 12 hours 02/22/18 1620 02/21/2018 0741   02/15/2018 1445  cefTRIAXone (ROCEPHIN) 1 g in sodium chloride 0.9 % 100 mL IVPB     1 g 200 mL/hr over 30 Minutes Intravenous  Once 02/16/2018 1432 02/13/2018 1515     Subjective: Patient resting comfortably in not responsive to physical verbal stimuli.  Wearing nonrebreather.  Sister is at bedside and states that she got a little agitated overnight but does seem to be resting better now.  Objective: Vitals:   03/03/18 2300 03/05/18 0632 03/05/18 2221 03/06/18 0635  BP:  134/87 104/82 (!) 132/91  Pulse:  (!) 133 (!) 144 (!) 133  Resp:  17  16  Temp:  97.8 F (36.6 C) 98.5 F (36.9 C) 98.8 F (37.1 C)  TempSrc:  Oral Oral Oral  SpO2: 92% 100% 100% 100%  Weight:      Height:        Intake/Output Summary (Last 24 hours) at 03/06/2018 1256 Last data filed at 03/06/2018 0321 Gross per 24 hour  Intake 320.44 ml  Output -  Net 320.44 ml   Filed Weights   02/19/2018 1121 02/26/2018 2300  Weight: 48.5 kg (107 lb) 47.6 kg (104 lb 15 oz)   Examination: Physical Exam:  Constitutional: Thin Female resting comfortably in NAD ENMT: External Ears, Nose appear normal.  Respiratory: Diminished to auscultation bilaterally, no wheezing, rales, rhonchi or crackles. Wearing NRB Cardiovascular: Tachycardic Rate but regular rhythm, no murmurs / rubs / gallops. S1 and S2 auscultated. No extremity edema.  Abdomen: Distended and mildly tender to palpate.  GU: Deferred. Neurologic: Not awake to participate in Neuro Exam.  Psychiatric: Unable to assess 2/2 to current condition    Data Reviewed: I have personally reviewed following labs and imaging studies  CBC: Recent Labs  Lab 02/28/18 0558 03/01/18 0559 03/03/18 0608 03/04/18 0512  WBC 27.6* 27.1* 20.9* 18.3*  NEUTROABS  --  25.0* 18.8* 17.3*  HGB 8.5* 8.2* 7.7* 7.6*  HCT 25.9* 24.8*  23.0* 23.4*  MCV 78.2 79.2 78.8 79.1  PLT 458* 379 350 678   Basic Metabolic Panel: Recent Labs  Lab 02/28/18 0558 03/01/18 0559 03/02/18 0535 03/03/18 0608 03/04/18 0512  NA 143 148* 152* 153* 139  K 3.9 3.9 4.3 4.7 3.3*  CL 114* 117* 107 102 94*  CO2 18* 20* 33* 34* 30  GLUCOSE 88 74 125* 110* 156*  BUN 17 18 15 16 19   CREATININE 0.96 0.89 0.78 0.93 1.19*  CALCIUM 8.7* 8.8* 8.4* 8.3* 7.8*  MG 1.9  --  2.1 2.3 1.9   GFR: Estimated Creatinine Clearance: 38.3 mL/min (A) (by C-G formula based on SCr of 1.19 mg/dL (H)). Liver Function Tests: Recent Labs  Lab 03/02/18 0535 03/03/18 0608 03/04/18 0512  AST 14* 23 24  ALT 5 6 5   ALKPHOS 52 56 52  BILITOT 0.3 0.2* 0.6  PROT 5.4* 5.7* 5.0*  ALBUMIN 2.2* 2.2* 2.4*   No results for input(s): LIPASE, AMYLASE in the last 168 hours. Recent Labs  Lab 03/03/18 0610  AMMONIA 16   Coagulation Profile: Recent Labs  Lab 02/27/18 1300  INR 1.16   Cardiac Enzymes: No results for input(s): CKTOTAL, CKMB, CKMBINDEX, TROPONINI  in the last 168 hours. BNP (last 3 results) No results for input(s): PROBNP in the last 8760 hours. HbA1C: No results for input(s): HGBA1C in the last 72 hours. CBG: No results for input(s): GLUCAP in the last 168 hours. Lipid Profile: No results for input(s): CHOL, HDL, LDLCALC, TRIG, CHOLHDL, LDLDIRECT in the last 72 hours. Thyroid Function Tests: No results for input(s): TSH, T4TOTAL, FREET4, T3FREE, THYROIDAB in the last 72 hours. Anemia Panel: No results for input(s): VITAMINB12, FOLATE, FERRITIN, TIBC, IRON, RETICCTPCT in the last 72 hours. Sepsis Labs: Recent Labs  Lab 03/03/18 3646 03/04/18 0512  PROCALCITON 0.70 0.52    Recent Results (from the past 240 hour(s))  Culture, Urine     Status: None   Collection Time: 02/26/18  1:35 PM  Result Value Ref Range Status   Specimen Description   Final    URINE, CLEAN CATCH Performed at [facility] [address],  [place], [place] [address]    Special Requests   Final    NONE Performed at [facility], [place] [address], [place], [place] [address]    Culture   Final    NO GROWTH Performed at [facility], [place] [address], [place], [place] [address]    Report Status 02/27/2018 FINAL  Final    Radiology Studies: No results found.   Scheduled Meds: . glycopyrrolate  0.2 mg Intravenous BID   Continuous Infusions: . sodium chloride 10 mL/hr at 03/01/18 1055  . HYDROmorphone 1 mg/hr (03/06/18 0321)  . ondansetron (ZOFRAN) IV      LOS: 14 days   Kerney Elbe, DO [facility] Pager [phone]  If 7PM-7AM, please contact night-coverage [email or web address] Password [facility] 03/06/2018, 12:56 PM

## 2018-03-06 NOTE — Progress Notes (Signed)
Palliative:  Ms. Anita Whitehead is lying in bed. Multiple family members at bedside. Answered questions and offered support to family at bedside. Ms. Anita Whitehead did arouse during my visit but appeared restless and did briefly make eye contact with family member. She also nodded head "yes" clearly that she was having pain - RN gave dilaudid bolus at our request.   I did catch her husband and spoke with him outside of room. I discussed with him the oxygen mask being uncomfortable, drying, and can even prolong her suffering. He agrees that he does not want any of that for her and we agreed to place nasal cannula instead for comfort. Discussed with RN plan with oxygen and to titrate down for comfort.   Emotional support provided.   Exam: Aroused briefly during visit. Mod distress with awakening. Breathing more shallow and irregular today.   34 min  [name], NP Palliative Medicine Team Pager # [phone] (M-F 8a-5p) Team Phone # [phone] (Nights/Weekends)

## 2018-03-07 DIAGNOSIS — Z515 Encounter for palliative care: Secondary | ICD-10-CM

## 2018-03-07 MED ORDER — LIP MEDEX EX OINT
TOPICAL_OINTMENT | CUTANEOUS | Status: AC
  Administered 2018-03-07: 13:00:00
  Filled 2018-03-07: qty 7

## 2018-03-07 NOTE — Progress Notes (Signed)
She appears comfortable on current comfort measures Family members are present

## 2018-03-07 NOTE — Progress Notes (Signed)
Palliative:  Anita Whitehead lying in bed. Appears comfortable and now on nasal cannula - decreased from 6L to 2L as this is not needed and goals for comfort. Sister at bedside and says they had a restful night last night. Discussed with RN and no current concerns or needs. Will be available at needed. Prognosis poor and likely days or less. Expect hospital death. Emotional support provided to family at bedside.   Exam: Unresponsive but did not attempt to arouse. No labored breathing or distress. No apnea noted.   15 min  [name], NP Palliative Medicine Team Pager # [phone] (M-F 8a-5p) Team Phone # [phone] (Nights/Weekends)

## 2018-03-07 NOTE — Progress Notes (Signed)
PROGRESS NOTE    Anita Whitehead  QIH:[id number] DOB: 1955-08-13 DOA: 02/11/2018 PCP: [name], NP   Brief Narrative:  The patient is a 62 year old with past medical history relevant for remote history of cervical cancer status post radiation, hypertension, hyperlipidemia, known left-sided high-grade sarcoma who was admitted with partial small bowel obstruction to [facility] on 02/23/2018 and was noted to have mild AKI and bilateral left greater than right hydronephrosis.  She initially had mild improvement of her partial small bowel obstruction as she was not deemed a surgical candidate by general surgery and was tolerating clear liquids.  She was transferred to [facility] on 02/23/2018 and on 03/03/2018 received bilateral ureteral stents. She n.p.o. due to complete bowel obstruction and has an NG tube and is status post venting gastrostomy.  She was receiving chemotherapy in the hopes that it would relieve some of the obstruction and allow her to use her bowel however her condition is deteriorated dramatically with worsening hypoxia and she was transitioned to comfort care with family on board after discussion with palliative Care. Patient is a high risk for decompensation further and anticipate in-hospital death.  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[place]) Active Problems:   Essential hypertension   Cancer associated pain   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(HCC)   Left leg swelling   Dehydration   Nausea with vomiting   Partial small bowel obstruction (HCC)   Dehydration with hyponatremia   Asymptomatic bacteriuria   Mononeuropathy of left lower limb   Sarcoma (HCC)   Terminal care  Metastatic high-grade retroperitoneal sarcoma, complicated by malignant small bowel obstruction and now complicated by lethargy/altered mental status/hypoxia -Currently patient is comfort care and resting comfortably with NRB -Discontinued non-comfort care treatment -Continue PRN  Lorazepam IV 1 mg every 4 hours -Continue PRN Haloperidol IV 2 mg every 4 hours -Continue IV Glycopyrrolate 0.2 mg twice daily and q4hprn Gurgling and Secretions  -Continue Hydromorphone infusion 1 mg/h with 1 mg boluses q1hprn Pain and SOB -C/w Prochlorperazine 10 mg IV q6hprn N/V and C/w Ondansetron 8 mg IV q8hprn Nausea  -Palliative care is following  Bilateral hydronephrosis/UTI/hypertension/tachycardia/anemia: Status post bilateral ureteral stents on 02/16/2018 -Comofort Measures   Anemia: Likely related to malignancy and iron deficiency. -Status post a dose of IV iron on 02/22/2018 and 02/26/2018 -Will not check bloodwork again as patient is [place]   Hypertension/Tachycardia -Was Improved with IV Metoprolol but no longer on that so is Tachycardic again  -Patient is on carvedilol 12.5 mg twice daily at home but will not continue as she is Comfort Care -Discontinued IV metoprolol tartrate's 5 mg every 6 hours here given transition to [place]  DVT prophylaxis: None Comfort Care Code Status: DO NOT RESUSCITATE  Family Communication: Discussed with sister at bedside and other family at bedside  Disposition Plan: High risk for decompensation and expect In Hospital Death  Consultants:   [place]   Urology  Interventional Radiology    Procedures:   02/10/2018 status post bilateral ureteral stents  Antimicrobials:  Anti-infectives (From admission, onward)   Start     Dose/Rate Route Frequency Ordered Stop   02/27/18 1000  ceFAZolin (ANCEF) IVPB 1 g/50 mL premix  Status:  Discontinued     1 g 100 mL/hr over 30 Minutes Intravenous Every 8 hours 02/27/18 0820 03/04/18 0748   02/15/2018 1000  ceFAZolin (ANCEF) IVPB 1 g/50 mL premix  Status:  Discontinued  1 g 100 mL/hr over 30 Minutes Intravenous Every 12 hours 02/23/2018 0741 02/27/18 0820   03/02/2018 0942  ceFAZolin (ANCEF) 2-4 GM/100ML-% IVPB    Note to Pharmacy:  Chrystine Oiler: cabinet override      02/10/2018 0942 03/03/2018 1000   02/22/18 1800  sulfamethoxazole-trimethoprim (BACTRIM,SEPTRA) 400-80 MG per tablet 1 tablet  Status:  Discontinued     1 tablet Oral Every 12 hours 02/22/18 1620 02/18/2018 0741   03/05/2018 1445  cefTRIAXone (ROCEPHIN) 1 g in sodium chloride 0.9 % 100 mL IVPB     1 g 200 mL/hr over 30 Minutes Intravenous  Once 02/15/2018 1432 03/05/2018 1515     Subjective:  Seen and examined at bedside and again patient was resting comfortably. She was slightly responsive to physical and verbal stimuli and would open her eyes and briefly aroused during the encounter. Family states that she has been resting comfortably and had no issues. Has been weaned off of her nonrebreather mask and is now on nasal cannula.  No other concerns or complaints at this time.  Objective: Vitals:   03/05/18 2221 03/06/18 0635 03/06/18 2131 03/07/18 0632  BP: 104/82 (!) 132/91 111/78 122/76  Pulse: (!) 144 (!) 133 (!) 125 (!) 116  Resp:  16 16 14   Temp: 98.5 F (36.9 C) 98.8 F (37.1 C) 98.3 F (36.8 C) 98.6 F (37 C)  TempSrc: Oral Oral Oral Oral  SpO2: 100% 100% 100% 100%  Weight:      Height:        Intake/Output Summary (Last 24 hours) at 03/07/2018 1211 Last data filed at 03/07/2018 7902 Gross per 24 hour  Intake 254.71 ml  Output 450 ml  Net -195.29 ml   Filed Weights   02/16/2018 1121 02/05/2018 2300  Weight: 48.5 kg (107 lb) 47.6 kg (104 lb 15 oz)   Examination: Physical Exam:  Constitutional: An African-American female currently resting in bed comfortably in no acute distress ENMT: Grossly normal hearing.  Briefly open eyes and sclera appeared anicteric Respiratory: Diminished but no appreciable wheezing, rales, rhonchi.  Breathing is unlabored and she is on nasal cannula now Cardiovascular: Tachycardic rate but regular rhythm.  No appreciable murmurs, rubs, gallops.  Trace lower extremity edema Abdomen: Soft, tender to palpate.  Bowel sounds  present GU: Deferred Neurologic: Limited due to patient participation but cranial nerves II through XII grossly intact Psychiatric: Appears calm. Has a sleepy mood and affect  Data Reviewed: I have personally reviewed following labs and imaging studies  CBC: Recent Labs  Lab 03/01/18 0559 03/03/18 0608 03/04/18 0512  WBC 27.1* 20.9* 18.3*  NEUTROABS 25.0* 18.8* 17.3*  HGB 8.2* 7.7* 7.6*  HCT 24.8* 23.0* 23.4*  MCV 79.2 78.8 79.1  PLT 379 350 409   Basic Metabolic Panel: Recent Labs  Lab 03/01/18 0559 03/02/18 0535 03/03/18 0608 03/04/18 0512  NA 148* 152* 153* 139  K 3.9 4.3 4.7 3.3*  CL 117* 107 102 94*  CO2 20* 33* 34* 30  GLUCOSE 74 125* 110* 156*  BUN 18 15 16 19   CREATININE 0.89 0.78 0.93 1.19*  CALCIUM 8.8* 8.4* 8.3* 7.8*  MG  --  2.1 2.3 1.9   GFR: Estimated Creatinine Clearance: 38.3 mL/min (A) (by C-G formula based on SCr of 1.19 mg/dL (H)). Liver Function Tests: Recent Labs  Lab 03/02/18 0535 03/03/18 0608 03/04/18 0512  AST 14* 23 24  ALT 5 6 5   ALKPHOS 52 56 52  BILITOT 0.3 0.2* 0.6  PROT  5.4* 5.7* 5.0*  ALBUMIN 2.2* 2.2* 2.4*   No results for input(s): LIPASE, AMYLASE in the last 168 hours. Recent Labs  Lab 03/03/18 0610  AMMONIA 16   Coagulation Profile: No results for input(s): INR, PROTIME in the last 168 hours. Cardiac Enzymes: No results for input(s): CKTOTAL, CKMB, CKMBINDEX, TROPONINI in the last 168 hours. BNP (last 3 results) No results for input(s): PROBNP in the last 8760 hours. HbA1C: No results for input(s): HGBA1C in the last 72 hours. CBG: No results for input(s): GLUCAP in the last 168 hours. Lipid Profile: No results for input(s): CHOL, HDL, LDLCALC, TRIG, CHOLHDL, LDLDIRECT in the last 72 hours. Thyroid Function Tests: No results for input(s): TSH, T4TOTAL, FREET4, T3FREE, THYROIDAB in the last 72 hours. Anemia Panel: No results for input(s): VITAMINB12, FOLATE, FERRITIN, TIBC, IRON, RETICCTPCT in the last 72  hours. Sepsis Labs: Recent Labs  Lab 03/03/18 2162 03/04/18 0512  PROCALCITON 0.70 0.52    Recent Results (from the past 240 hour(s))  Culture, Urine     Status: None   Collection Time: 02/26/18  1:35 PM  Result Value Ref Range Status   Specimen Description   Final    URINE, CLEAN CATCH Performed at [facility] [address], [place], [place] [address]    Special Requests   Final    NONE Performed at [facility], [place] [address] [place], [place] [address]    Culture   Final    NO GROWTH Performed at [facility], [place] [address], [place], [place] [address]    Report Status 02/27/2018 FINAL  Final    Radiology Studies: No results found.   Scheduled Meds: . glycopyrrolate  0.2 mg Intravenous BID  . lip balm       Continuous Infusions: . sodium chloride 10 mL/hr at 03/01/18 1055  . HYDROmorphone 1 mg/hr (03/07/18 0511)  . ondansetron (ZOFRAN) IV      LOS: 15 days   Kerney Elbe, DO [facility] Pager [phone]  If 7PM-7AM, please contact night-coverage [email or web address] Password [email or web address] 03/07/2018, 12:11 PM

## 2018-03-07 DEATH — deceased

## 2018-03-08 NOTE — Progress Notes (Signed)
3 ML of Dilaudid drip wasted with Dorthula Rue, RN.

## 2018-03-08 NOTE — Progress Notes (Signed)
PROGRESS NOTE    Anita Whitehead  WLS:[id number] DOB: July 17, 1956 DOA: 03/03/2018 PCP: [name], NP   Brief Narrative:  The patient is a 62 year old with past medical history relevant for remote history of cervical cancer status post radiation, hypertension, hyperlipidemia, known left-sided high-grade sarcoma who was admitted with partial small bowel obstruction to [facility] on 02/21/2018 and was noted to have mild AKI and bilateral left greater than right hydronephrosis.  She initially had mild improvement of her partial small bowel obstruction as she was not deemed a surgical candidate by general surgery and was tolerating clear liquids.  She was transferred to [facility] on 02/23/2018 and on 02/18/2018 received bilateral ureteral stents. She n.p.o. due to complete bowel obstruction and has an NG tube and is status post venting gastrostomy.  She was receiving chemotherapy in the hopes that it would relieve some of the obstruction and allow her to use her bowel however her condition is deteriorated dramatically with worsening hypoxia and she was transitioned to comfort care with family on board after discussion with palliative Care. Patient is a high risk for decompensation further and anticipate in-hospital death. Broached the subject of Residential Hospice but Oncology feels death is imminent and less than 3 days so they do not recommend transferring her to residential hospice.   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[place]) Active Problems:   Essential hypertension   Cancer associated pain   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(HCC)   Left leg swelling   Dehydration   Nausea with vomiting   Partial small bowel obstruction (HCC)   Dehydration with hyponatremia   Asymptomatic bacteriuria   Mononeuropathy of left lower limb   Sarcoma (HCC)   Terminal care  Metastatic high-grade retroperitoneal sarcoma, complicated by malignant small bowel obstruction and now complicated by  lethargy/altered mental status/hypoxia -Currently patient is comfort care and resting comfortably with NRB -Discontinued non-comfort care treatment -Continue PRN Lorazepam IV 1 mg every 4 hours -Continue PRN Haloperidol IV 2 mg every 4 hours -Continue IV Glycopyrrolate 0.2 mg twice daily and q4hprn Gurgling and Secretions  -Continue Hydromorphone infusion 1 mg/h with 1 mg boluses q1hprn Pain and SOB -C/w Prochlorperazine 10 mg IV q6hprn N/V and C/w Ondansetron 8 mg IV q8hprn Nausea  -Palliative care and Medical Oncology following   Bilateral hydronephrosis/UTI/hypertension/tachycardia/anemia: Status post bilateral ureteral stents on 02/18/2018 -Comofort Measures   Anemia: Likely related to malignancy and iron deficiency. -Status post a dose of IV iron on 02/22/2018 and 02/26/2018 -Will not check bloodwork again as patient is [place]   Hypertension/Tachycardia -Was Improved with IV Metoprolol but no longer on that so is Tachycardic again  -Patient is on carvedilol 12.5 mg twice daily at home but will not continue as she is Comfort Care -Discontinued IV metoprolol tartrate's 5 mg every 6 hours here given transition to [place]  DVT prophylaxis: None Comfort Care Code Status: DO NOT RESUSCITATE  Family Communication: Discussed with sister at bedside and other family at bedside  Disposition Plan: High risk for decompensation and expect In Hospital Death  Consultants:   [place]   Urology  Interventional Radiology    Procedures:   03/01/2018 status post bilateral ureteral stents  Antimicrobials:  Anti-infectives (From admission, onward)   Start     Dose/Rate Route Frequency Ordered Stop   02/27/18 1000  ceFAZolin (ANCEF) IVPB 1 g/50 mL premix  Status:  Discontinued     1 g 100 mL/hr  over 30 Minutes Intravenous Every 8 hours 02/27/18 0820 03/04/18 0748   02/07/2018 1000  ceFAZolin (ANCEF) IVPB 1 g/50 mL premix  Status:  Discontinued       1 g 100 mL/hr over 30 Minutes Intravenous Every 12 hours 02/12/2018 0741 02/27/18 0820   02/14/2018 0942  ceFAZolin (ANCEF) 2-4 GM/100ML-% IVPB    Note to Pharmacy:  Chrystine Oiler: cabinet override      02/16/2018 0942 02/21/2018 1000   02/22/18 1800  sulfamethoxazole-trimethoprim (BACTRIM,SEPTRA) 400-80 MG per tablet 1 tablet  Status:  Discontinued     1 tablet Oral Every 12 hours 02/22/18 1620 02/13/2018 0741   02/25/2018 1445  cefTRIAXone (ROCEPHIN) 1 g in sodium chloride 0.9 % 100 mL IVPB     1 g 200 mL/hr over 30 Minutes Intravenous  Once 02/15/2018 1432 02/13/2018 1515     Subjective:  Seen and examined at bedside and is resting comfortably.  Sister states that she does ask for water.  Patient opens her eyes to verbal and physical stimuli but mumbles to me.  No complaints per sister and states that she rested well overnight.  Objective: Vitals:   03/06/18 2131 03/07/18 0632 03/07/18 2136 03/08/18 0636  BP: 111/78 122/76 122/77 120/74  Pulse: (!) 125 (!) 116 (!) 123 (!) 112  Resp: 16 14 14 16   Temp: 98.3 F (36.8 C) 98.6 F (37 C) 98.4 F (36.9 C) 98.6 F (37 C)  TempSrc: Oral Oral Oral Oral  SpO2: 100% 100% 100% 100%  Weight:      Height:        Intake/Output Summary (Last 24 hours) at 03/08/2018 1402 Last data filed at 03/08/2018 0400 Gross per 24 hour  Intake 870.01 ml  Output -  Net 870.01 ml   Filed Weights   02/19/2018 1121 02/27/2018 2300  Weight: 48.5 kg (107 lb) 47.6 kg (104 lb 15 oz)   Examination: Physical Exam:  Constitutional: Thin African-American female currently no acute distress resting in bed ENMT: External ears and nose appear normal. Respiratory: Diminished to auscultation with mild rhonchi but has unlabored breathing is wearing supplemental oxygen via nasal cannula Cardiovascular: Tachycardic rate but regular rhythm.  Abdomen: Soft, tender to palpation GU: Deferred Neurologic: Opens eyes to verbal and physical stimuli Psychiatric: Calm but slightly  somnolent  Data Reviewed: I have personally reviewed following labs and imaging studies  CBC: Recent Labs  Lab 03/03/18 0608 03/04/18 0512  WBC 20.9* 18.3*  NEUTROABS 18.8* 17.3*  HGB 7.7* 7.6*  HCT 23.0* 23.4*  MCV 78.8 79.1  PLT 350 053   Basic Metabolic Panel: Recent Labs  Lab 03/02/18 0535 03/03/18 0608 03/04/18 0512  NA 152* 153* 139  K 4.3 4.7 3.3*  CL 107 102 94*  CO2 33* 34* 30  GLUCOSE 125* 110* 156*  BUN 15 16 19   CREATININE 0.78 0.93 1.19*  CALCIUM 8.4* 8.3* 7.8*  MG 2.1 2.3 1.9   GFR: Estimated Creatinine Clearance: 38.3 mL/min (A) (by C-G formula based on SCr of 1.19 mg/dL (H)). Liver Function Tests: Recent Labs  Lab 03/02/18 0535 03/03/18 0608 03/04/18 0512  AST 14* 23 24  ALT 5 6 5   ALKPHOS 52 56 52  BILITOT 0.3 0.2* 0.6  PROT 5.4* 5.7* 5.0*  ALBUMIN 2.2* 2.2* 2.4*   No results for input(s): LIPASE, AMYLASE in the last 168 hours. Recent Labs  Lab 03/03/18 0610  AMMONIA 16   Coagulation Profile: No results for input(s): INR, PROTIME in the last 168 hours.  Cardiac Enzymes: No results for input(s): CKTOTAL, CKMB, CKMBINDEX, TROPONINI in the last 168 hours. BNP (last 3 results) No results for input(s): PROBNP in the last 8760 hours. HbA1C: No results for input(s): HGBA1C in the last 72 hours. CBG: No results for input(s): GLUCAP in the last 168 hours. Lipid Profile: No results for input(s): CHOL, HDL, LDLCALC, TRIG, CHOLHDL, LDLDIRECT in the last 72 hours. Thyroid Function Tests: No results for input(s): TSH, T4TOTAL, FREET4, T3FREE, THYROIDAB in the last 72 hours. Anemia Panel: No results for input(s): VITAMINB12, FOLATE, FERRITIN, TIBC, IRON, RETICCTPCT in the last 72 hours. Sepsis Labs: Recent Labs  Lab 03/03/18 0608 03/04/18 0512  PROCALCITON 0.70 0.52    No results found for this or any previous visit (from the past 240 hour(s)).  Radiology Studies: No results found.   Scheduled Meds: . glycopyrrolate  0.2 mg  Intravenous BID   Continuous Infusions: . sodium chloride 10 mL/hr at 03/01/18 1055  . HYDROmorphone 1 mg/hr (03/08/18 0357)  . ondansetron (ZOFRAN) IV      LOS: 16 days   Kerney Elbe, DO [facility] Pager [phone]  If 7PM-7AM, please contact night-coverage [email or web address] Password [email or web address] 03/08/2018, 2:02 PM

## 2018-03-08 NOTE — Progress Notes (Signed)
I spoke with the patient's family The patient is asleep She appears comfortable but intermittently alert, according to family members Expected prognosis is less than 3 days I would not recommend transitioning her into residential hospice due to imminent death

## 2018-03-08 NOTE — Progress Notes (Signed)
Palliative care progress note:  Anita Whitehead is lying in bed. Appears comfortable and does not arouse to gentle verbal or tactile stimulation. Sisters at bedside report one episode of restlessness overnight that improved with ativan.   Discussed with RN and no current concerns or needs. Prognosis of hours to days.  Possibility of residential hospice discussed earlier today with Dr. Alfredia Ferguson and Dr. Alvy Bimler.  Patient does not appear stable enough to consider transition at this time.  Expect hospital death. Emotional support provided to family at bedside.   Exam: Unresponsive and did not arouse to gentle verbal or tactile stimulation. No labored breathing or distress. No apnea noted.   20 minutes Greater than 50%  of this time was spent counseling and coordinating care related to the above assessment and plan.  Micheline Rough, MD [place] [facility] [phone]

## 2018-03-09 LAB — PHOSPHORUS: PHOSPHORUS: 6.6 mg/dL — AB (ref 2.5–4.6)

## 2018-03-09 LAB — CBC WITH DIFFERENTIAL/PLATELET
HEMATOCRIT: 21.3 % — AB (ref 36.0–46.0)
Hemoglobin: 6.8 g/dL — CL (ref 12.0–15.0)
MCH: 25.6 pg — ABNORMAL LOW (ref 26.0–34.0)
MCHC: 31.9 g/dL (ref 30.0–36.0)
MCV: 80.1 fL (ref 78.0–100.0)
PLATELETS: 27 10*3/uL — AB (ref 150–400)
RBC: 2.66 MIL/uL — ABNORMAL LOW (ref 3.87–5.11)
RDW: 21.3 % — AB (ref 11.5–15.5)
WBC: 0.2 10*3/uL — AB (ref 4.0–10.5)

## 2018-03-09 LAB — COMPREHENSIVE METABOLIC PANEL
ALBUMIN: 2 g/dL — AB (ref 3.5–5.0)
ALT: 7 U/L (ref 0–44)
ANION GAP: 18 — AB (ref 5–15)
AST: 24 U/L (ref 15–41)
Alkaline Phosphatase: 60 U/L (ref 38–126)
BUN: 64 mg/dL — ABNORMAL HIGH (ref 6–20)
CALCIUM: 8.3 mg/dL — AB (ref 8.9–10.3)
CO2: 29 mmol/L (ref 22–32)
CREATININE: 5.47 mg/dL — AB (ref 0.44–1.00)
Chloride: 100 mmol/L (ref 98–111)
GFR, EST AFRICAN AMERICAN: 9 mL/min — AB (ref >60)
GFR, EST NON AFRICAN AMERICAN: 8 mL/min — AB (ref >60)
Glucose, Bld: 69 mg/dL — ABNORMAL LOW (ref 70–99)
Potassium: 4.8 mmol/L (ref 3.5–5.1)
SODIUM: 147 mmol/L — AB (ref 135–145)
Total Bilirubin: 1.5 mg/dL — ABNORMAL HIGH (ref 0.3–1.2)
Total Protein: 5.2 g/dL — ABNORMAL LOW (ref 6.5–8.1)

## 2018-03-09 LAB — MAGNESIUM: MAGNESIUM: 2.6 mg/dL — AB (ref 1.7–2.4)

## 2018-03-09 NOTE — Progress Notes (Signed)
CRITICAL VALUE ALERT  Critical Value:  WBC 0.2   Hgb 6.8   Plt 27  Date & Time Notied:  03/09/2018 0625  Provider Notified: [place]  Orders Received/Actions taken: No actions/orders received yet

## 2018-03-09 NOTE — Progress Notes (Signed)
Wasted 5 ml from Dilaudid piggyback, verified with Wyatt Portela, Therapist, sports.

## 2018-03-09 NOTE — Progress Notes (Signed)
PROGRESS NOTE    Anita Whitehead  OAC:[id number] DOB: 05/23/1956 DOA: 02/11/2018 PCP: [name], NP   Brief Narrative:  The patient is a 62 year old with past medical history relevant for remote history of cervical cancer status post radiation, hypertension, hyperlipidemia, known left-sided high-grade sarcoma who was admitted with partial small bowel obstruction to [facility] on 02/22/2018 and was noted to have mild AKI and bilateral left greater than right hydronephrosis.  She initially had mild improvement of her partial small bowel obstruction as she was not deemed a surgical candidate by general surgery and was tolerating clear liquids.  She was transferred to [facility] on 02/23/2018 and on 02/13/2018 received bilateral ureteral stents. She n.p.o. due to complete bowel obstruction and has an NG tube and is status post venting gastrostomy.  She was receiving chemotherapy in the hopes that it would relieve some of the obstruction and allow her to use her bowel however her condition is deteriorated dramatically with worsening hypoxia and she was transitioned to comfort care with family on board after discussion with palliative Care. Patient is a high risk for decompensation further and anticipate in-hospital death as death is imminent. [facility].   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[place]) Active Problems:   Essential hypertension   Cancer associated pain   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(HCC)   Left leg swelling   Dehydration   Nausea with vomiting   Partial small bowel obstruction (HCC)   Dehydration with hyponatremia   Asymptomatic bacteriuria   Mononeuropathy of left lower limb   Sarcoma (HCC)   Terminal care  Metastatic high-grade retroperitoneal sarcoma, complicated by malignant small bowel obstruction and now complicated by lethargy/altered mental status/hypoxia -Currently patient is comfort care and resting comfortably with  NRB -Discontinued non-comfort care treatment -Continue PRN Lorazepam IV 1 mg every 4 hours -Continue PRN Haloperidol IV 2 mg every 4 hours -Continue IV Glycopyrrolate 0.2 mg twice daily and q4hprn Gurgling and Secretions  -Continue Hydromorphone infusion 1 mg/h with 1 mg boluses q1hprn Pain and SOB -C/w Prochlorperazine 10 mg IV q6hprn N/V and C/w Ondansetron 8 mg IV q8hprn Nausea  -Palliative care and Medical Oncology following  -Will not Draw anymore labs   Bilateral hydronephrosis/UTI/hypertension/tachycardia/anemia: Status post bilateral ureteral stents on 03/01/2018 -Comofort Measures   Anemia: Likely related to malignancy and iron deficiency. -Status post a dose of IV iron on 02/22/2018 and 02/26/2018 -Hb/Hct this AM was low at 6.8/21.3 -Will not check bloodwork again as patient is [place]   Hypertension/Tachycardia -Was Improved with IV Metoprolol but no longer on that so is Tachycardic again  -Patient was on carvedilol 12.5 mg twice daily at home but will not continue as she is Comfort Care -Discontinued IV metoprolol tartrate's 5 mg every 6 hours here given transition to [place]  DVT prophylaxis: None Comfort Care Code Status: DO NOT RESUSCITATE  Family Communication: Discussed with sister at bedside Disposition Plan: High risk for decompensation and expect In Hospital Death; Patient is deteriorating   Consultants:   Medical Oncology  Palliative Care Medicine   Urology  Interventional Radiology    Procedures:   02/23/2018 status post bilateral ureteral stents  Antimicrobials:  Anti-infectives (From admission, onward)   Start     Dose/Rate Route Frequency Ordered Stop   02/27/18 1000  ceFAZolin (ANCEF) IVPB 1 g/50 mL premix  Status:  Discontinued     1 g 100 mL/hr over 30 Minutes Intravenous Every 8  hours 02/27/18 0820 03/04/18 0748   03/03/2018 1000  ceFAZolin (ANCEF) IVPB 1 g/50 mL premix  Status:  Discontinued     1 g 100 mL/hr over 30 Minutes  Intravenous Every 12 hours 03/04/2018 0741 02/27/18 0820   02/19/2018 0942  ceFAZolin (ANCEF) 2-4 GM/100ML-% IVPB    Note to Pharmacy:  Chrystine Oiler: cabinet override      02/26/2018 0942 02/28/2018 1000   02/22/18 1800  sulfamethoxazole-trimethoprim (BACTRIM,SEPTRA) 400-80 MG per tablet 1 tablet  Status:  Discontinued     1 tablet Oral Every 12 hours 02/22/18 1620 02/11/2018 0741   02/12/2018 1445  cefTRIAXone (ROCEPHIN) 1 g in sodium chloride 0.9 % 100 mL IVPB     1 g 200 mL/hr over 30 Minutes Intravenous  Once 02/19/2018 1432 02/12/2018 1515     Subjective:  Seen and examined at bedside and was slightly more difficult to rouse. Appeared comfortable but did have some rhonchous breath sounds. Labs confirming worsening condition and deterioration. Sister states pain medication was given this AM and she rested after.   Objective: Vitals:   03/07/18 2136 03/08/18 0636 03/08/18 2115 03/09/18 0452  BP: 122/77 120/74 126/76 114/84  Pulse: (!) 123 (!) 112 (!) 121 (!) 121  Resp: 14 16 19 18   Temp: 98.4 F (36.9 C) 98.6 F (37 C) 98.9 F (37.2 C) 98.3 F (36.8 C)  TempSrc: Oral Oral Oral Oral  SpO2: 100% 100% 96% 94%  Weight:      Height:        Intake/Output Summary (Last 24 hours) at 03/09/2018 1239 Last data filed at 03/09/2018 0500 Gross per 24 hour  Intake 309.68 ml  Output -  Net 309.68 ml   Filed Weights   02/11/2018 1121 02/26/2018 2300  Weight: 48.5 kg (107 lb) 47.6 kg (104 lb 15 oz)   Examination: Physical Exam:  Constitutional: Thin AAF currently resting  ENMT: External ears and nose appear normal Respiratory: Diminished with rhochronous and coarse breath sounds. Wearing supplemental O2 via [place] Cardiovascular: Tachycardic rate but regular rhythm Abdomen: Soft, Mildly tender to palpate GU: Deferred Neurologic: More difficult to arouse to verbal or physical stimuli and does not respond to physical and verbal stimuli as quickly. Opens eyes barely when name is called after several  times Psychiatric: Unable to assess properly due to current condition   Data Reviewed: I have personally reviewed following labs and imaging studies  CBC: Recent Labs  Lab 03/03/18 0608 03/04/18 0512 03/09/18 0600  WBC 20.9* 18.3* 0.2*  NEUTROABS 18.8* 17.3*  --   HGB 7.7* 7.6* 6.8*  HCT 23.0* 23.4* 21.3*  MCV 78.8 79.1 80.1  PLT 350 283 27*   Basic Metabolic Panel: Recent Labs  Lab 03/03/18 0608 03/04/18 0512 03/09/18 0440  NA 153* 139 147*  K 4.7 3.3* 4.8  CL 102 94* 100  CO2 34* 30 29  GLUCOSE 110* 156* 69*  BUN 16 19 64*  CREATININE 0.93 1.19* 5.47*  CALCIUM 8.3* 7.8* 8.3*  MG 2.3 1.9 2.6*  PHOS  --   --  6.6*   GFR: Estimated Creatinine Clearance: 8.3 mL/min (A) (by C-G formula based on SCr of 5.47 mg/dL (H)). Liver Function Tests: Recent Labs  Lab 03/03/18 0608 03/04/18 0512 03/09/18 0440  AST 23 24 24   ALT 6 5 7   ALKPHOS 56 52 60  BILITOT 0.2* 0.6 1.5*  PROT 5.7* 5.0* 5.2*  ALBUMIN 2.2* 2.4* 2.0*   No results for input(s): LIPASE, AMYLASE in the last  168 hours. Recent Labs  Lab 03/03/18 0610  AMMONIA 16   Coagulation Profile: No results for input(s): INR, PROTIME in the last 168 hours. Cardiac Enzymes: No results for input(s): CKTOTAL, CKMB, CKMBINDEX, TROPONINI in the last 168 hours. BNP (last 3 results) No results for input(s): PROBNP in the last 8760 hours. HbA1C: No results for input(s): HGBA1C in the last 72 hours. CBG: No results for input(s): GLUCAP in the last 168 hours. Lipid Profile: No results for input(s): CHOL, HDL, LDLCALC, TRIG, CHOLHDL, LDLDIRECT in the last 72 hours. Thyroid Function Tests: No results for input(s): TSH, T4TOTAL, FREET4, T3FREE, THYROIDAB in the last 72 hours. Anemia Panel: No results for input(s): VITAMINB12, FOLATE, FERRITIN, TIBC, IRON, RETICCTPCT in the last 72 hours. Sepsis Labs: Recent Labs  Lab 03/03/18 0608 03/04/18 0512  PROCALCITON 0.70 0.52    No results found for this or any previous  visit (from the past 240 hour(s)).  Radiology Studies: No results found.   Scheduled Meds: . glycopyrrolate  0.2 mg Intravenous BID   Continuous Infusions: . sodium chloride 10 mL/hr at 03/01/18 1055  . HYDROmorphone 1 mg/hr (03/09/18 0500)  . ondansetron (ZOFRAN) IV      LOS: 17 days   Kerney Elbe, DO [facility] Pager [phone]  If 7PM-7AM, please contact night-coverage [email or web address] Password [facility] 03/09/2018, 12:39 PM

## 2018-03-10 NOTE — Progress Notes (Signed)
PROGRESS NOTE    Anita Whitehead  SWH:[id number] DOB: November 21, 1955 DOA: 02/26/2018 PCP: [name], NP   Brief Narrative:  The patient is a 62 year old with past medical history relevant for remote history of cervical cancer status post radiation, hypertension, hyperlipidemia, known left-sided high-grade sarcoma who was admitted with partial small bowel obstruction to [facility] on 02/13/2018 and was noted to have mild AKI and bilateral left greater than right hydronephrosis.  She initially had mild improvement of her partial small bowel obstruction as she was not deemed a surgical candidate by general surgery and was tolerating clear liquids.  She was transferred to [facility] on 02/23/2018 and on 02/14/2018 received bilateral ureteral stents. She n.p.o. due to complete bowel obstruction and has an NG tube and is status post venting gastrostomy.  She was receiving chemotherapy in the hopes that it would relieve some of the obstruction and allow her to use her bowel however her condition is deteriorated dramatically with worsening hypoxia and she was transitioned to comfort care with family on board after discussion with palliative Care. Patient is a high risk for decompensation further and anticipate in-hospital death as death is imminent. [facility] not [facility] to [facility] at [facility] time as her Dilaudid drip requirements have been increased.    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[place]) Active Problems:   Essential hypertension   Cancer associated pain   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(HCC)   Left leg swelling   Dehydration   Nausea with vomiting   Partial small bowel obstruction (HCC)   Dehydration with hyponatremia   Asymptomatic bacteriuria   Mononeuropathy of left lower limb   Sarcoma (HCC)   Terminal care  Metastatic high-grade retroperitoneal sarcoma, complicated by malignant small bowel obstruction and now complicated by lethargy/altered mental  status/hypoxia -Currently patient is comfort care and resting comfortably with NRB -Discontinued non-comfort care treatment -Continue PRN Lorazepam IV 1 mg every 4 hours -Continue PRN Haloperidol IV 2 mg every 4 hours -Continue IV Glycopyrrolate 0.2 mg twice daily and q4hprn Gurgling and Secretions  -Continue Hydromorphone infusion 1 mg/h with 1 mg boluses q1hprn Pain and SOB; Drip Rate had to be increased overnight because restlessness and agitation  -C/w Prochlorperazine 10 mg IV q6hprn N/V and C/w Ondansetron 8 mg IV q8hprn Nausea  -Palliative care and Medical Oncology following  -Will not Draw anymore labs   Bilateral hydronephrosis/UTI/hypertension/tachycardia/anemia: Status post bilateral ureteral stents on 02/15/2018 -Comofort Measures   Anemia: Likely related to malignancy and iron deficiency. -Status post a dose of IV iron on 02/22/2018 and 02/26/2018 -Hb/Hct this AM was low at 6.8/21.3 -Will not check bloodwork again as patient is [place]   Hypertension/Tachycardia -Was Improved with IV Metoprolol but no longer on that so is Tachycardic again  -Patient was on carvedilol 12.5 mg twice daily at home but will not continue as she is Comfort Care -Discontinued IV metoprolol tartrate's 5 mg every 6 hours here given transition to [place]  DVT prophylaxis: None Comfort Care Code Status: DO NOT RESUSCITATE  Family Communication: Discussed with sister, husband, and other family at bedside  Disposition Plan: High risk for decompensation and expect In Hospital Death; Patient is deteriorating   Consultants:   Medical Oncology  Palliative Care Medicine   Urology  Interventional Radiology    Procedures:   02/12/2018 status post bilateral ureteral stents  Antimicrobials:  Anti-infectives (From admission, onward)   Start     Dose/Rate Route Frequency Ordered Stop  02/27/18 1000  ceFAZolin (ANCEF) IVPB 1 g/50 mL premix  Status:  Discontinued     1 g 100 mL/hr over  30 Minutes Intravenous Every 8 hours 02/27/18 0820 03/04/18 0748   02/20/2018 1000  ceFAZolin (ANCEF) IVPB 1 g/50 mL premix  Status:  Discontinued     1 g 100 mL/hr over 30 Minutes Intravenous Every 12 hours 02/13/2018 0741 02/27/18 0820   02/26/2018 0942  ceFAZolin (ANCEF) 2-4 GM/100ML-% IVPB    Note to Pharmacy:  Chrystine Oiler: cabinet override      02/08/2018 0942 02/08/2018 1000   02/22/18 1800  sulfamethoxazole-trimethoprim (BACTRIM,SEPTRA) 400-80 MG per tablet 1 tablet  Status:  Discontinued     1 tablet Oral Every 12 hours 02/22/18 1620 02/09/2018 0741   02/26/2018 1445  cefTRIAXone (ROCEPHIN) 1 g in sodium chloride 0.9 % 100 mL IVPB     1 g 200 mL/hr over 30 Minutes Intravenous  Once 02/08/2018 1432 02/10/2018 1515     Subjective:  Seen and examined at bedside and she was unable to participate in encounter and not responsive to physical or verbal stimuli. Sister was at bedside and was tearful. Sister states she had a very rough night and got extremely agitated and had to have her Dilauid Drip dosing increased. She was resting more comfortably now.   Objective: Vitals:   03/08/18 0636 03/08/18 2115 03/09/18 0452 03/10/18 0556  BP: 120/74 126/76 114/84 122/80  Pulse: (!) 112 (!) 121 (!) 121 (!) 119  Resp: 16 19 18 14   Temp: 98.6 F (37 C) 98.9 F (37.2 C) 98.3 F (36.8 C) 98.7 F (37.1 C)  TempSrc: Oral Oral Oral Axillary  SpO2: 100% 96% 94% 96%  Weight:      Height:        Intake/Output Summary (Last 24 hours) at 03/10/2018 1453 Last data filed at 03/10/2018 0514 Gross per 24 hour  Intake 144.97 ml  Output 350 ml  Net -205.03 ml   Filed Weights   02/08/2018 1121 02/13/2018 2300  Weight: 48.5 kg (107 lb) 47.6 kg (104 lb 15 oz)   Examination: Physical Exam:  Constitutional: Thin African-American female is currently resting comfortably and appears in no acute distress ENMT: External ears and nose appear normal Respiratory: Diminished to auscultation bilaterally with some  rhonchorous and coarse breath sounds. Cardiovascular: Tachycardic rate but regular rhythm.  Has 1-2+ lower extremity edema Abdomen: Bowel sounds present.  Appears soft GU: Deferred Neurologic: Patient is not responsive to verbal or physical stimuli now Psychiatric: Unable to assess 2/2 to current condition  Data Reviewed: I have personally reviewed following labs and imaging studies  CBC: Recent Labs  Lab 03/04/18 0512 03/09/18 0600  WBC 18.3* 0.2*  NEUTROABS 17.3*  --   HGB 7.6* 6.8*  HCT 23.4* 21.3*  MCV 79.1 80.1  PLT 283 27*   Basic Metabolic Panel: Recent Labs  Lab 03/04/18 0512 03/09/18 0440  NA 139 147*  K 3.3* 4.8  CL 94* 100  CO2 30 29  GLUCOSE 156* 69*  BUN 19 64*  CREATININE 1.19* 5.47*  CALCIUM 7.8* 8.3*  MG 1.9 2.6*  PHOS  --  6.6*   GFR: Estimated Creatinine Clearance: 8.3 mL/min (A) (by C-G formula based on SCr of 5.47 mg/dL (H)). Liver Function Tests: Recent Labs  Lab 03/04/18 0512 03/09/18 0440  AST 24 24  ALT 5 7  ALKPHOS 52 60  BILITOT 0.6 1.5*  PROT 5.0* 5.2*  ALBUMIN 2.4* 2.0*   No results  for input(s): LIPASE, AMYLASE in the last 168 hours. No results for input(s): AMMONIA in the last 168 hours. Coagulation Profile: No results for input(s): INR, PROTIME in the last 168 hours. Cardiac Enzymes: No results for input(s): CKTOTAL, CKMB, CKMBINDEX, TROPONINI in the last 168 hours. BNP (last 3 results) No results for input(s): PROBNP in the last 8760 hours. HbA1C: No results for input(s): HGBA1C in the last 72 hours. CBG: No results for input(s): GLUCAP in the last 168 hours. Lipid Profile: No results for input(s): CHOL, HDL, LDLCALC, TRIG, CHOLHDL, LDLDIRECT in the last 72 hours. Thyroid Function Tests: No results for input(s): TSH, T4TOTAL, FREET4, T3FREE, THYROIDAB in the last 72 hours. Anemia Panel: No results for input(s): VITAMINB12, FOLATE, FERRITIN, TIBC, IRON, RETICCTPCT in the last 72 hours. Sepsis Labs: Recent Labs  Lab  03/04/18 0512  PROCALCITON 0.52    No results found for this or any previous visit (from the past 240 hour(s)).  Radiology Studies: No results found.   Scheduled Meds: . glycopyrrolate  0.2 mg Intravenous BID   Continuous Infusions: . sodium chloride 10 mL/hr at 03/01/18 1055  . HYDROmorphone 2 mg/hr (03/10/18 0514)  . ondansetron (ZOFRAN) IV      LOS: 18 days   Kerney Elbe, DO [facility] Pager [phone]  If 7PM-7AM, please contact night-coverage [email or web address] Password [email or web address] 03/10/2018, 2:53 PM

## 2018-03-10 NOTE — Progress Notes (Signed)
Pt's husband, sisters, nephew and friends surrounded her bedside when I arrived. Her husband sat in a corner chair of the room and remained quiet during most of the visit. My request to the room was made by her sister who said she wanted prayer. Pt's husband was in agreement for prayer. The family was appropriately tearful during prayer. I remained for continued emotional support and pastoral presence. Please page if additional assistance is needed. [place], MDiv   03/10/18 0900  Clinical Encounter Type  Visited With Family

## 2018-03-11 DIAGNOSIS — D61818 Other pancytopenia: Secondary | ICD-10-CM

## 2018-03-11 MED ORDER — GLYCOPYRROLATE 0.2 MG/ML IJ SOLN
0.2000 mg | Freq: Four times a day (QID) | INTRAMUSCULAR | Status: DC
  Administered 2018-03-11 – 2018-03-13 (×10): 0.2 mg via INTRAVENOUS
  Filled 2018-03-11 (×8): qty 1

## 2018-03-11 MED ORDER — HYDROMORPHONE BOLUS VIA INFUSION
2.0000 mg | INTRAVENOUS | Status: DC | PRN
  Administered 2018-03-11: 2 mg via INTRAVENOUS

## 2018-03-11 MED ORDER — HYDROMORPHONE BOLUS VIA INFUSION
1.0000 mg | INTRAVENOUS | Status: DC | PRN
  Filled 2018-03-11: qty 1

## 2018-03-11 MED ORDER — ALTEPLASE 2 MG IJ SOLR
2.0000 mg | Freq: Once | INTRAMUSCULAR | Status: DC
  Filled 2018-03-11: qty 2

## 2018-03-11 NOTE — Progress Notes (Signed)
Noted transition to comfort care-anticipate hospital death.

## 2018-03-11 NOTE — Progress Notes (Signed)
PROGRESS NOTE    Anita Whitehead  DQQ:[id number] DOB: [birth year] DOA: 02/23/2018 PCP: [name], NP   Brief Narrative:  The patient is a 62 year old with past medical history relevant for remote history of cervical cancer status post radiation, hypertension, hyperlipidemia, known left-sided high-grade sarcoma who was admitted with partial small bowel obstruction to [facility] on 02/10/2018 and was noted to have mild AKI and bilateral left greater than right hydronephrosis.  She initially had mild improvement of her partial small bowel obstruction as she was not deemed a surgical candidate by general surgery and was tolerating clear liquids.  She was transferred to [facility] on 02/23/2018 and on 02/21/2018 received bilateral ureteral stents. She n.p.o. due to complete bowel obstruction and has an NG tube and is status post venting gastrostomy.  She was receiving chemotherapy in the hopes that it would relieve some of the obstruction and allow her to use her bowel however her condition is deteriorated dramatically with worsening hypoxia and she was transitioned to comfort care with family on board after discussion with palliative Care. Patient is a high risk for decompensation further and anticipate in-hospital death as death is imminent. [facility] as her Dilaudid drip requirements have been increased.    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[place]) Active Problems:   Essential hypertension   Cancer associated pain   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(HCC)   Left leg swelling   Dehydration   Nausea with vomiting   Partial small bowel obstruction (HCC)   Dehydration with hyponatremia   Asymptomatic bacteriuria   Mononeuropathy of left lower limb   Sarcoma (HCC)   Terminal care  Metastatic high-grade retroperitoneal sarcoma, complicated by malignant small bowel obstruction and now complicated by lethargy/altered mental  status/hypoxia -Currently patient is comfort care and resting comfortably with NRB -Discontinued non-comfort care treatment -Continue PRN Lorazepam IV 1 mg every 4 hours -Continue PRN Haloperidol IV 2 mg every 4 hours -Continue IV Glycopyrrolate 0.2 mg twice daily and q4hprn Gurgling and Secretions; Oncology increaqsing Robinul frequency given on a scheduled basis due to secretions -Continue Hydromorphone infusion 1 mg/h with 1 mg boluses q1hprn Pain and SOB; Drip Rate had to be increased overnight because restlessness and agitation  -C/w Prochlorperazine 10 mg IV q6hprn N/V and C/w Ondansetron 8 mg IV q8hprn Nausea  -Palliative care and Medical Oncology following  -Will not Draw anymore labs   Bilateral hydronephrosis/UTI/hypertension/tachycardia/anemia: Status post bilateral ureteral stents on 02/09/2018 -Comofort Measures   Anemia: Likely related to malignancy and iron deficiency. -Status post a dose of IV iron on 02/22/2018 and 02/26/2018 -Hb/Hct this AM was low at 6.8/21.3 -Will not check bloodwork again as patient is [place]   Hypertension/Tachycardia -Was Improved with IV Metoprolol but no longer on that so is Tachycardic again  -Patient was on carvedilol 12.5 mg twice daily at home but will not continue as she is Comfort Care -Discontinued IV metoprolol tartrate's 5 mg every 6 hours here given transition to [place]  DVT prophylaxis: None Comfort Care Code Status: DO NOT RESUSCITATE  Family Communication: Discussed with family at bedside  Disposition Plan: High risk for decompensation and expect In Hospital Death; Patient is deteriorating   Consultants:   Medical Oncology  Palliative Care Medicine   Urology  Interventional Radiology    Procedures:   02/06/2018 status post bilateral ureteral stents  Antimicrobials:  Anti-infectives (From admission, onward)   Start  Dose/Rate Route Frequency Ordered Stop   02/27/18 1000  ceFAZolin (ANCEF) IVPB 1 g/50 mL  premix  Status:  Discontinued     1 g 100 mL/hr over 30 Minutes Intravenous Every 8 hours 02/27/18 0820 03/04/18 0748   02/25/2018 1000  ceFAZolin (ANCEF) IVPB 1 g/50 mL premix  Status:  Discontinued     1 g 100 mL/hr over 30 Minutes Intravenous Every 12 hours 02/23/2018 0741 02/27/18 0820   02/06/2018 0942  ceFAZolin (ANCEF) 2-4 GM/100ML-% IVPB    Note to Pharmacy:  Chrystine Oiler: cabinet override      03/01/2018 0942 02/23/2018 1000   02/22/18 1800  sulfamethoxazole-trimethoprim (BACTRIM,SEPTRA) 400-80 MG per tablet 1 tablet  Status:  Discontinued     1 tablet Oral Every 12 hours 02/22/18 1620 02/11/2018 0741   02/10/2018 1445  cefTRIAXone (ROCEPHIN) 1 g in sodium chloride 0.9 % 100 mL IVPB     1 g 200 mL/hr over 30 Minutes Intravenous  Once 02/17/2018 1432 03/06/2018 1515     Subjective:  Seen and examined at bedside was minimally responsive and opened eyes once briefly. Had a lot of secretions and had to be suctioned early per sister.  Patient rested well last night.  No other concerns or complaints at this time and family appreciative of care.  Objective: Vitals:   03/08/18 2115 03/09/18 0452 03/10/18 0556 03/11/18 0500  BP: 126/76 114/84 122/80 98/75  Pulse: (!) 121 (!) 121 (!) 119 (!) 126  Resp: 19 18 14 18   Temp: 98.9 F (37.2 C) 98.3 F (36.8 C) 98.7 F (37.1 C) 99.1 F (37.3 C)  TempSrc: Oral Oral Axillary Axillary  SpO2: 96% 94% 96% 90%  Weight:      Height:        Intake/Output Summary (Last 24 hours) at 03/11/2018 1345 Last data filed at 03/11/2018 6767 Gross per 24 hour  Intake 101 ml  Output 450 ml  Net -349 ml   Filed Weights   03/03/2018 1121 02/22/2018 2300  Weight: 48.5 kg (107 lb) 47.6 kg (104 lb 15 oz)   Examination: Physical Exam:  Constitutional: Thin African-American female is currently resting but having more secretions and currently no acute distress ENMT: External ears and nose appear normal Respiratory: Diminished to auscultation bilaterally with  rhonchorous and coarse breath sounds Cardiovascular: Tachycardic rate but regular rhythm.  Has 1-2+ lower extremity edema Abdomen: Bowel sounds present. GU: Deferred Neurologic: Patient is minimally responsive and briefly open eyes to physical stimuli.  Does not respond to verbal stimuli Psychiatric: Unable to assess secondary to current condition  Data Reviewed: I have personally reviewed following labs and imaging studies  CBC: Recent Labs  Lab 03/09/18 0600  WBC 0.2*  HGB 6.8*  HCT 21.3*  MCV 80.1  PLT 27*   Basic Metabolic Panel: Recent Labs  Lab 03/09/18 0440  NA 147*  K 4.8  CL 100  CO2 29  GLUCOSE 69*  BUN 64*  CREATININE 5.47*  CALCIUM 8.3*  MG 2.6*  PHOS 6.6*   GFR: Estimated Creatinine Clearance: 8.3 mL/min (A) (by C-G formula based on SCr of 5.47 mg/dL (H)). Liver Function Tests: Recent Labs  Lab 03/09/18 0440  AST 24  ALT 7  ALKPHOS 60  BILITOT 1.5*  PROT 5.2*  ALBUMIN 2.0*   No results for input(s): LIPASE, AMYLASE in the last 168 hours. No results for input(s): AMMONIA in the last 168 hours. Coagulation Profile: No results for input(s): INR, PROTIME in the last 168 hours.  Cardiac Enzymes: No results for input(s): CKTOTAL, CKMB, CKMBINDEX, TROPONINI in the last 168 hours. BNP (last 3 results) No results for input(s): PROBNP in the last 8760 hours. HbA1C: No results for input(s): HGBA1C in the last 72 hours. CBG: No results for input(s): GLUCAP in the last 168 hours. Lipid Profile: No results for input(s): CHOL, HDL, LDLCALC, TRIG, CHOLHDL, LDLDIRECT in the last 72 hours. Thyroid Function Tests: No results for input(s): TSH, T4TOTAL, FREET4, T3FREE, THYROIDAB in the last 72 hours. Anemia Panel: No results for input(s): VITAMINB12, FOLATE, FERRITIN, TIBC, IRON, RETICCTPCT in the last 72 hours. Sepsis Labs: No results for input(s): PROCALCITON, LATICACIDVEN in the last 168 hours.  No results found for this or any previous visit (from the  past 240 hour(s)).  Radiology Studies: No results found.   Scheduled Meds: . alteplase  2 mg Intracatheter Once  . glycopyrrolate  0.2 mg Intravenous QID   Continuous Infusions: . sodium chloride 10 mL/hr at 03/01/18 1055  . HYDROmorphone 2 mg/hr (03/11/18 0706)  . ondansetron (ZOFRAN) IV      LOS: 19 days   Kerney Elbe, DO [facility] Pager [phone]  If 7PM-7AM, please contact night-coverage [email or web address] Password [email or web address] 03/11/2018, 1:45 PM

## 2018-03-11 NOTE — Progress Notes (Signed)
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 DNR, comfort measures only. Continue pain medicine as needed along with supportive care I will increase frequency of Robinul given on a scheduled basis due to secretions  Discharge planning I anticipate hospital death within the next few days.  [name], MD 03/11/2018  12:48 PM   Subjective:  Unresponsive. She is surrounded by family  Objective:  Vitals:   03/10/18 0556 03/11/18 0500  BP: 122/80 98/75  Pulse: (!) 119 (!) 126  Resp: 14 18  Temp: 98.7 F (37.1 C) 99.1 F (37.3 C)  SpO2: 96% 90%     Intake/Output Summary (Last 24 hours) at 03/11/2018 1248 Last data filed at 03/11/2018 3016 Gross per 24 hour  Intake 101 ml  Output 450 ml  Net -349 ml    GENERAL:sleepy, no distress and comfortable LUNGS: noted bilateral rhonchi   Labs:  Lab Results  Component Value Date   WBC 0.2 (LL) 03/09/2018   HGB 6.8 (LL) 03/09/2018   HCT 21.3 (L) 03/09/2018   MCV 80.1 03/09/2018   PLT 27 (LL) 03/09/2018   NEUTROABS 17.3 (H) 03/04/2018    Lab Results  Component Value Date   NA 147 (H) 03/09/2018   K 4.8 03/09/2018   CL 100 03/09/2018   CO2 29 03/09/2018

## 2018-03-12 NOTE — Progress Notes (Signed)
   03/12/18 1000  Clinical Encounter Type  Visited With Family  Visit Type Follow-up  Referral From Chaplain  Consult/Referral To Chaplain  Spiritual Encounters  Spiritual Needs Emotional;Other (Comment) (Spiritual Care Conversation/Support )  Stress Factors  Patient Stress Factors Not reviewed  Family Stress Factors Health changes;Major life changes   I visited with the patient and her family as a follow-up from previous encounters. The patient was not awake during my visit. Two family members were in the room. No needs were stated today. They are relying on their faith in God to get them through this difficult time.   Please, contact Spiritual Care for further assistance.  Chaplain Shanon Ace M.Div., [facility]

## 2018-03-12 NOTE — Progress Notes (Signed)
I saw the patient. The dose of Dilaudid is increased due to possible increased pain.  The patient appears to be intermittently moaning.  Oral secretions are minimum.  Continue comfort measures.

## 2018-03-12 NOTE — Progress Notes (Signed)
PROGRESS NOTE    Anita Whitehead  YIF:[id number] DOB: 03/23/1956 DOA: 02/18/2018 PCP: [name], NP   Brief Narrative:  The patient is a 62 year old with past medical history relevant for remote history of cervical cancer status post radiation, hypertension, hyperlipidemia, known left-sided high-grade sarcoma who was admitted with partial small bowel obstruction to [facility] on 03/02/2018 and was noted to have mild AKI and bilateral left greater than right hydronephrosis.  She initially had mild improvement of her partial small bowel obstruction as she was not deemed a surgical candidate by general surgery and was tolerating clear liquids.  She was transferred to [facility] on 02/23/2018 and on 03/03/2018 received bilateral ureteral stents. She n.p.o. due to complete bowel obstruction and has an NG tube and is status post venting gastrostomy.  She was receiving chemotherapy in the hopes that it would relieve some of the obstruction and allow her to use her bowel however her condition is deteriorated dramatically with worsening hypoxia and she was transitioned to comfort care with family on board after discussion with palliative Care. Patient is a high risk for decompensation further and anticipate in-hospital death as death is imminent. Will not [facility] to [facility] at [facility] time as her Dilaudid drip requirements have been increased and she appears to require more pain medication.    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[place]) Active Problems:   Essential hypertension   Cancer associated pain   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(HCC)   Left leg swelling   Dehydration   Nausea with vomiting   Partial small bowel obstruction (HCC)   Dehydration with hyponatremia   Asymptomatic bacteriuria   Mononeuropathy of left lower limb   Sarcoma (HCC)   Terminal care  Metastatic high-grade retroperitoneal sarcoma, complicated by malignant small bowel obstruction and now  complicated by lethargy/altered mental status/hypoxia -Currently patient is comfort care and resting comfortably with NRB -Discontinued non-comfort care treatment -Continue PRN Lorazepam IV 1 mg every 4 hours -Continue PRN Haloperidol IV 2 mg every 4 hours -Continue IV Glycopyrrolate 0.2 mg twice daily and q4hprn Gurgling and Secretions; Oncology increaqsing Robinul frequency given on a scheduled basis due to secretions -Continue Hydromorphone infusion 1 mg/h with 1 mg boluses q1hprn Pain and SOB; Drip Rate had to be increased overnight because restlessness and agitation  -C/w Prochlorperazine 10 mg IV q6hprn N/V and C/w Ondansetron 8 mg IV q8hprn Nausea  -Palliative care and Medical Oncology following  -Will not Draw anymore labs   Bilateral hydronephrosis/UTI/hypertension/tachycardia/anemia: Status post bilateral ureteral stents on 02/21/2018 -Comofort Measures   Anemia: Likely related to malignancy and iron deficiency. -Status post a dose of IV iron on 02/22/2018 and 02/26/2018 -Hb/Hct this AM was low at 6.8/21.3 -Will not check bloodwork again as patient is [place]   Hypertension/Tachycardia -Was Improved with IV Metoprolol but no longer on that so is Tachycardic again  -Patient was on carvedilol 12.5 mg twice daily at home but will not continue as she is Comfort Care -Discontinued IV metoprolol tartrate's 5 mg every 6 hours here given transition to [place]  DVT prophylaxis: None Comfort Care Code Status: DO NOT RESUSCITATE  Family Communication: Discussed with family at bedside  Disposition Plan: High risk for decompensation and expect In Hospital Death; Patient is deteriorating   Consultants:   Medical Oncology  Palliative Care Medicine   Urology  Interventional Radiology    Procedures:   02/17/2018 status post bilateral ureteral stents  Antimicrobials:  Anti-infectives (From  admission, onward)   Start     Dose/Rate Route Frequency Ordered Stop   02/27/18  1000  ceFAZolin (ANCEF) IVPB 1 g/50 mL premix  Status:  Discontinued     1 g 100 mL/hr over 30 Minutes Intravenous Every 8 hours 02/27/18 0820 03/04/18 0748   02/23/2018 1000  ceFAZolin (ANCEF) IVPB 1 g/50 mL premix  Status:  Discontinued     1 g 100 mL/hr over 30 Minutes Intravenous Every 12 hours 02/26/2018 0741 02/27/18 0820   03/05/2018 0942  ceFAZolin (ANCEF) 2-4 GM/100ML-% IVPB    Note to Pharmacy:  Chrystine Oiler: cabinet override      02/20/2018 0942 02/09/2018 1000   02/22/18 1800  sulfamethoxazole-trimethoprim (BACTRIM,SEPTRA) 400-80 MG per tablet 1 tablet  Status:  Discontinued     1 tablet Oral Every 12 hours 02/22/18 1620 02/23/2018 0741   02/21/2018 1445  cefTRIAXone (ROCEPHIN) 1 g in sodium chloride 0.9 % 100 mL IVPB     1 g 200 mL/hr over 30 Minutes Intravenous  Once 02/17/2018 1432 02/28/2018 1515     Subjective:  Seen and examined at bedside nursing reported she became agonal this AM with repositioning. When I went to examine her she does not respond to verbal or physical stimuli but is moaning slightly. Appears to be in No Acute distress now and sister states she has been resting and had a restful night after PRN dilaudid was used.   Objective: Vitals:   03/09/18 0452 03/10/18 0556 03/11/18 0500 03/12/18 0446  BP: 114/84 122/80 98/75 124/65  Pulse: (!) 121 (!) 119 (!) 126 (!) 135  Resp: 18 14 18  (!) 36  Temp: 98.3 F (36.8 C) 98.7 F (37.1 C) 99.1 F (37.3 C) 98.5 F (36.9 C)  TempSrc: Oral Axillary Axillary Oral  SpO2: 94% 96% 90% (!) 85%  Weight:      Height:        Intake/Output Summary (Last 24 hours) at 03/12/2018 1712 Last data filed at 03/12/2018 0539 Gross per 24 hour  Intake 92.12 ml  Output -  Net 92.12 ml   Filed Weights   02/27/2018 1121 03/02/2018 2300  Weight: 48.5 kg (107 lb) 47.6 kg (104 lb 15 oz)   Examination: Physical Exam:  Constitutional: Thin African-American female who is currently resting but intermittently moaning and not responsive to  physical verbal stimuli ENMT: External ears and nose appear normal Respiratory: Diminished to auscultation bilaterally with rhonchorous and coarse breath sounds with short and shallow breathing. Cardiovascular: Tachycardic rate and regular rhythm.  2-3+ lower extremity edema noted Abdomen: Bowel sounds present GU: Deferred Neurologic: Patient moaning intermittently and is not responsive to physical verbal stimuli. Psychiatric: Unable to assess due to current condition  Data Reviewed: I have personally reviewed following labs and imaging studies  CBC: Recent Labs  Lab 03/09/18 0600  WBC 0.2*  HGB 6.8*  HCT 21.3*  MCV 80.1  PLT 27*   Basic Metabolic Panel: Recent Labs  Lab 03/09/18 0440  NA 147*  K 4.8  CL 100  CO2 29  GLUCOSE 69*  BUN 64*  CREATININE 5.47*  CALCIUM 8.3*  MG 2.6*  PHOS 6.6*   GFR: Estimated Creatinine Clearance: 8.3 mL/min (A) (by C-G formula based on SCr of 5.47 mg/dL (H)). Liver Function Tests: Recent Labs  Lab 03/09/18 0440  AST 24  ALT 7  ALKPHOS 60  BILITOT 1.5*  PROT 5.2*  ALBUMIN 2.0*   No results for input(s): LIPASE, AMYLASE in the last 168 hours.  No results for input(s): AMMONIA in the last 168 hours. Coagulation Profile: No results for input(s): INR, PROTIME in the last 168 hours. Cardiac Enzymes: No results for input(s): CKTOTAL, CKMB, CKMBINDEX, TROPONINI in the last 168 hours. BNP (last 3 results) No results for input(s): PROBNP in the last 8760 hours. HbA1C: No results for input(s): HGBA1C in the last 72 hours. CBG: No results for input(s): GLUCAP in the last 168 hours. Lipid Profile: No results for input(s): CHOL, HDL, LDLCALC, TRIG, CHOLHDL, LDLDIRECT in the last 72 hours. Thyroid Function Tests: No results for input(s): TSH, T4TOTAL, FREET4, T3FREE, THYROIDAB in the last 72 hours. Anemia Panel: No results for input(s): VITAMINB12, FOLATE, FERRITIN, TIBC, IRON, RETICCTPCT in the last 72 hours. Sepsis Labs: No results  for input(s): PROCALCITON, LATICACIDVEN in the last 168 hours.  No results found for this or any previous visit (from the past 240 hour(s)).  Radiology Studies: No results found.   Scheduled Meds: . alteplase  2 mg Intracatheter Once  . glycopyrrolate  0.2 mg Intravenous QID   Continuous Infusions: . sodium chloride 10 mL/hr at 03/01/18 1055  . HYDROmorphone 2 mg/hr (03/12/18 1256)  . ondansetron (ZOFRAN) IV      LOS: 20 days   Kerney Elbe, DO [facility] Pager [phone]  If 7PM-7AM, please contact night-coverage [email or web address] Password [email or web address] 03/12/2018, 5:12 PM

## 2018-03-12 NOTE — Progress Notes (Signed)
Nurse in to reposition patient for comfort and patient becomes agonal with very little repositioning. Family requesting not to complete full turn at this time. Oral care provided. Will continue to monitor patient.

## 2018-03-12 NOTE — Progress Notes (Signed)
Pt's sister was alone w/pt when I arrived. She said she is spending the night. She said they did not have needs at this point. Please page if assistance is needed at a later time. [place], [place]   03/12/18 2000  Clinical Encounter Type  Visited With Family

## 2018-03-13 DIAGNOSIS — K5903 Drug induced constipation: Secondary | ICD-10-CM

## 2018-03-13 DIAGNOSIS — D500 Iron deficiency anemia secondary to blood loss (chronic): Secondary | ICD-10-CM

## 2018-03-13 NOTE — Progress Notes (Signed)
PROGRESS NOTE    Anita Whitehead   WUX:[id number]  DOB: July 25, 1956  DOA: 02/15/2018 PCP: [name], NP   Brief Narrative:  Anita Whitehead is a 62 year old with past medical history relevant for remote history of cervical cancer status post radiation, hypertension, hyperlipidemia, known left-sided high-grade sarcoma who was admitted with partial small bowel obstruction to [facility] on 02/22/2018 and was noted to have mild AKI and bilateral left greater than right hydronephrosis. She was transferred to [facility] on 02/23/2018 and on 02/28/2018 received bilateral ureteral stents and also received a venting PEG tube.  She was receiving chemotherapy in the hopes that it would relieve some of the obstruction and allow her to use her bowel however her condition is deteriorated dramatically with worsening hypoxia and she was transitioned to comfort care with family on board after discussion with palliative Care.    In-hospital death as death is expected and she is on a Dilaudid infusion.     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  Code Status: DNR Family Communication: sister at bedside Disposition Plan: cont comfort care Consultants:   Holden Beach   Urology  Interventional Radiology   Procedures:   02/09/2018 status post bilateral ureteral stents  02/26/18 IR PICC  7/25/ 2019 IR Gastrostomy   Antimicrobials:  Anti-infectives (From admission, onward)   Start     Dose/Rate Route Frequency Ordered Stop   02/27/18 1000  ceFAZolin (ANCEF) IVPB 1  g/50 mL premix  Status:  Discontinued     1 g 100 mL/hr over 30 Minutes Intravenous Every 8 hours 02/27/18 0820 03/04/18 0748   02/21/2018 1000  ceFAZolin (ANCEF) IVPB 1 g/50 mL premix  Status:  Discontinued     1 g 100 mL/hr over 30 Minutes Intravenous Every 12 hours 03/06/2018 0741 02/27/18 0820   02/06/2018 0942  ceFAZolin (ANCEF) 2-4 GM/100ML-% IVPB    Note to Pharmacy:  Chrystine Oiler: cabinet override      02/23/2018 0942 02/21/2018 1000   02/22/18 1800  sulfamethoxazole-trimethoprim (BACTRIM,SEPTRA) 400-80 MG per tablet 1 tablet  Status:  Discontinued     1 tablet Oral Every 12 hours 02/22/18 1620 03/06/2018 0741   02/12/2018 1445  cefTRIAXone (ROCEPHIN) 1 g in sodium chloride 0.9 % 100 mL IVPB     1 g 200 mL/hr over 30 Minutes Intravenous  Once 02/05/2018 1432 02/08/2018 1515       Objective: Vitals:   03/10/18 0556 03/11/18 0500 03/12/18 0446 03/13/18 0610  BP: 122/80 98/75 124/65 106/67  Pulse: (!) 119 (!) 126 (!) 135 (!) 123  Resp: 14 18 (!) 36 20  Temp: 98.7 F (37.1 C) 99.1 F (37.3 C) 98.5 F (36.9 C) 99.5 F (37.5 C)  TempSrc: Axillary Axillary Oral Oral  SpO2: 96% 90% (!) 85% (!) 87%  Weight:      Height:        Intake/Output Summary (Last 24 hours) at 03/13/2018 1249 Last data filed at 03/13/2018 0612 Gross per 24 hour  Intake 98.37 ml  Output 1450 ml  Net -1351.63 ml  Filed Weights   02/12/2018 1121 02/07/2018 2300  Weight: 48.5 kg (107 lb) 47.6 kg (104 lb 15 oz)    Examination: General exam: Appears comfortable  Cardiovascular system: S1 & S2 heard, RRR.     Data Reviewed: I have personally reviewed following labs and imaging studies  CBC: Recent Labs  Lab 03/09/18 0600  WBC 0.2*  HGB 6.8*  HCT 21.3*  MCV 80.1  PLT 27*   Basic Metabolic Panel: Recent Labs  Lab 03/09/18 0440  NA 147*  K 4.8  CL 100  CO2 29  GLUCOSE 69*  BUN 64*  CREATININE 5.47*  CALCIUM 8.3*  MG 2.6*  PHOS 6.6*   GFR: Estimated Creatinine Clearance: 8.3 mL/min (A) (by C-G  formula based on SCr of 5.47 mg/dL (H)). Liver Function Tests: Recent Labs  Lab 03/09/18 0440  AST 24  ALT 7  ALKPHOS 60  BILITOT 1.5*  PROT 5.2*  ALBUMIN 2.0*   No results for input(s): LIPASE, AMYLASE in the last 168 hours. No results for input(s): AMMONIA in the last 168 hours. Coagulation Profile: No results for input(s): INR, PROTIME in the last 168 hours. Cardiac Enzymes: No results for input(s): CKTOTAL, CKMB, CKMBINDEX, TROPONINI in the last 168 hours. BNP (last 3 results) No results for input(s): PROBNP in the last 8760 hours. HbA1C: No results for input(s): HGBA1C in the last 72 hours. CBG: No results for input(s): GLUCAP in the last 168 hours. Lipid Profile: No results for input(s): CHOL, HDL, LDLCALC, TRIG, CHOLHDL, LDLDIRECT in the last 72 hours. Thyroid Function Tests: No results for input(s): TSH, T4TOTAL, FREET4, T3FREE, THYROIDAB in the last 72 hours. Anemia Panel: No results for input(s): VITAMINB12, FOLATE, FERRITIN, TIBC, IRON, RETICCTPCT in the last 72 hours. Urine analysis:    Component Value Date/Time   COLORURINE YELLOW 02/26/2018 1335   APPEARANCEUR HAZY (A) 02/26/2018 1335   LABSPEC 1.013 02/26/2018 1335   PHURINE 5.0 02/26/2018 1335   GLUCOSEU NEGATIVE 02/26/2018 1335   GLUCOSEU NEGATIVE 12/12/2010 0814   HGBUR LARGE (A) 02/26/2018 1335   HGBUR negative 11/30/2008 0906   BILIRUBINUR NEGATIVE 02/26/2018 1335   BILIRUBINUR 1+ 06/09/2016 1122   KETONESUR NEGATIVE 02/26/2018 1335   PROTEINUR 30 (A) 02/26/2018 1335   UROBILINOGEN 1.0 06/09/2016 1122   UROBILINOGEN 0.2 12/12/2010 0814   NITRITE NEGATIVE 02/26/2018 1335   LEUKOCYTESUR MODERATE (A) 02/26/2018 1335   Sepsis Labs: @LABRCNTIP (procalcitonin:4,lacticidven:4) )No results found for this or any previous visit (from the past 240 hour(s)).       Radiology Studies: No results found.    Scheduled Meds: . alteplase  2 mg Intracatheter Once  . glycopyrrolate  0.2 mg Intravenous  QID   Continuous Infusions: . sodium chloride 20 mL/hr at 03/13/18 1146  . HYDROmorphone 2 mg/hr (03/13/18 0546)  . ondansetron (ZOFRAN) IV       LOS: 21 days    Time spent in minutes: 25    Debbe Odea, MD [facility] Pager: [email or web address] Password [facility] 03/13/2018, 12:49 PM

## 2018-03-13 NOTE — Progress Notes (Signed)
I saw the patient this morning.  She is surrounded by family members.  Continue comfort measures

## 2018-03-13 NOTE — Progress Notes (Signed)
Noted-comfort care-anticipate hospital death. Not appropriate for (General Inpt Care)GIP.

## 2018-03-18 NOTE — Progress Notes (Signed)
FMLA for sister, Bebe Liter, successfully faxed to [employer] at [phone]. Also successfully faxed a copy to [employer] at [phone].

## 2018-04-07 NOTE — Progress Notes (Signed)
Changed Dilaudid drip bag, wasted 5 mL with Hulan Fess, RN.

## 2018-04-07 NOTE — Progress Notes (Addendum)
Pt expired at 2307. Verified by Probation officer and Larene Beach, RN. Sister was present and at bedside, PICC line removed. 45 mL (0.5mg /ml) of Dilaudid wasted with Larene Beach, RN.

## 2018-04-07 NOTE — Progress Notes (Signed)
Patient Demographics:    Anita Whitehead, is a 62 y.o. female, DOB - 1956-07-14, DDU:[id number]  Admit date - 03/04/2018   Admitting Physician No admitting provider for patient encounter.  Outpatient Primary MD for the patient is [name], NP  LOS - 53   Chief Complaint  Patient presents with  . 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[place]) Active Problems:   Essential hypertension   Cancer associated pain   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(HCC)   Dehydration   Nausea with vomiting   Partial small bowel obstruction (HCC)   Dehydration with hyponatremia   Asymptomatic bacteriuria   Mononeuropathy of left lower limb   Terminal care  Brief Narrative:  Anita Whitehead is a 62 year old with past medical history relevant for remote history of cervical cancer status post radiation, hypertension, hyperlipidemia, known left-sided high-grade sarcoma who was admitted with partial small bowel obstruction to [facility] on 02/12/2018 and was noted to have mild AKI and bilateral left greater than right hydronephrosis. She was transferred to [facility] on 02/23/2018 and on 03/03/2018 received bilateral ureteral stents and also received a venting PEG tube.  She was receiving chemotherapy in the hopes that it would relieve some of the obstruction and allow her to use her bowel however her condition is deteriorated dramatically with worsening hypoxia and she was transitioned to comfort care with family on board after discussion with palliative Care.    In-hospital death as death is expected and she is on a Dilaudid infusion   Plan: 1)Retroperitoneal sarcoma obstruction-- continue comfort care and IV Dilaudid drip, patient is actively  dying,  2)Dehydration and renal failure- continue comfort care and IV Dilaudid drip, patient is actively dying,   Code Status: DNR Family Communication:   Niece at bedside Disposition Plan: continue comfort care and IV Dilaudid drip, patient is actively dying, Consultants:   Oncology  Palliative Care Medicine   Urology  Interventional Radiology   Procedures:   02/05/2018 status post bilateral ureteral stents  02/26/18 IR PICC  7/25/ 2019 IR Gastrostomy  Lab Results  Component Value Date   PLT 27 (LL) 03/09/2018    Inpatient Medications  Scheduled Meds: . alteplase  2 mg Intracatheter Once  . glycopyrrolate  0.2 mg Intravenous QID   Continuous Infusions: . sodium chloride 20 mL/hr at 03/13/18 1146  . HYDROmorphone 2 mg/hr (04-03-18 0720)  . ondansetron (ZOFRAN) IV     PRN Meds:.Cold Pack, glycopyrrolate, haloperidol lactate, heparin lock flush, heparin lock flush, HYDROmorphone, iopamidol, LORazepam, [DISCONTINUED] ondansetron **OR** ondansetron (ZOFRAN) IV, prochlorperazine, sodium chloride flush, sodium chloride flush    Anti-infectives (From admission, onward)   Start     Dose/Rate Route Frequency Ordered Stop   02/27/18 1000  ceFAZolin (ANCEF) IVPB 1 g/50 mL premix  Status:  Discontinued     1 g 100 mL/hr over 30 Minutes Intravenous Every 8 hours 02/27/18 0820 03/04/18 0748   02/28/2018 1000  ceFAZolin (ANCEF) IVPB 1 g/50 mL premix  Status:  Discontinued  1 g 100 mL/hr over 30 Minutes Intravenous Every 12 hours 02/11/2018 0741 02/27/18 0820   02/08/2018 0942  ceFAZolin (ANCEF) 2-4 GM/100ML-% IVPB    Note to Pharmacy:  Chrystine Oiler: cabinet override      03/01/2018 0942 02/17/2018 1000   02/22/18 1800  sulfamethoxazole-trimethoprim (BACTRIM,SEPTRA) 400-80 MG per tablet 1 tablet  Status:  Discontinued     1 tablet Oral Every 12 hours 02/22/18 1620 02/19/2018 0741   02/25/2018 1445  cefTRIAXone (ROCEPHIN) 1 g in sodium chloride 0.9 % 100 mL IVPB     1 g 200  mL/hr over 30 Minutes Intravenous  Once 03/01/2018 1432 03/06/2018 1515        Objective:   Vitals:   03/11/18 0500 03/12/18 0446 03/13/18 0610 20-Mar-2018 0620  BP: 98/75 124/65 106/67 109/62  Pulse: (!) 126 (!) 135 (!) 123 (!) 122  Resp: 18 (!) 36 20 16  Temp:  98.5 F (36.9 C) 99.5 F (37.5 C) 99.5 F (37.5 C)  TempSrc: Axillary Oral Oral Oral  SpO2: 90% (!) 85% (!) 87% 92%  Weight:      Height:        Wt Readings from Last 3 Encounters:  02/13/2018 47.6 kg  01/04/18 48.5 kg  12/21/17 47 kg     Intake/Output Summary (Last 24 hours) at 2018-03-20 1722 Last data filed at 2018-03-20 0704 Gross per 24 hour  Intake 841.93 ml  Output 600 ml  Net 241.93 ml    Physical Exam  Gen:-Unresponsive, appears comfortable,  Lungs-  CTAB , fair air movement CV- S1, S2 normal Neuropsych-patient appears comfortable, responsive,   Data Review:   Micro Results No results found for this or any previous visit (from the past 240 hour(s)).  Radiology Reports Dg Abd 1 View  Result Date: 02/27/2018 CLINICAL DATA:  Follow-up ileus. Current history of large sarcoma involving the LEFT side of the abdomen and pelvis extending into the proximal LEFT thigh. EXAM: ABDOMEN - 1 VIEW COMPARISON:  KUB 02/26/2018, 02/25/2018. CT abdomen and pelvis 02/16/2018. FINDINGS: Persistent moderately distended loops of small bowel, unchanged, displaced into the mid abdomen and RIGHT UPPER QUADRANT due to the large sarcoma as identified on the recent CT. Contrast material within the colon, though the distribution of the contrast has not significantly changed over the past 2 days, indicating adynamic ileus. BILATERAL double-J ureteral stents as noted previously, unchanged in position. Nasogastric tube with its tip in the body of the stomach. IMPRESSION: 1. Stable ileus. 2. Ileus is confirmed by the fact that the contrast material in the colon has not significantly progressed over the past 2 days, indicating an adynamic state.  3. Nasogastric tube tip in the body of the stomach. Electronically Signed   By: Evangeline Dakin M.D.   On: 02/27/2018 12:37   Dg Abd 1 View  Result Date: 02/25/2018 CLINICAL DATA:  Generalized abdominal pain and distension this morning. History of small-bowel obstruction. EXAM: ABDOMEN - 1 VIEW COMPARISON:  KUB of February 21, 2018 FINDINGS: There remain loops of moderately distended gas-filled small bowel which are more conspicuous today. There is contrast within normal caliber colon. There are bilateral ureteral stents. There surgical clips in the left aspect of the pelvis. IMPRESSION: Findings consistent with distal small bowel obstruction. The degree of bowel distention has increased to a mild degree. Moderate amount of contrast laden stool within the colon. Electronically Signed   By: David  Martinique M.D.   On: 02/25/2018 07:53   Dg Abd 1 View  Result Date: 02/21/2018 CLINICAL DATA:  Abdominal pain and distention for 2 days EXAM: ABDOMEN - 1 VIEW COMPARISON:  CT abdomen pelvis of 02/04/2018 FINDINGS: FINDINGS Prominent soft tissue throughout the left flank appears to be due to the CT demonstrated retroperitoneal tumor apparently sarcoma, much of the bowel is pushed to the right abdomen. No bowel obstruction is seen. Some contrast is noted within distal small bowel loops without distension. Surgical clips are noted overlying the pelvis. Sclerotic lesions are present throughout the bones of the pelvis consistent with metastasis. IMPRESSION: 1. Paucity of bowel gas throughout the left flank most consistent with the CT demonstrated retroperitoneal soft tissue mass most consistent with sarcoma at by history. 2. No bowel obstruction. Some contrast node is noted within the small bowel distally. 3. Blastic pelvic bone lesions suggestive of metastasis. Electronically Signed   By: Ivar Drape M.D.   On: 02/21/2018 10:57   Ct Head Wo Contrast  Result Date: 03/03/2018 CLINICAL DATA:  Altered mental status EXAM: CT  HEAD WITHOUT CONTRAST TECHNIQUE: Contiguous axial images were obtained from the base of the skull through the vertex without intravenous contrast. COMPARISON:  None. FINDINGS: Brain: Mild atrophic changes are identified. No findings to suggest acute hemorrhage, acute infarction or space-occupying mass lesion are noted. Vascular: No hyperdense vessel or unexpected calcification. Skull: Normal. Negative for fracture or focal lesion. Sinuses/Orbits: No acute finding. Other: None. IMPRESSION: Atrophic changes without acute abnormality. Electronically Signed   By: Inez Catalina M.D.   On: 03/03/2018 08:39   Ct Chest W Contrast  Result Date: 02/22/2018 CLINICAL DATA:  Retroperitoneal soft tissue sarcoma, for staging EXAM: CT CHEST WITH CONTRAST TECHNIQUE: Multidetector CT imaging of the chest was performed during intravenous contrast administration. CONTRAST:  33mL OMNIPAQUE IOHEXOL 300 MG/ML  SOLN COMPARISON:  Partial comparison to CT abdomen/pelvis dated 02/19/2018 FINDINGS: Cardiovascular: Heart is normal in size.  No pericardial effusion. No evidence of thoracic aortic aneurysm. Mild atherosclerotic calcifications of the aortic arch. Coronary atherosclerosis of the LAD. Mediastinum/Nodes: No suspicious mediastinal, hilar, or axillary lymphadenopathy. Visualized thyroid is unremarkable. Lungs/Pleura: Mild linear scarring/atelectasis in the right upper lobe (series 4/image 45). Mild linear/branching scarring/atelectasis in the left upper lobe (series 4/image 61). No suspicious pulmonary nodules. No focal consolidation. No pleural effusion or pneumothorax. Upper Abdomen: Better evaluated on recent CT abdomen/pelvis, noting a dominant left retroperitoneal sarcoma and left hydronephrosis. Musculoskeletal: Visualized osseous structures are within normal limits. IMPRESSION: No evidence of metastatic disease in the chest. Left retroperitoneal sarcoma with left hydronephrosis, incompletely visualized and better evaluated on  recent CT abdomen/pelvis. Aortic Atherosclerosis (ICD10-I70.0). Electronically Signed   By: Julian Hy M.D.   On: 02/22/2018 10:38   Ct Abdomen Pelvis W Contrast  Result Date: 02/25/2018 CLINICAL DATA:  62 year old female with a history of bloated sensation with diarrhea. Known left pelvis sarcoma, with the electronic records indicating treatment at [facility]. Biopsy was performed previously 06/01/2017. EXAM: CT ABDOMEN AND PELVIS WITH CONTRAST TECHNIQUE: Multidetector CT imaging of the abdomen and pelvis was performed using the standard protocol following bolus administration of intravenous contrast. CONTRAST:  164mL OMNIPAQUE IOHEXOL 300 MG/ML  SOLN COMPARISON:  MR 05/31/2017, CT 06/02/2015 FINDINGS: Lower chest: Lower chest unremarkable. No pleural effusion or nodules of the lung bases. Unremarkable visualized cardiac structures. Unremarkable chest wall. Hepatobiliary: Unremarkable appearance of liver parenchyma. Unremarkable gallbladder. Pancreas: Unremarkable pancreas. Spleen: Unremarkable spleen Adrenals/Urinary Tract: Unremarkable adrenal glands. Right kidney demonstrates moderate hydronephrosis and dilation of the proximal right ureter. The right ureter is inseparable  from soft tissue in the right anatomic pelvis, as the ureter crosses the iliac vasculature. Left kidney with moderate to large hydronephrosis. The left ureter is visualized proximally to be circumferentially involved with retroperitoneal tumor. There is loss of visualization of the left ureter distally. Tumor tissue involves the inferior aspect of the left kidney and the lateral aspect of the left kidney, with tissue extending superiorly to the inferior margin of the spleen. Stomach/Bowel: Unremarkable stomach. Tumor tissues inseparable from the posterior aspect of the stomach. Small bowel loops have been displaced towards the patient's right given the volume of tumor in the left retroperitoneum. Tissue of the rectum is inseparable from  tumor tissue in the recto uterine space. The rectum/sigmoid rectal junction is displaced to the patient's right secondary to tumor tissue. The descending colon is up lifted and displaced, with the margins of sigmoid colon inseparable from tumor tissue in the abdomen/pelvis. The posterior aspects of the cecum is intimately associated with tumor tissue in the right anatomic pelvis. Unremarkable appendix. Proximal colon is somewhat distended into the transverse colon. The splenic flexure is not well visualized. Enteric contrast through the length of small bowel, with mild dilation of small bowel loops. Small bowel loops within the right low abdomen are inseparable from tumor tissue at the level of the sacrum/sacral base. Vascular/Lymphatic: Atherosclerotic changes of the abdominal aorta. The left iliac system is circumferential involved with tumor tissue including both the distal common iliac artery, hypogastric artery, and external iliac artery which is uplifted through its course in the pelvis. The common femoral artery is circumferential involved with tumor in the proximal thigh. The left iliac vein is not visualized. Tumor tissue encroaches on the distal aorta just above the bifurcation, at the level of the inferior mesenteric artery origin. Mesenteric arteries of the low abdomen are displaced secondary to tumor tissue at the sacral base. Soft tissue implants overlying the right-sided rectus femora S measures 15 mm. Tumor tissue is inseparable from the left-sided abdominal musculature below the umbilicus, with tumor tissue at the umbilicus (sister Wynona Dove node). Reproductive: Hysterectomy. Tumor tissue occupies the anatomic pelvis, inseparable from the posterior urinary bladder and the rectum within the recto uterine space. Other: Interval enlargement of tumor within the left retroperitoneum/extra peritoneum, with tumor now extending from the left abdominal musculature medially to the lumbar spine and aorta, up  lifting the bowel and sigmoid colon. Tumor extends superior along the lateral kidney within the perinephric space and extra peritoneum to the spleen. Tumor extends inferiorly within the gluteal musculature and the anatomic pelvis into the proximal thigh. Musculoskeletal: Irregularity of the left iliac bone with rarefaction of the bone adjacent to the tumor. Osteopenia throughout the musculoskeletal system. No acute fracture identified. IMPRESSION: CT demonstrates significant progression of known left pelvic sarcoma. The tumor demonstrates predominantly retroperitoneal and extraperitoneal growth, occupying the left retroperitoneum, with extension superiorly to the inferior spleen, lateral to the kidney, medially to the infrarenal aorta and lumbar spine, inferior medially into the anatomic pelvis, and inferior laterally into the proximal left thigh. Sequela include: -left moderate to large hydronephrosis -mild to moderate right hydronephrosis -compression of distal small bowel loops and rectosigmoid junction, resulting in at least partial small bowel obstruction/colonic obstruction -circumferential involvement of the left iliac arteries including common, internal iliac, external iliac, and common femoral artery -likely obstruction of the left iliac venous system -extraperitoneal spread along the bilateral abdominal wall musculature, including lymphatic involvement at the umbilicus (sister Wynona Dove node). These results were called  by telephone at the time of interpretation on 02/22/2018 at 5:25 pm to Dr. Maree Erie [facility] , who verbally acknowledged these results. Electronically Signed   By: Corrie Mckusick D.O.   On: 02/19/2018 17:25   Ir Gastrostomy Tube Mod Sed  Result Date: 02/28/2018 INDICATION: History of recurrent retroperitoneal sarcoma now with enteric obstruction. Request made for placement gastrostomy tube for gastric venting purposes. EXAM: PULL TROUGH GASTROSTOMY TUBE PLACEMENT COMPARISON:  CT abdomen and  pelvis-03/02/2018 MEDICATIONS: The patient is currently admitted to the hospital receiving intravenous antibiotics; Antibiotics were administered within 1 hour of the procedure. Glucagon 1 mg IV CONTRAST:  20 cc Isovue-300 administered into the gastric lumen. ANESTHESIA/SEDATION: Moderate (conscious) sedation was employed during this procedure. A total of Versed 3 mg and Dilaudid 2 mg was administered intravenously. Moderate Sedation Time: 16 minutes. The patient's level of consciousness and vital signs were monitored continuously by radiology nursing throughout the procedure under my direct supervision. FLUOROSCOPY TIME:  4 minutes 42 seconds (63 mGy) COMPLICATIONS: None immediate. PROCEDURE: Informed written consent was obtained from the patient following explanation of the procedure, risks, benefits and alternatives. A time out was performed prior to the initiation of the procedure. Ultrasound scanning was performed to demarcate the edge of the left lobe of the liver. Maximal barrier sterile technique utilized including caps, mask, sterile gowns, sterile gloves, large sterile drape, hand hygiene and Betadine prep. The left upper quadrant was sterilely prepped and draped. An oral gastric catheter was inserted into the stomach under fluoroscopy. The existing nasogastric feeding tube was removed. The left costal margin and air opacified transverse colon were identified and avoided. Air was injected into the stomach for insufflation and visualization under fluoroscopy. Under sterile conditions a 17 gauge trocar needle was utilized to access the stomach percutaneously beneath the left subcostal margin after the overlying soft tissues were anesthetized with 1% Lidocaine with epinephrine. Needle position was confirmed within the stomach with aspiration of air and injection of small amount of contrast. A single T tack was deployed for gastropexy. Over an Amplatz guide wire, a 9-French sheath was inserted into the  stomach. A snare device was utilized to capture the oral gastric catheter. The snare device was pulled retrograde from the stomach up the esophagus and out the oropharynx. The 20-French pull-through gastrostomy was connected to the snare device and pulled antegrade through the oropharynx down the esophagus into the stomach and then through the percutaneous tract external to the patient. The gastrostomy was assembled externally. Contrast injection confirms position in the stomach. Several spot radiographic images were obtained in various obliquities for documentation. The patient tolerated procedure well without immediate post procedural complication. FINDINGS: After successful fluoroscopic guided placement, the gastrostomy tube is appropriately positioned with internal disc against the ventral aspect of the gastric lumen. IMPRESSION: Successful fluoroscopic insertion of a 20-French pull-through gastrostomy tube. The gastrostomy may be used immediately for venting and/or medication administration and in 24 hrs for the initiation of feeds. Electronically Signed   By: Sandi Mariscal M.D.   On: 02/28/2018 16:18   Dg Abd 2 Views  Result Date: 03/03/2018 CLINICAL DATA:  Abdominal pain and swelling EXAM: ABDOMEN - 2 VIEW COMPARISON:  Four days ago FINDINGS: Percutaneous gastrostomy tube with T tack in place. Oral contrast is still seen within the colon. Elsewhere there is gaseous distension of bowel. Bilateral internal ureteral stents. Bowel is preferentially on the right due to large pelvic and left abdominal mass by CT. IMPRESSION: Continued ileus findings with contrast  remaining within the colon. There may be superimposed partial obstruction due to the patient's bulky left abdominal and pelvic mass. Electronically Signed   By: Monte Fantasia M.D.   On: 03/03/2018 13:13   Dg Abd Portable 1v  Result Date: 02/26/2018 CLINICAL DATA:  NG tube placement. EXAM: PORTABLE ABDOMEN - 1 VIEW COMPARISON:  02/25/2018. FINDINGS:  NG tube noted in the stomach. No gastric distention. Bilateral ureteral stents noted. Distended loops of small large bowel noted suggesting adynamic ileus. Oral contrast in the colon. IMPRESSION: 1. NG tube noted coiled in the stomach. Bilateral double-J ureteral stents noted in good anatomic position. 2. Distended loops of small large bowel consistent adynamic ileus. Contrast noted in the colon. Electronically Signed   By: Marcello Moores  Register   On: 02/26/2018 12:34   Dg C-arm 1-60 Min-no Report  Result Date: 03/02/2018 Fluoroscopy was utilized by the requesting physician.  No radiographic interpretation.   Ir Picc Placement Right >5 Yrs Inc Img Guide  Result Date: 02/27/2018 INDICATION: Poor venous access.  Request PICC line placement for IV access. EXAM: RIGHT UPPER EXTREMITY PICC LINE PLACEMENT WITH ULTRASOUND AND FLUOROSCOPIC GUIDANCE MEDICATIONS: None; ANESTHESIA/SEDATION: Moderate Sedation Time:  None The patient was continuously monitored during the procedure by the interventional radiology nurse under my direct supervision. FLUOROSCOPY TIME:  Fluoroscopy Time: 18 seconds COMPLICATIONS: None immediate. PROCEDURE: The patient was advised of the possible risks and complications and agreed to undergo the procedure. The patient was then brought to the angiographic suite for the procedure. The right arm was prepped with chlorhexidine, draped in the usual sterile fashion using maximum barrier technique (cap and mask, sterile gown, sterile gloves, large sterile sheet, hand hygiene and cutaneous antiseptic). Local anesthesia was attained by infiltration with 1% lidocaine. Ultrasound demonstrated patency of the brachial vein, and this was documented with an image. Under real-time ultrasound guidance, this vein was accessed with a 21 gauge micropuncture needle and image documentation was performed. The needle was exchanged over a guidewire for a peel-away sheath through which a 36 cm 5 [place] dual lumen power  injectable PICC was advanced, and positioned with its tip at the lower SVC/right atrial junction. Fluoroscopy during the procedure and fluoro spot radiograph confirms appropriate catheter position. The catheter was flushed, secured to the skin, and covered with a sterile dressing. IMPRESSION: Successful placement of a right arm PICC with sonographic and fluoroscopic guidance. The catheter is ready for use. Read by: Ascencion Dike PA-C Electronically Signed   By: Jerilynn Mages.  Shick M.D.   On: 02/26/2018 17:32   [place] Ekg Site Rite  Result Date: 02/26/2018 If Site Rite image not attached, placement could not be confirmed due to current cardiac rhythm.    CBC Recent Labs  Lab 03/09/18 0600  WBC 0.2*  HGB 6.8*  HCT 21.3*  PLT 27*  MCV 80.1  MCH 25.6*  MCHC 31.9  RDW 21.3*    Chemistries  Recent Labs  Lab 03/09/18 0440  NA 147*  K 4.8  CL 100  CO2 29  GLUCOSE 69*  BUN 64*  CREATININE 5.47*  CALCIUM 8.3*  MG 2.6*  AST 24  ALT 7  ALKPHOS 60  BILITOT 1.5*   ------------------------------------------------------------------------------------------------------------------ No results for input(s): CHOL, HDL, LDLCALC, TRIG, CHOLHDL, LDLDIRECT in the last 72 hours.  No results found for: HGBA1C ------------------------------------------------------------------------------------------------------------------ No results for input(s): TSH, T4TOTAL, T3FREE, THYROIDAB in the last 72 hours.  Invalid input(s): FREET3 ------------------------------------------------------------------------------------------------------------------ No results for input(s): VITAMINB12, FOLATE, FERRITIN, TIBC, IRON, RETICCTPCT in the  last 72 hours.  Coagulation profile No results for input(s): INR, PROTIME in the last 168 hours.  No results for input(s): DDIMER in the last 72 hours.  Cardiac Enzymes No results for input(s): CKMB, TROPONINI, MYOGLOBIN in the last 168 hours.  Invalid input(s):  CK ------------------------------------------------------------------------------------------------------------------ No results found for: BNP   Roxan Hockey M.D on 11-Apr-2018 at 5:22 PM   Go to [email or web address] - password [email or web address] for contact info  [facility] - Office  [phone]

## 2018-04-07 NOTE — Progress Notes (Signed)
Pt expired at 2307 pronounced by 2 RNs. Pt was a DNR and comfort care only. Death certificate completed and given to NT at desk.  KJKG, NP [facility]

## 2018-04-07 NOTE — Progress Notes (Signed)
I saw the patient. She appears comfortable.  Continue comfort measures

## 2018-04-07 NOTE — Discharge Summary (Signed)
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) Active Problems:   Essential hypertension   Cancer associated pain   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(HCC)   Dehydration   Nausea with vomiting   Partial small bowel obstruction (HCC)   Dehydration with hyponatremia   Asymptomatic bacteriuria   Mononeuropathy of left lower limb   Terminal care   History of Present Illness:  Anita Whitehead is a 62 y.o. female presenting with abdominal bloating and pain x 1 week.  She has a known left sided retroperitoneal sarcoma and is followed by Dr. Alvy Bimler as her primary oncologist. [facility] gave a second opinion, and [facility] with Dr. Octavia Heir who was a specialist in sarcomas. She was seen by her PCP yesterday for this complaint.  Abdominal X-ray was suggestive of possible small bowel obstruction and patient was told to come to ED.  She is reporting some abdominal pain that has been worsening over the past few days.   She has had tightness and bloating since last Monday. Reports some vomiting last week when she was bloated and had a feeling of fullness. She also reports some diarrhea, but no bowel movement since Friday. Patient reports that she does not eat a lot and has been a slow eater, and it only worsened after her diagnosis last October.   Prior to this week, patient reports that she has been feeling good. She is doing physical therapy 3 times a week and recently started a part time job. She reports the surgery was performed on her L groin on 12/03/17. Reports they thought all of the tumor was removed, but they were going to recheck it 04/2018 and consider chemotherapy at that time. She has not received any chemotherapy, but received radiation in November-January for 28 doses.   Patient also received ra  Hospital Course:  Brief  Narrative: Anita C Thompsonis a 62 year old with past medical history relevant for remote history of cervical cancer status post radiation, hypertension, hyperlipidemia, known left-sided high-grade sarcoma who was admitted with partial small bowel obstruction to [facility] on 02/10/2018 and was noted to have mild AKI and bilateral left greater than right hydronephrosis. She was transferred to [facility] on 02/23/2018 and on 02/12/2018 received bilateral ureteral stentsand also received a venting PEG tube. She was receiving chemotherapy in the hopes that it would relieve some of the obstruction and allow her to use her bowel however her condition is deteriorated dramatically with worsening hypoxia and she was transitioned to comfort care with family on board after discussion with palliative Care. In-hospital death was death isexpected and she wass on a Dilaudid infusion and comfort measures only, Date of Death: 03-18-2018 at 04-Nov-2305 hours,     Plan: 1)Retroperitoneal sarcoma obstruction--  comfort care and IV Dilaudid drip, patient was actively dying, Date of Death: 2018-03-18 at 04-Nov-2305 hours,    2)Dehydration and renal failure- comfort care and IV Dilaudid drip, patient was actively dying, Date of Death: March 18, 2018 at 2305-11-04 hours,     Code Status:DNR Family Communication:  Family at bedside Disposition Plan:  Date of Death: 03-18-2018 at 11-04-2305 hours,    Consultants:  [facility]   Urology  Interventional Radiology  Procedures:  02/09/2018 status post bilateral ureteral stents  02/26/18 IR PICC  7/25/ 11-04-17 IR Gastrostomy   Time: Date of Death: Mar 18, 2018  at 2307 hours  Signed:  Leylani Duley  [facility] 03/23/2018, 4:16 PM

## 2018-04-07 DEATH — deceased

## 2019-02-22 IMAGING — MR MR LUMBAR SPINE WO/W CM
5 of 7 series · 28 of 48 positions shown · IV contrast (multihance)
Comparison: Prior radiograph from earlier the same day.

CLINICAL DATA: Initial evaluation for acute severe left back and
buttock pain.

EXAM:
MRI LUMBAR SPINE WITHOUT AND WITH CONTRAST
MRI PELVIS WITHOUT AND WITH CONTRAST
TECHNIQUE: Multiplanar and multiecho pulse sequences of the lumbar spine were
obtained without and with intravenous contrast.
CONTRAST:  12mL MULTIHANCE GADOBENATE DIMEGLUMINE 529 MG/ML IV SOLN

[Series 3: T2 · sagittal · 4.0mm · 0.55mm/px · 5 of 13 slices shown (1 of 2)]
[im 1/13]
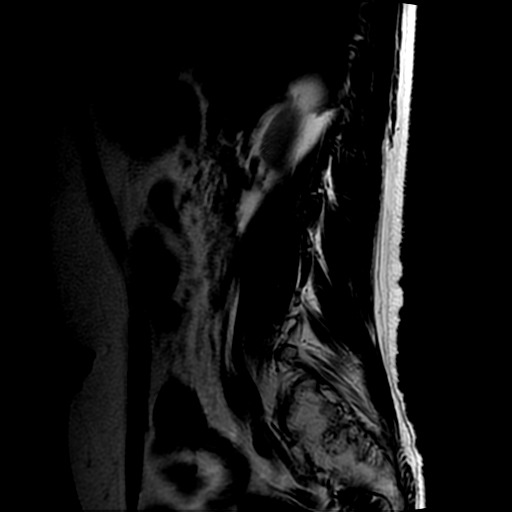
[im 4/13]
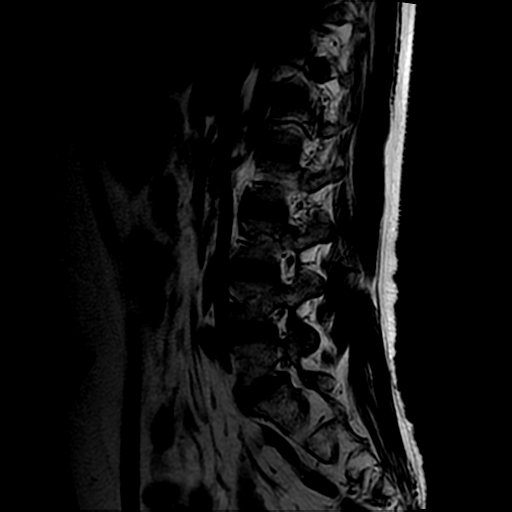
[im 7/13]
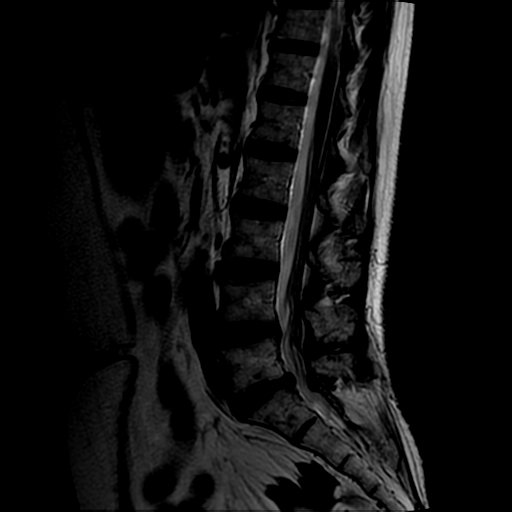
[im 10/13]
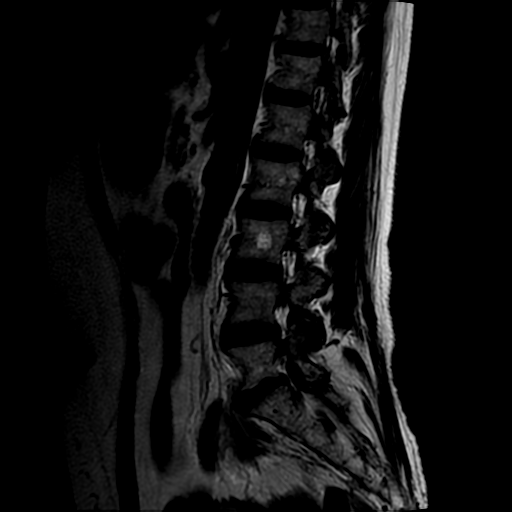
[im 13/13]
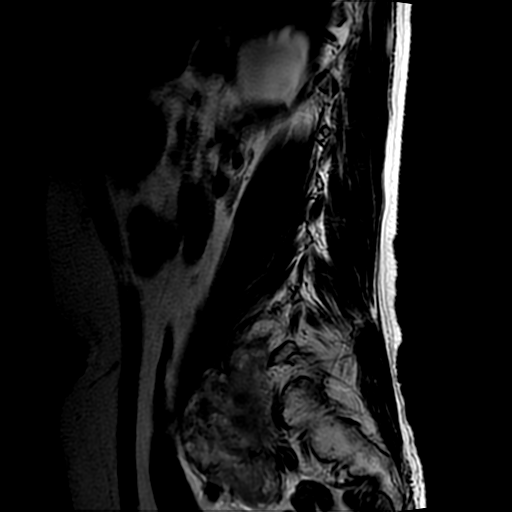

[Series 4: T1 · sagittal · 4.0mm · 0.55mm/px · 5 of 13 slices shown (1 of 2)]
[im 1/13]
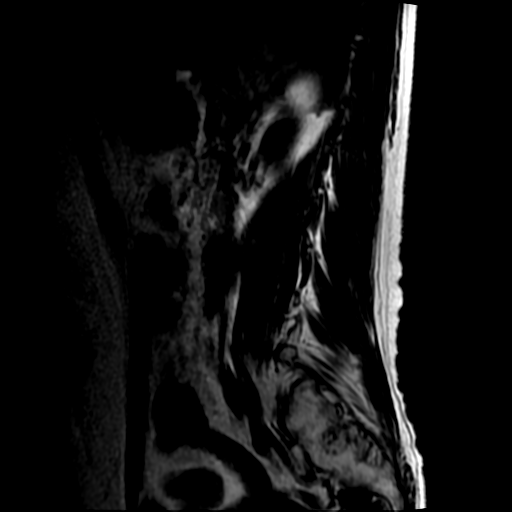
[im 4/13]
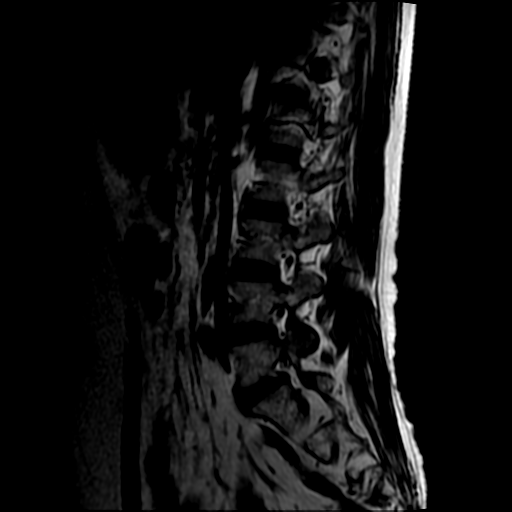
[im 7/13]
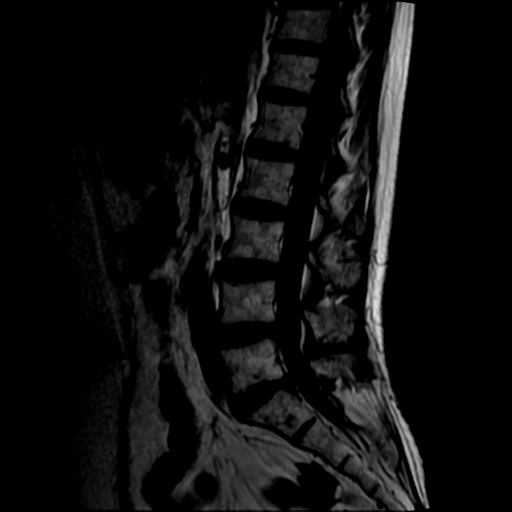
[im 10/13]
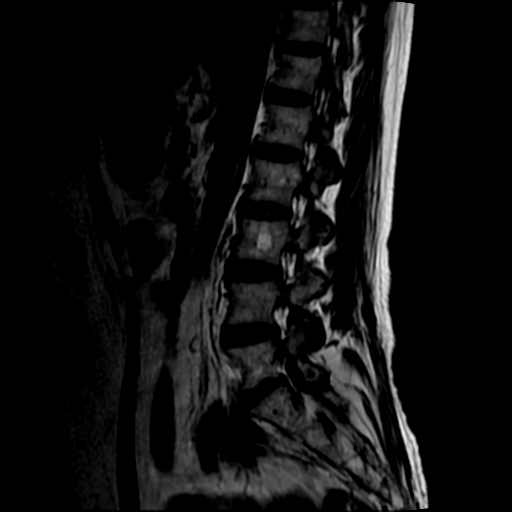
[im 13/13]
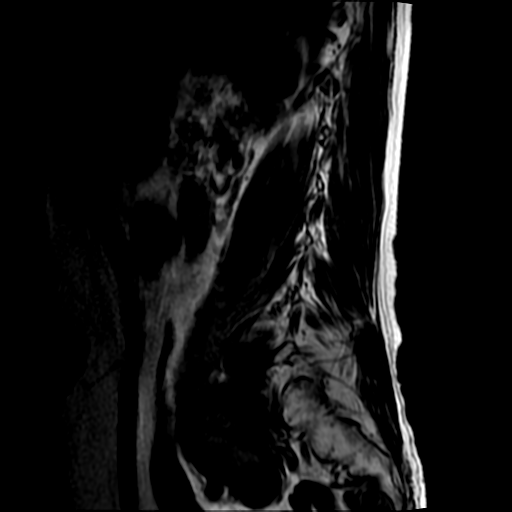

[Series 5: T2 · axial · 4.0mm · 0.78mm/px · z∈[+22,+196]mm · 8 of 33 slices shown (2 of 2)]
[im 1/33]
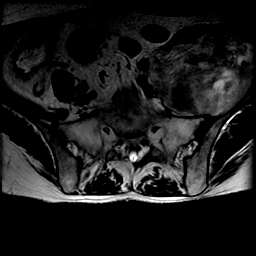
[im 4/33]
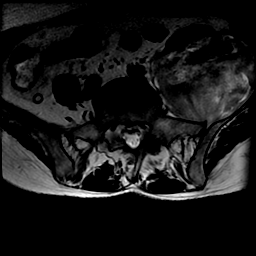
[im 11/33]
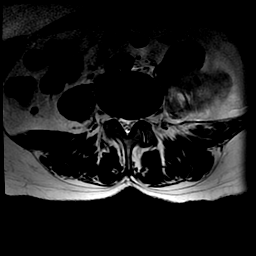
[im 15/33]
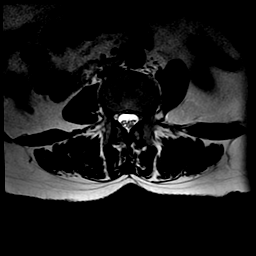
[im 18/33]
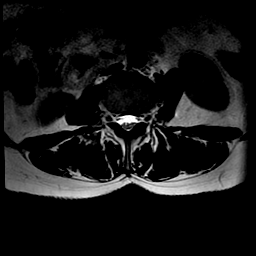
[im 22/33]
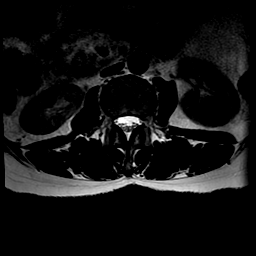
[im 29/33]
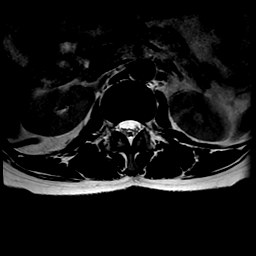
[im 33/33]
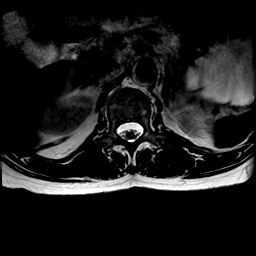

[Series 6: STIR · sagittal · 4.0mm · 0.55mm/px · 2 of 13 slices shown]
[im 1/13]
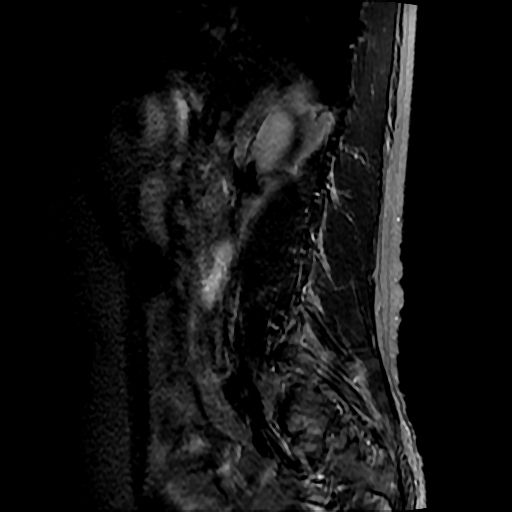
[im 5/13]
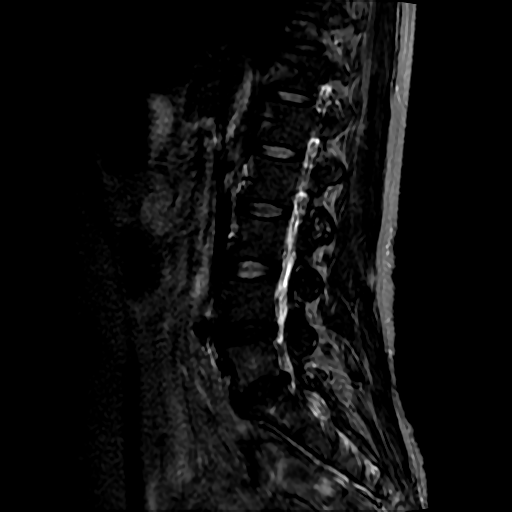

[Series 7: T1 · axial · 4.0mm · 0.78mm/px · z∈[+22,+196]mm · 8 of 33 slices shown (2 of 2)]
[im 1/33]
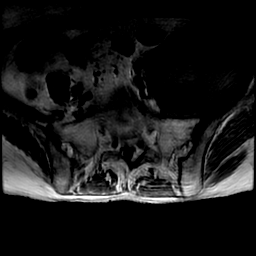
[im 4/33]
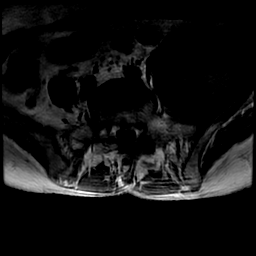
[im 11/33]
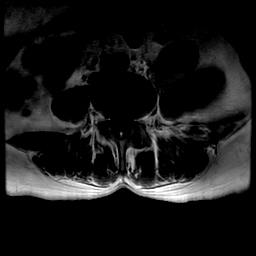
[im 15/33]
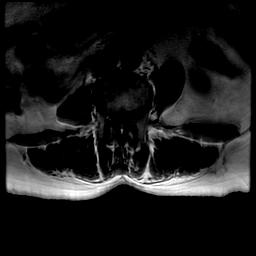
[im 18/33]
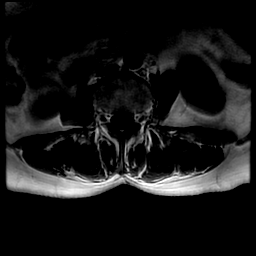
[im 22/33]
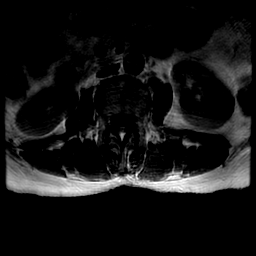
[im 29/33]
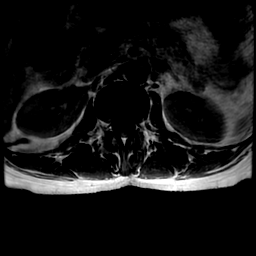
[im 33/33]
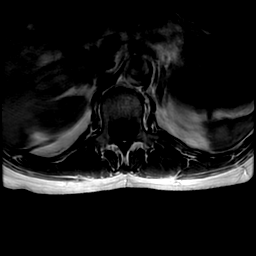

[28 of 48 positions shown; findings below may reference images not displayed]

FINDINGS: MRI LUMBAR SPINE FINDINGS:

Segmentation: Normal segmentation. Lowest well-formed disc labeled
the L5-S1 level.

Alignment: Normal alignment with preservation of the normal lumbar
lordosis. No listhesis.

Vertebrae: Vertebral body heights well maintained. No evidence for
acute or chronic fracture. Bone marrow signal intensity within
normal limits. Benign hemangioma noted within the L3 vertebral body.
No worrisome osseous lesions. No abnormal marrow edema.

Conus medullaris: Extends to the L1-2 level and appears normal.

Paraspinal and other soft tissues: Large heterogeneous enhancing
mass at the left pelvis, better evaluated on concomitant pelvic MRI.
Left psoas muscles displaced anteriorly by the lesion. Paraspinous
soft tissues otherwise within normal limits. T2 hyperintense
subcentimeter cyst noted within the left kidney. Visualized visceral
structures otherwise grossly unremarkable.

Disc levels:

L1-2:  Unremarkable.

L2-3:  Unremarkable.

L3-4:  Normal interspace.  Mild bilateral facet hypertrophy.

L4-5: Diffuse disc bulge with disc desiccation. Superimposed small
central disc protrusion, slightly eccentric to the left. Mild facet
ligamentum flavum hypertrophy. Resultant mild canal with moderate
left lateral recess narrowing. Foramina are patent.

L5-S1: Diffuse disc bulge with disc desiccation and intervertebral
disc space narrowing. Superimposed central disc protrusion with
slight inferior migration. Moderate facet arthrosis, greater on the
left. Resultant mild canal with moderate left subarticular stenosis.
Foramina are patent.

MRI PELVIS FINDINGS:

There is a large ovoid well-circumscribed mass measuring 9.6 x 6.3 x
11.9 cm positioned within the left hemipelvis. Lesion is isointense
to muscle on precontrast T1 sequence, and demonstrates heterogeneous
hyperintense T2 signal intensity, with heterogeneous post-contrast
enhancement. Lesion lies just medial to the left iliac wing at the
level of the left iliacus muscle, and may arise from the iliacus
muscle itself. Left psoas muscle is displaced anteriorly and
medially, and is draped over the mass as it courses inferiorly.
Iliac vessels also displaced medially. No definite invasion of the
underlying left iliac wing.

No other mass lesion within the pelvis.  No adenopathy.

Visualized bowels within normal limits.

Bladder within normal limits. Uterus appears to be absent. Ovaries
not discretely identified.

No free fluid.

Bone marrow signal intensity within normal limits. External soft
tissues demonstrate no other acute abnormality.
IMPRESSION: 1. 9.6 x 6.3 x 11.9 cm heterogeneous mass within the left pelvis at
the level of the left iliacus as above. Primary differential
considerations consist of possible sarcomatous tumor, potentially
arising from the left iliacus muscle. In neurogenic tumor could also
be considered. Further evaluation with histologic sampling
recommended, as this lesion would likely be amenable to percutaneous
sampling.
2. Degenerative disc bulging and facet arthropathy at L4-5 and L5-S1
with resultant mild canal with moderate left subarticular stenosis.
No frank neural impingement within the lumbar spine.

## 2019-02-22 IMAGING — MR MR PELVIS WO/W CM
9 series · 48 of 48 positions shown · IV contrast (multihance)
Comparison: MRI lumbar spine 05/26/2017; radiographs 05/23/2017

CLINICAL DATA: Left back and buttock pain.

EXAM:
MRI PELVIS WITHOUT AND WITH CONTRAST
TECHNIQUE: Multiplanar multisequence MR imaging of the pelvis was performed
both before and after administration of intravenous contrast.
CONTRAST:  12mL MULTIHANCE GADOBENATE DIMEGLUMINE 529 MG/ML IV SOLN

[Series 10: T1 · coronal · 5.0mm · 0.78mm/px · 4 of 30 slices shown (1 of 2)]
[im 1/30]
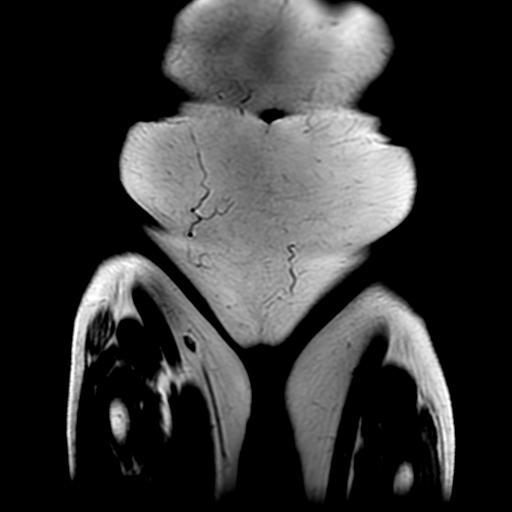
[im 10/30]
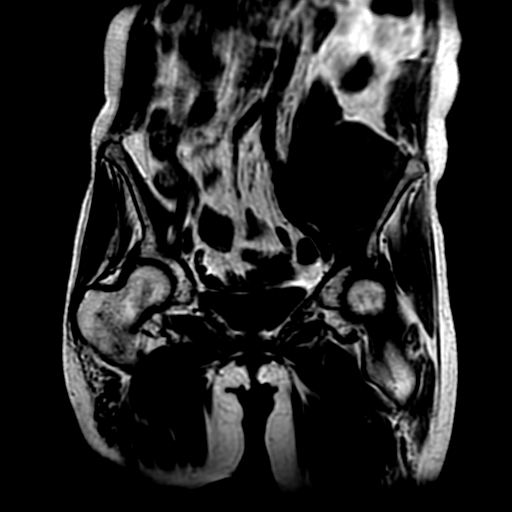
[im 20/30]
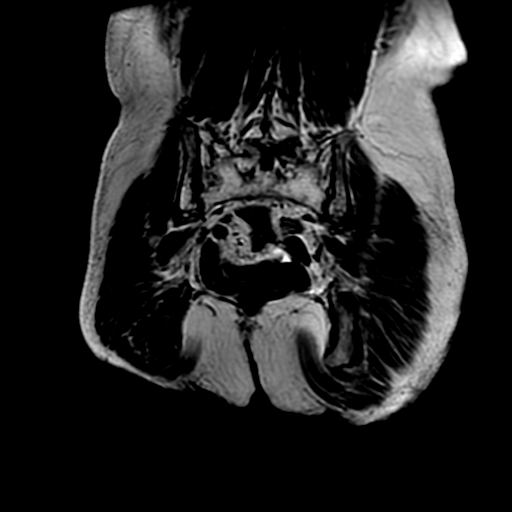
[im 30/30]
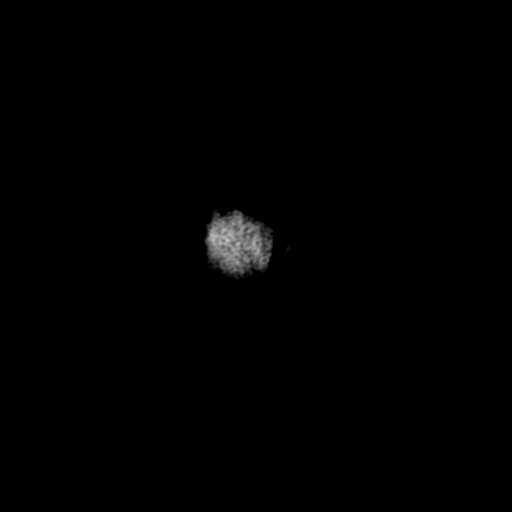

[Series 11: STIR · coronal · 5.0mm · 1.56mm/px · 4 of 30 slices shown]
[im 1/30]
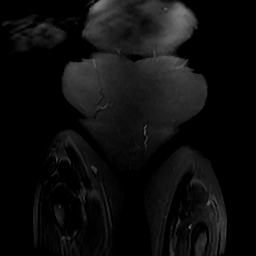
[im 10/30]
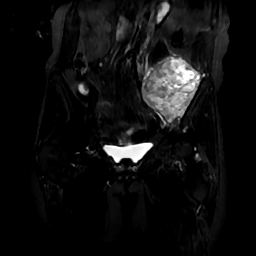
[im 20/30]
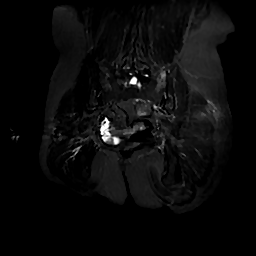
[im 30/30]
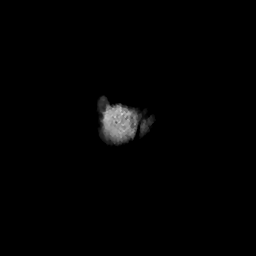

[Series 13: T1 · axial · 5.0mm · 1.33mm/px · z∈[-177,+50]mm · 6 of 39 slices shown (2 of 2)]
[im 1/39]
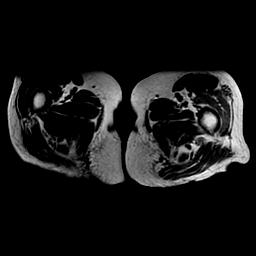
[im 8/39]
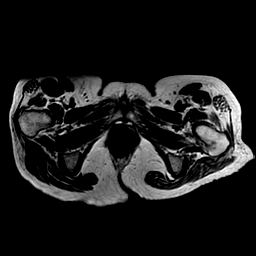
[im 16/39]
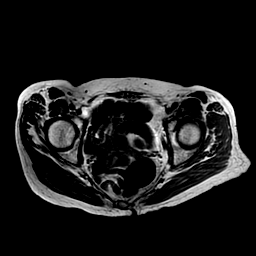
[im 23/39]
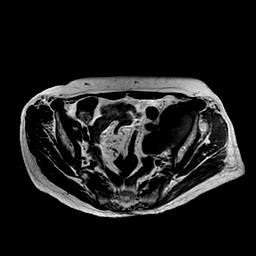
[im 31/39]
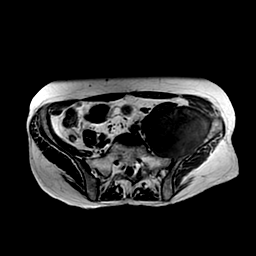
[im 39/39]
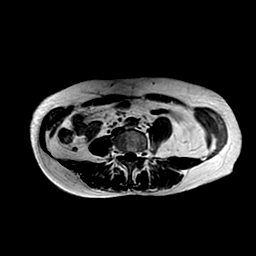

[Series 14: T2 fat-sat · axial · 5.0mm · 1.33mm/px · z∈[-177,+50]mm · 6 of 39 slices shown (1 of 2)]
[im 1/39]
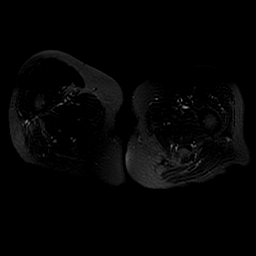
[im 8/39]
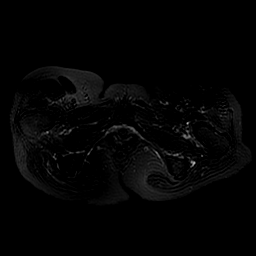
[im 16/39]
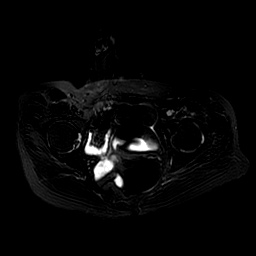
[im 23/39]
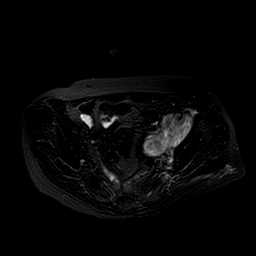
[im 31/39]
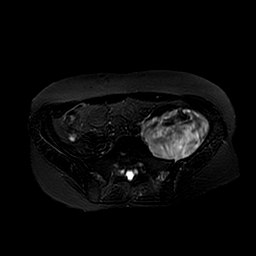
[im 39/39]
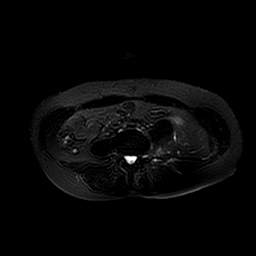

[Series 15: T1 fat-sat · axial · non-contrast · 5.0mm · 1.33mm/px · z∈[-177,+50]mm · 6 of 39 slices shown]
[im 1/39]
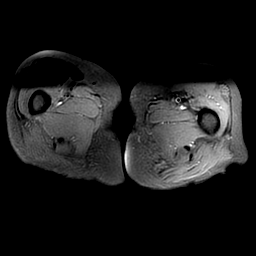
[im 8/39]
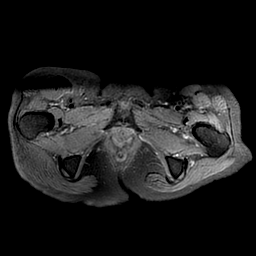
[im 16/39]
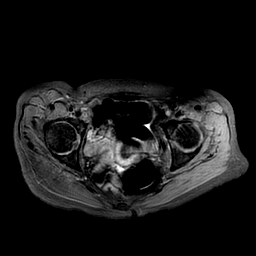
[im 23/39]
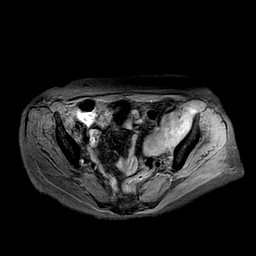
[im 31/39]
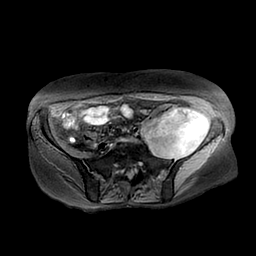
[im 39/39]
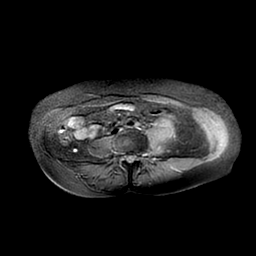

[Series 17: T2 fat-sat · sagittal · 5.0mm · 1.25mm/px · 6 of 43 slices shown (2 of 2)]
[im 1/43]
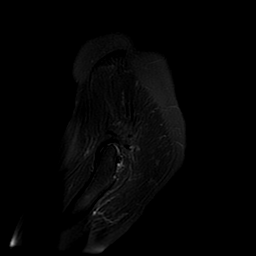
[im 9/43]
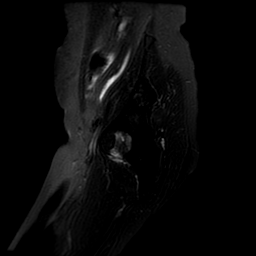
[im 17/43]
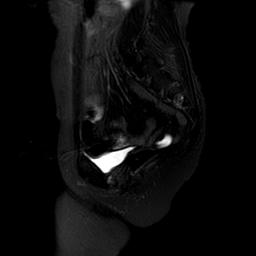
[im 26/43]
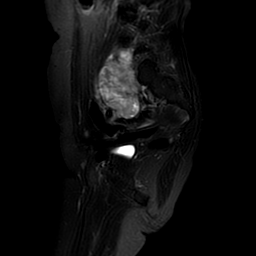
[im 34/43]
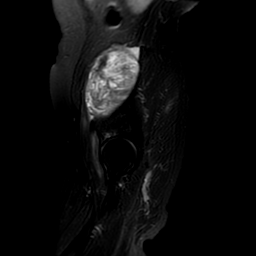
[im 43/43]
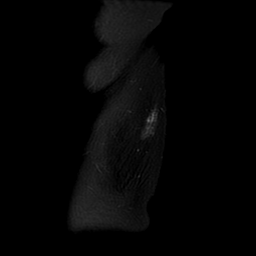

[Series 19: T1 fat-sat post-contrast · axial · 5.0mm · 1.33mm/px · z∈[-177,+50]mm · 6 of 39 slices shown (1 of 3)]
[im 1/39]
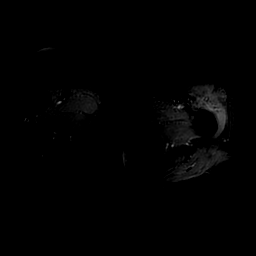
[im 8/39]
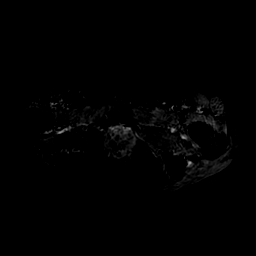
[im 16/39]
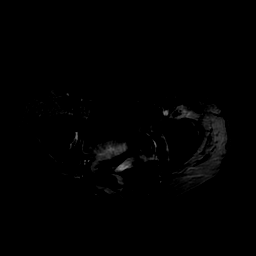
[im 23/39]
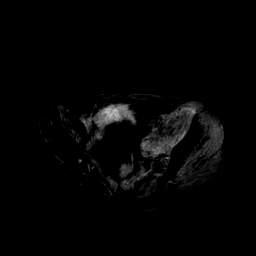
[im 31/39]
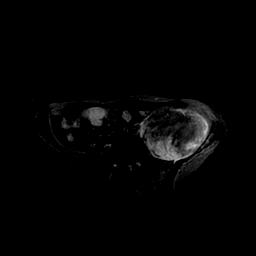
[im 39/39]
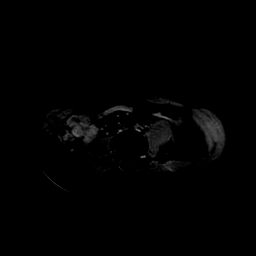

[Series 20: T1 fat-sat post-contrast · coronal · 5.0mm · 1.56mm/px · 4 of 30 slices shown (2 of 3)]
[im 1/30]
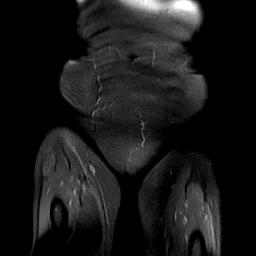
[im 10/30]
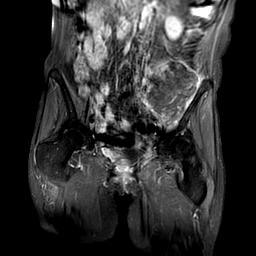
[im 20/30]
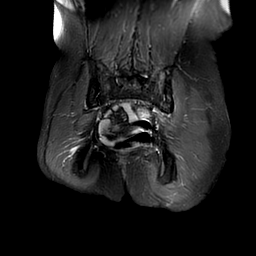
[im 30/30]
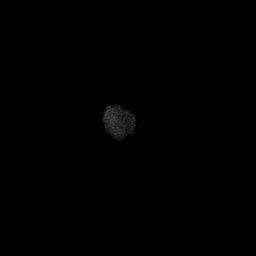

[Series 21: T1 fat-sat post-contrast · sagittal · 5.0mm · 1.25mm/px · 6 of 43 slices shown (3 of 3)]
[im 1/43]
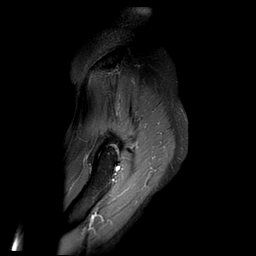
[im 9/43]
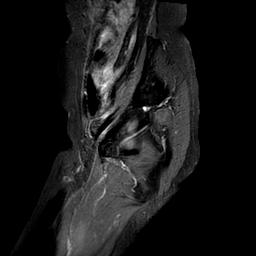
[im 17/43]
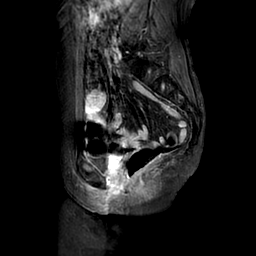
[im 26/43]
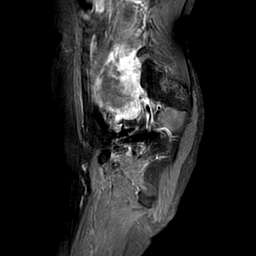
[im 34/43]
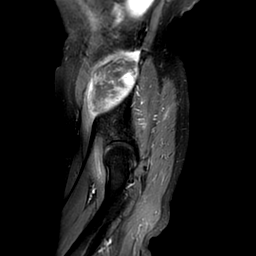
[im 43/43]
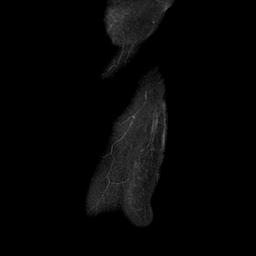

[48 of 48 positions shown; findings below may reference images not displayed]

FINDINGS: Urinary Tract:  Unremarkable

Bowel:  Unremarkable

Vascular/Lymphatic: The left-sided mass abuts and displaces the left
internal and external iliac vessels medially.

Reproductive: Uterus absent. No specific adnexal abnormality is
identified.

Other:  No supplemental non-categorized findings.

Musculoskeletal: An 11.4 by 6.3 by 8.9 cm mass is present deep to
the iliacus and lower psoas muscle on the left. Suspected early
invasion of the left upper iliac bone with low-grade edema and
enhancement in the marrow. Possible invasion of the upper left SI
joint. The mass exerts mass effect on the left L4 and L5 spinal
nerves and extends down along the iloipsoas anterior to the left new
acetabulum. The mass has intermediate precontrast T1 signal and
heterogeneous but primarily high a T2 signal, and demonstrates
definite enhancement new especially along its margins new, likely
with some central necrosis.
IMPRESSION: 1. Large enhancing mass deep to the left iliacus and lower psoas
muscles. Sarcoma is the top differential diagnostic consideration.
Presumably an unusual neurofibroma or schwannoma might have a
similar appearance, but is considered less likely. I am skeptical
that this represents a large abscess related to left sacroiliac
septic arthritis given the involvement of the only the upper margin
of the SI joint rather than the entire SI joint, but correlation
with any fever/leukocytosis is suggested. The mass medially
displaces the adjacent iliac vessels and demonstrates likely early
invasion of the left upper iliac bone and possible invasion of the
left upper SI joint.
